# Patient Record
Sex: Female | Born: 1935
Health system: Southern US, Community
[De-identification: ages and names within clinical notes are randomized; demographics above are authoritative.]

## PROBLEM LIST (undated history)

## (undated) DIAGNOSIS — I639 Cerebral infarction, unspecified: Secondary | ICD-10-CM

## (undated) DIAGNOSIS — F0280 Dementia in other diseases classified elsewhere without behavioral disturbance: Secondary | ICD-10-CM

## (undated) DIAGNOSIS — I10 Essential (primary) hypertension: Secondary | ICD-10-CM

## (undated) DIAGNOSIS — F419 Anxiety disorder, unspecified: Secondary | ICD-10-CM

---

## 2010-07-06 ENCOUNTER — Encounter
Admission: RE | Admit: 2010-07-06 | Discharge: 2010-07-06 | Payer: Self-pay | Source: Home / Self Care | Attending: Obstetrics and Gynecology | Admitting: Obstetrics and Gynecology

## 2014-08-30 ENCOUNTER — Encounter (INDEPENDENT_AMBULATORY_CARE_PROVIDER_SITE_OTHER): Payer: Self-pay | Admitting: Ophthalmology

## 2014-10-25 ENCOUNTER — Encounter (INDEPENDENT_AMBULATORY_CARE_PROVIDER_SITE_OTHER): Payer: Self-pay | Admitting: Ophthalmology

## 2014-11-01 ENCOUNTER — Encounter (INDEPENDENT_AMBULATORY_CARE_PROVIDER_SITE_OTHER): Payer: Medicare Other | Admitting: Ophthalmology

## 2014-11-01 DIAGNOSIS — I10 Essential (primary) hypertension: Secondary | ICD-10-CM | POA: Diagnosis not present

## 2014-11-01 DIAGNOSIS — H43813 Vitreous degeneration, bilateral: Secondary | ICD-10-CM | POA: Diagnosis not present

## 2014-11-01 DIAGNOSIS — H35033 Hypertensive retinopathy, bilateral: Secondary | ICD-10-CM | POA: Diagnosis not present

## 2014-11-01 DIAGNOSIS — H34832 Tributary (branch) retinal vein occlusion, left eye: Secondary | ICD-10-CM | POA: Diagnosis not present

## 2014-11-01 DIAGNOSIS — D3131 Benign neoplasm of right choroid: Secondary | ICD-10-CM | POA: Diagnosis not present

## 2014-11-11 ENCOUNTER — Encounter (INDEPENDENT_AMBULATORY_CARE_PROVIDER_SITE_OTHER): Payer: Medicare Other | Admitting: Ophthalmology

## 2014-11-11 DIAGNOSIS — H34832 Tributary (branch) retinal vein occlusion, left eye: Secondary | ICD-10-CM | POA: Diagnosis not present

## 2014-11-11 DIAGNOSIS — H35032 Hypertensive retinopathy, left eye: Secondary | ICD-10-CM

## 2014-11-11 DIAGNOSIS — I10 Essential (primary) hypertension: Secondary | ICD-10-CM | POA: Diagnosis not present

## 2014-11-11 DIAGNOSIS — H43812 Vitreous degeneration, left eye: Secondary | ICD-10-CM

## 2015-03-14 ENCOUNTER — Ambulatory Visit (INDEPENDENT_AMBULATORY_CARE_PROVIDER_SITE_OTHER): Payer: Medicare Other | Admitting: Ophthalmology

## 2015-09-13 DIAGNOSIS — M810 Age-related osteoporosis without current pathological fracture: Secondary | ICD-10-CM | POA: Diagnosis not present

## 2015-09-22 DIAGNOSIS — Z789 Other specified health status: Secondary | ICD-10-CM | POA: Diagnosis not present

## 2015-09-22 DIAGNOSIS — Z299 Encounter for prophylactic measures, unspecified: Secondary | ICD-10-CM | POA: Diagnosis not present

## 2015-09-22 DIAGNOSIS — I10 Essential (primary) hypertension: Secondary | ICD-10-CM | POA: Diagnosis not present

## 2015-11-19 DIAGNOSIS — B029 Zoster without complications: Secondary | ICD-10-CM | POA: Diagnosis not present

## 2015-11-21 DIAGNOSIS — B029 Zoster without complications: Secondary | ICD-10-CM | POA: Diagnosis not present

## 2015-11-21 DIAGNOSIS — Z299 Encounter for prophylactic measures, unspecified: Secondary | ICD-10-CM | POA: Diagnosis not present

## 2016-01-04 DIAGNOSIS — Z299 Encounter for prophylactic measures, unspecified: Secondary | ICD-10-CM | POA: Diagnosis not present

## 2016-01-04 DIAGNOSIS — Z1211 Encounter for screening for malignant neoplasm of colon: Secondary | ICD-10-CM | POA: Diagnosis not present

## 2016-01-04 DIAGNOSIS — Z79899 Other long term (current) drug therapy: Secondary | ICD-10-CM | POA: Diagnosis not present

## 2016-01-04 DIAGNOSIS — Z6824 Body mass index (BMI) 24.0-24.9, adult: Secondary | ICD-10-CM | POA: Diagnosis not present

## 2016-01-04 DIAGNOSIS — Z0000 Encounter for general adult medical examination without abnormal findings: Secondary | ICD-10-CM | POA: Diagnosis not present

## 2016-01-04 DIAGNOSIS — Z7189 Other specified counseling: Secondary | ICD-10-CM | POA: Diagnosis not present

## 2016-01-04 DIAGNOSIS — R5383 Other fatigue: Secondary | ICD-10-CM | POA: Diagnosis not present

## 2016-01-04 DIAGNOSIS — D649 Anemia, unspecified: Secondary | ICD-10-CM | POA: Diagnosis not present

## 2016-01-04 DIAGNOSIS — Z1389 Encounter for screening for other disorder: Secondary | ICD-10-CM | POA: Diagnosis not present

## 2016-01-04 DIAGNOSIS — I10 Essential (primary) hypertension: Secondary | ICD-10-CM | POA: Diagnosis not present

## 2016-01-11 DIAGNOSIS — Z299 Encounter for prophylactic measures, unspecified: Secondary | ICD-10-CM | POA: Diagnosis not present

## 2016-01-11 DIAGNOSIS — R7303 Prediabetes: Secondary | ICD-10-CM | POA: Diagnosis not present

## 2016-01-11 DIAGNOSIS — E781 Pure hyperglyceridemia: Secondary | ICD-10-CM | POA: Diagnosis not present

## 2016-01-11 DIAGNOSIS — N289 Disorder of kidney and ureter, unspecified: Secondary | ICD-10-CM | POA: Diagnosis not present

## 2016-01-12 DIAGNOSIS — D3130 Benign neoplasm of unspecified choroid: Secondary | ICD-10-CM | POA: Diagnosis not present

## 2016-01-20 DIAGNOSIS — Z1231 Encounter for screening mammogram for malignant neoplasm of breast: Secondary | ICD-10-CM | POA: Diagnosis not present

## 2016-03-14 ENCOUNTER — Ambulatory Visit (INDEPENDENT_AMBULATORY_CARE_PROVIDER_SITE_OTHER): Payer: Medicare Other | Admitting: Ophthalmology

## 2016-03-29 ENCOUNTER — Ambulatory Visit (INDEPENDENT_AMBULATORY_CARE_PROVIDER_SITE_OTHER): Payer: Medicare Other | Admitting: Ophthalmology

## 2016-03-29 DIAGNOSIS — D3131 Benign neoplasm of right choroid: Secondary | ICD-10-CM

## 2016-03-29 DIAGNOSIS — H348322 Tributary (branch) retinal vein occlusion, left eye, stable: Secondary | ICD-10-CM | POA: Diagnosis not present

## 2016-03-29 DIAGNOSIS — H35033 Hypertensive retinopathy, bilateral: Secondary | ICD-10-CM | POA: Diagnosis not present

## 2016-03-29 DIAGNOSIS — H43813 Vitreous degeneration, bilateral: Secondary | ICD-10-CM | POA: Diagnosis not present

## 2016-03-29 DIAGNOSIS — I10 Essential (primary) hypertension: Secondary | ICD-10-CM | POA: Diagnosis not present

## 2016-04-05 DIAGNOSIS — Z90710 Acquired absence of both cervix and uterus: Secondary | ICD-10-CM | POA: Diagnosis not present

## 2016-04-05 DIAGNOSIS — I10 Essential (primary) hypertension: Secondary | ICD-10-CM | POA: Diagnosis not present

## 2016-04-05 DIAGNOSIS — E119 Type 2 diabetes mellitus without complications: Secondary | ICD-10-CM | POA: Diagnosis not present

## 2016-04-05 DIAGNOSIS — E7800 Pure hypercholesterolemia, unspecified: Secondary | ICD-10-CM | POA: Diagnosis not present

## 2016-07-10 DIAGNOSIS — M810 Age-related osteoporosis without current pathological fracture: Secondary | ICD-10-CM | POA: Diagnosis not present

## 2016-07-10 DIAGNOSIS — Z789 Other specified health status: Secondary | ICD-10-CM | POA: Diagnosis not present

## 2016-07-10 DIAGNOSIS — I10 Essential (primary) hypertension: Secondary | ICD-10-CM | POA: Diagnosis not present

## 2016-07-10 DIAGNOSIS — Z299 Encounter for prophylactic measures, unspecified: Secondary | ICD-10-CM | POA: Diagnosis not present

## 2016-07-10 DIAGNOSIS — E119 Type 2 diabetes mellitus without complications: Secondary | ICD-10-CM | POA: Diagnosis not present

## 2016-07-10 DIAGNOSIS — Z6824 Body mass index (BMI) 24.0-24.9, adult: Secondary | ICD-10-CM | POA: Diagnosis not present

## 2016-08-06 DIAGNOSIS — L250 Unspecified contact dermatitis due to cosmetics: Secondary | ICD-10-CM | POA: Diagnosis not present

## 2016-08-08 DIAGNOSIS — I10 Essential (primary) hypertension: Secondary | ICD-10-CM | POA: Diagnosis not present

## 2016-08-08 DIAGNOSIS — M810 Age-related osteoporosis without current pathological fracture: Secondary | ICD-10-CM | POA: Diagnosis not present

## 2016-08-08 DIAGNOSIS — E119 Type 2 diabetes mellitus without complications: Secondary | ICD-10-CM | POA: Diagnosis not present

## 2016-10-02 DIAGNOSIS — E119 Type 2 diabetes mellitus without complications: Secondary | ICD-10-CM | POA: Diagnosis not present

## 2016-10-02 DIAGNOSIS — I10 Essential (primary) hypertension: Secondary | ICD-10-CM | POA: Diagnosis not present

## 2016-10-02 DIAGNOSIS — M810 Age-related osteoporosis without current pathological fracture: Secondary | ICD-10-CM | POA: Diagnosis not present

## 2016-10-30 DIAGNOSIS — I10 Essential (primary) hypertension: Secondary | ICD-10-CM | POA: Diagnosis not present

## 2016-10-30 DIAGNOSIS — E119 Type 2 diabetes mellitus without complications: Secondary | ICD-10-CM | POA: Diagnosis not present

## 2016-10-30 DIAGNOSIS — M810 Age-related osteoporosis without current pathological fracture: Secondary | ICD-10-CM | POA: Diagnosis not present

## 2016-12-13 DIAGNOSIS — E119 Type 2 diabetes mellitus without complications: Secondary | ICD-10-CM | POA: Diagnosis not present

## 2016-12-13 DIAGNOSIS — I10 Essential (primary) hypertension: Secondary | ICD-10-CM | POA: Diagnosis not present

## 2016-12-13 DIAGNOSIS — M810 Age-related osteoporosis without current pathological fracture: Secondary | ICD-10-CM | POA: Diagnosis not present

## 2017-01-07 DIAGNOSIS — R5383 Other fatigue: Secondary | ICD-10-CM | POA: Diagnosis not present

## 2017-01-07 DIAGNOSIS — E119 Type 2 diabetes mellitus without complications: Secondary | ICD-10-CM | POA: Diagnosis not present

## 2017-01-07 DIAGNOSIS — Z7189 Other specified counseling: Secondary | ICD-10-CM | POA: Diagnosis not present

## 2017-01-07 DIAGNOSIS — Z0000 Encounter for general adult medical examination without abnormal findings: Secondary | ICD-10-CM | POA: Diagnosis not present

## 2017-01-07 DIAGNOSIS — Z1231 Encounter for screening mammogram for malignant neoplasm of breast: Secondary | ICD-10-CM | POA: Diagnosis not present

## 2017-01-07 DIAGNOSIS — Z1389 Encounter for screening for other disorder: Secondary | ICD-10-CM | POA: Diagnosis not present

## 2017-01-07 DIAGNOSIS — Z1211 Encounter for screening for malignant neoplasm of colon: Secondary | ICD-10-CM | POA: Diagnosis not present

## 2017-01-07 DIAGNOSIS — Z299 Encounter for prophylactic measures, unspecified: Secondary | ICD-10-CM | POA: Diagnosis not present

## 2017-01-07 DIAGNOSIS — E7800 Pure hypercholesterolemia, unspecified: Secondary | ICD-10-CM | POA: Diagnosis not present

## 2017-01-07 DIAGNOSIS — Z79899 Other long term (current) drug therapy: Secondary | ICD-10-CM | POA: Diagnosis not present

## 2017-01-07 DIAGNOSIS — I10 Essential (primary) hypertension: Secondary | ICD-10-CM | POA: Diagnosis not present

## 2017-01-07 DIAGNOSIS — E663 Overweight: Secondary | ICD-10-CM | POA: Diagnosis not present

## 2017-01-08 DIAGNOSIS — E663 Overweight: Secondary | ICD-10-CM | POA: Diagnosis not present

## 2017-01-08 DIAGNOSIS — E7800 Pure hypercholesterolemia, unspecified: Secondary | ICD-10-CM | POA: Diagnosis not present

## 2017-01-08 DIAGNOSIS — I10 Essential (primary) hypertension: Secondary | ICD-10-CM | POA: Diagnosis not present

## 2017-01-08 DIAGNOSIS — M810 Age-related osteoporosis without current pathological fracture: Secondary | ICD-10-CM | POA: Diagnosis not present

## 2017-01-08 DIAGNOSIS — N183 Chronic kidney disease, stage 3 (moderate): Secondary | ICD-10-CM | POA: Diagnosis not present

## 2017-01-08 DIAGNOSIS — Z299 Encounter for prophylactic measures, unspecified: Secondary | ICD-10-CM | POA: Diagnosis not present

## 2017-01-08 DIAGNOSIS — E119 Type 2 diabetes mellitus without complications: Secondary | ICD-10-CM | POA: Diagnosis not present

## 2017-01-17 DIAGNOSIS — I10 Essential (primary) hypertension: Secondary | ICD-10-CM | POA: Diagnosis not present

## 2017-01-17 DIAGNOSIS — M810 Age-related osteoporosis without current pathological fracture: Secondary | ICD-10-CM | POA: Diagnosis not present

## 2017-01-17 DIAGNOSIS — E119 Type 2 diabetes mellitus without complications: Secondary | ICD-10-CM | POA: Diagnosis not present

## 2017-02-05 DIAGNOSIS — N183 Chronic kidney disease, stage 3 (moderate): Secondary | ICD-10-CM | POA: Diagnosis not present

## 2017-02-25 DIAGNOSIS — I10 Essential (primary) hypertension: Secondary | ICD-10-CM | POA: Diagnosis not present

## 2017-02-25 DIAGNOSIS — M810 Age-related osteoporosis without current pathological fracture: Secondary | ICD-10-CM | POA: Diagnosis not present

## 2017-02-25 DIAGNOSIS — E119 Type 2 diabetes mellitus without complications: Secondary | ICD-10-CM | POA: Diagnosis not present

## 2017-03-13 DIAGNOSIS — M810 Age-related osteoporosis without current pathological fracture: Secondary | ICD-10-CM | POA: Diagnosis not present

## 2017-03-13 DIAGNOSIS — I10 Essential (primary) hypertension: Secondary | ICD-10-CM | POA: Diagnosis not present

## 2017-03-13 DIAGNOSIS — E119 Type 2 diabetes mellitus without complications: Secondary | ICD-10-CM | POA: Diagnosis not present

## 2017-03-25 DIAGNOSIS — Z1231 Encounter for screening mammogram for malignant neoplasm of breast: Secondary | ICD-10-CM | POA: Diagnosis not present

## 2017-04-08 ENCOUNTER — Ambulatory Visit (INDEPENDENT_AMBULATORY_CARE_PROVIDER_SITE_OTHER): Payer: Medicare Other | Admitting: Ophthalmology

## 2017-04-08 DIAGNOSIS — H34832 Tributary (branch) retinal vein occlusion, left eye, with macular edema: Secondary | ICD-10-CM | POA: Diagnosis not present

## 2017-04-08 DIAGNOSIS — H43813 Vitreous degeneration, bilateral: Secondary | ICD-10-CM | POA: Diagnosis not present

## 2017-04-08 DIAGNOSIS — I10 Essential (primary) hypertension: Secondary | ICD-10-CM

## 2017-04-08 DIAGNOSIS — D3131 Benign neoplasm of right choroid: Secondary | ICD-10-CM | POA: Diagnosis not present

## 2017-04-08 DIAGNOSIS — H353111 Nonexudative age-related macular degeneration, right eye, early dry stage: Secondary | ICD-10-CM | POA: Diagnosis not present

## 2017-04-08 DIAGNOSIS — H35033 Hypertensive retinopathy, bilateral: Secondary | ICD-10-CM | POA: Diagnosis not present

## 2017-04-15 DIAGNOSIS — E7800 Pure hypercholesterolemia, unspecified: Secondary | ICD-10-CM | POA: Diagnosis not present

## 2017-04-15 DIAGNOSIS — E1165 Type 2 diabetes mellitus with hyperglycemia: Secondary | ICD-10-CM | POA: Diagnosis not present

## 2017-04-15 DIAGNOSIS — N183 Chronic kidney disease, stage 3 (moderate): Secondary | ICD-10-CM | POA: Diagnosis not present

## 2017-04-15 DIAGNOSIS — Z299 Encounter for prophylactic measures, unspecified: Secondary | ICD-10-CM | POA: Diagnosis not present

## 2017-04-15 DIAGNOSIS — Z6826 Body mass index (BMI) 26.0-26.9, adult: Secondary | ICD-10-CM | POA: Diagnosis not present

## 2017-04-15 DIAGNOSIS — I10 Essential (primary) hypertension: Secondary | ICD-10-CM | POA: Diagnosis not present

## 2017-04-15 DIAGNOSIS — M810 Age-related osteoporosis without current pathological fracture: Secondary | ICD-10-CM | POA: Diagnosis not present

## 2017-04-25 DIAGNOSIS — Z23 Encounter for immunization: Secondary | ICD-10-CM | POA: Diagnosis not present

## 2017-05-10 DIAGNOSIS — M810 Age-related osteoporosis without current pathological fracture: Secondary | ICD-10-CM | POA: Diagnosis not present

## 2017-05-10 DIAGNOSIS — I10 Essential (primary) hypertension: Secondary | ICD-10-CM | POA: Diagnosis not present

## 2017-05-10 DIAGNOSIS — E119 Type 2 diabetes mellitus without complications: Secondary | ICD-10-CM | POA: Diagnosis not present

## 2017-06-04 DIAGNOSIS — E119 Type 2 diabetes mellitus without complications: Secondary | ICD-10-CM | POA: Diagnosis not present

## 2017-06-04 DIAGNOSIS — M810 Age-related osteoporosis without current pathological fracture: Secondary | ICD-10-CM | POA: Diagnosis not present

## 2017-06-04 DIAGNOSIS — I10 Essential (primary) hypertension: Secondary | ICD-10-CM | POA: Diagnosis not present

## 2017-07-04 DIAGNOSIS — M810 Age-related osteoporosis without current pathological fracture: Secondary | ICD-10-CM | POA: Diagnosis not present

## 2017-07-04 DIAGNOSIS — I10 Essential (primary) hypertension: Secondary | ICD-10-CM | POA: Diagnosis not present

## 2017-07-04 DIAGNOSIS — E119 Type 2 diabetes mellitus without complications: Secondary | ICD-10-CM | POA: Diagnosis not present

## 2017-07-22 DIAGNOSIS — Z6826 Body mass index (BMI) 26.0-26.9, adult: Secondary | ICD-10-CM | POA: Diagnosis not present

## 2017-07-22 DIAGNOSIS — E1165 Type 2 diabetes mellitus with hyperglycemia: Secondary | ICD-10-CM | POA: Diagnosis not present

## 2017-07-22 DIAGNOSIS — Z299 Encounter for prophylactic measures, unspecified: Secondary | ICD-10-CM | POA: Diagnosis not present

## 2017-07-22 DIAGNOSIS — E1122 Type 2 diabetes mellitus with diabetic chronic kidney disease: Secondary | ICD-10-CM | POA: Diagnosis not present

## 2017-07-22 DIAGNOSIS — I10 Essential (primary) hypertension: Secondary | ICD-10-CM | POA: Diagnosis not present

## 2017-07-22 DIAGNOSIS — N183 Chronic kidney disease, stage 3 (moderate): Secondary | ICD-10-CM | POA: Diagnosis not present

## 2017-07-30 DIAGNOSIS — I10 Essential (primary) hypertension: Secondary | ICD-10-CM | POA: Diagnosis not present

## 2017-07-30 DIAGNOSIS — Z6827 Body mass index (BMI) 27.0-27.9, adult: Secondary | ICD-10-CM | POA: Diagnosis not present

## 2017-07-30 DIAGNOSIS — Z299 Encounter for prophylactic measures, unspecified: Secondary | ICD-10-CM | POA: Diagnosis not present

## 2017-07-30 DIAGNOSIS — Z789 Other specified health status: Secondary | ICD-10-CM | POA: Diagnosis not present

## 2017-07-30 DIAGNOSIS — E7800 Pure hypercholesterolemia, unspecified: Secondary | ICD-10-CM | POA: Diagnosis not present

## 2017-07-30 DIAGNOSIS — N183 Chronic kidney disease, stage 3 (moderate): Secondary | ICD-10-CM | POA: Diagnosis not present

## 2017-07-30 DIAGNOSIS — E1122 Type 2 diabetes mellitus with diabetic chronic kidney disease: Secondary | ICD-10-CM | POA: Diagnosis not present

## 2017-07-30 DIAGNOSIS — J320 Chronic maxillary sinusitis: Secondary | ICD-10-CM | POA: Diagnosis not present

## 2017-08-05 DIAGNOSIS — E119 Type 2 diabetes mellitus without complications: Secondary | ICD-10-CM | POA: Diagnosis not present

## 2017-08-05 DIAGNOSIS — I10 Essential (primary) hypertension: Secondary | ICD-10-CM | POA: Diagnosis not present

## 2017-08-05 DIAGNOSIS — M810 Age-related osteoporosis without current pathological fracture: Secondary | ICD-10-CM | POA: Diagnosis not present

## 2017-09-03 DIAGNOSIS — E119 Type 2 diabetes mellitus without complications: Secondary | ICD-10-CM | POA: Diagnosis not present

## 2017-09-03 DIAGNOSIS — M810 Age-related osteoporosis without current pathological fracture: Secondary | ICD-10-CM | POA: Diagnosis not present

## 2017-09-03 DIAGNOSIS — I10 Essential (primary) hypertension: Secondary | ICD-10-CM | POA: Diagnosis not present

## 2017-09-30 DIAGNOSIS — M810 Age-related osteoporosis without current pathological fracture: Secondary | ICD-10-CM | POA: Diagnosis not present

## 2017-09-30 DIAGNOSIS — I10 Essential (primary) hypertension: Secondary | ICD-10-CM | POA: Diagnosis not present

## 2017-09-30 DIAGNOSIS — E119 Type 2 diabetes mellitus without complications: Secondary | ICD-10-CM | POA: Diagnosis not present

## 2017-10-24 DIAGNOSIS — I10 Essential (primary) hypertension: Secondary | ICD-10-CM | POA: Diagnosis not present

## 2017-10-24 DIAGNOSIS — J069 Acute upper respiratory infection, unspecified: Secondary | ICD-10-CM | POA: Diagnosis not present

## 2017-10-24 DIAGNOSIS — Z299 Encounter for prophylactic measures, unspecified: Secondary | ICD-10-CM | POA: Diagnosis not present

## 2017-10-24 DIAGNOSIS — Z789 Other specified health status: Secondary | ICD-10-CM | POA: Diagnosis not present

## 2017-10-24 DIAGNOSIS — Z6827 Body mass index (BMI) 27.0-27.9, adult: Secondary | ICD-10-CM | POA: Diagnosis not present

## 2017-11-28 DIAGNOSIS — I10 Essential (primary) hypertension: Secondary | ICD-10-CM | POA: Diagnosis not present

## 2017-11-28 DIAGNOSIS — E119 Type 2 diabetes mellitus without complications: Secondary | ICD-10-CM | POA: Diagnosis not present

## 2017-11-28 DIAGNOSIS — M810 Age-related osteoporosis without current pathological fracture: Secondary | ICD-10-CM | POA: Diagnosis not present

## 2017-12-27 DIAGNOSIS — I10 Essential (primary) hypertension: Secondary | ICD-10-CM | POA: Diagnosis not present

## 2017-12-27 DIAGNOSIS — Z79899 Other long term (current) drug therapy: Secondary | ICD-10-CM | POA: Diagnosis not present

## 2017-12-27 DIAGNOSIS — E7800 Pure hypercholesterolemia, unspecified: Secondary | ICD-10-CM | POA: Diagnosis not present

## 2017-12-27 DIAGNOSIS — G4489 Other headache syndrome: Secondary | ICD-10-CM | POA: Diagnosis not present

## 2017-12-27 DIAGNOSIS — R52 Pain, unspecified: Secondary | ICD-10-CM | POA: Diagnosis not present

## 2017-12-27 DIAGNOSIS — Z886 Allergy status to analgesic agent status: Secondary | ICD-10-CM | POA: Diagnosis not present

## 2017-12-27 DIAGNOSIS — R29818 Other symptoms and signs involving the nervous system: Secondary | ICD-10-CM | POA: Diagnosis not present

## 2017-12-27 DIAGNOSIS — Z808 Family history of malignant neoplasm of other organs or systems: Secondary | ICD-10-CM | POA: Diagnosis not present

## 2017-12-27 DIAGNOSIS — R51 Headache: Secondary | ICD-10-CM | POA: Diagnosis not present

## 2017-12-27 DIAGNOSIS — R410 Disorientation, unspecified: Secondary | ICD-10-CM | POA: Diagnosis not present

## 2017-12-27 DIAGNOSIS — E119 Type 2 diabetes mellitus without complications: Secondary | ICD-10-CM | POA: Diagnosis not present

## 2017-12-27 DIAGNOSIS — Z823 Family history of stroke: Secondary | ICD-10-CM | POA: Diagnosis not present

## 2017-12-27 DIAGNOSIS — Z818 Family history of other mental and behavioral disorders: Secondary | ICD-10-CM | POA: Diagnosis not present

## 2017-12-27 DIAGNOSIS — G459 Transient cerebral ischemic attack, unspecified: Secondary | ICD-10-CM | POA: Diagnosis not present

## 2017-12-27 DIAGNOSIS — Z90710 Acquired absence of both cervix and uterus: Secondary | ICD-10-CM | POA: Diagnosis not present

## 2017-12-27 DIAGNOSIS — Z883 Allergy status to other anti-infective agents status: Secondary | ICD-10-CM | POA: Diagnosis not present

## 2017-12-27 DIAGNOSIS — N289 Disorder of kidney and ureter, unspecified: Secondary | ICD-10-CM | POA: Diagnosis not present

## 2017-12-27 DIAGNOSIS — M810 Age-related osteoporosis without current pathological fracture: Secondary | ICD-10-CM | POA: Diagnosis not present

## 2017-12-27 DIAGNOSIS — N189 Chronic kidney disease, unspecified: Secondary | ICD-10-CM | POA: Diagnosis not present

## 2017-12-27 DIAGNOSIS — I129 Hypertensive chronic kidney disease with stage 1 through stage 4 chronic kidney disease, or unspecified chronic kidney disease: Secondary | ICD-10-CM | POA: Diagnosis not present

## 2017-12-27 DIAGNOSIS — Z881 Allergy status to other antibiotic agents status: Secondary | ICD-10-CM | POA: Diagnosis not present

## 2017-12-28 DIAGNOSIS — G459 Transient cerebral ischemic attack, unspecified: Secondary | ICD-10-CM | POA: Diagnosis not present

## 2017-12-28 DIAGNOSIS — N189 Chronic kidney disease, unspecified: Secondary | ICD-10-CM | POA: Diagnosis not present

## 2017-12-28 DIAGNOSIS — I129 Hypertensive chronic kidney disease with stage 1 through stage 4 chronic kidney disease, or unspecified chronic kidney disease: Secondary | ICD-10-CM | POA: Diagnosis not present

## 2017-12-28 DIAGNOSIS — R51 Headache: Secondary | ICD-10-CM | POA: Diagnosis not present

## 2017-12-30 DIAGNOSIS — E1165 Type 2 diabetes mellitus with hyperglycemia: Secondary | ICD-10-CM | POA: Diagnosis not present

## 2017-12-30 DIAGNOSIS — G459 Transient cerebral ischemic attack, unspecified: Secondary | ICD-10-CM | POA: Diagnosis not present

## 2017-12-30 DIAGNOSIS — Z09 Encounter for follow-up examination after completed treatment for conditions other than malignant neoplasm: Secondary | ICD-10-CM | POA: Diagnosis not present

## 2017-12-30 DIAGNOSIS — Z6828 Body mass index (BMI) 28.0-28.9, adult: Secondary | ICD-10-CM | POA: Diagnosis not present

## 2017-12-30 DIAGNOSIS — I10 Essential (primary) hypertension: Secondary | ICD-10-CM | POA: Diagnosis not present

## 2018-01-01 DIAGNOSIS — R413 Other amnesia: Secondary | ICD-10-CM | POA: Diagnosis not present

## 2018-01-01 DIAGNOSIS — R531 Weakness: Secondary | ICD-10-CM | POA: Diagnosis not present

## 2018-01-01 DIAGNOSIS — I10 Essential (primary) hypertension: Secondary | ICD-10-CM | POA: Diagnosis not present

## 2018-01-01 DIAGNOSIS — G459 Transient cerebral ischemic attack, unspecified: Secondary | ICD-10-CM | POA: Diagnosis not present

## 2018-01-06 DIAGNOSIS — I6523 Occlusion and stenosis of bilateral carotid arteries: Secondary | ICD-10-CM | POA: Diagnosis not present

## 2018-01-06 DIAGNOSIS — G459 Transient cerebral ischemic attack, unspecified: Secondary | ICD-10-CM | POA: Diagnosis not present

## 2018-01-07 DIAGNOSIS — Z6828 Body mass index (BMI) 28.0-28.9, adult: Secondary | ICD-10-CM | POA: Diagnosis not present

## 2018-01-07 DIAGNOSIS — E1165 Type 2 diabetes mellitus with hyperglycemia: Secondary | ICD-10-CM | POA: Diagnosis not present

## 2018-01-07 DIAGNOSIS — I10 Essential (primary) hypertension: Secondary | ICD-10-CM | POA: Diagnosis not present

## 2018-01-07 DIAGNOSIS — M19072 Primary osteoarthritis, left ankle and foot: Secondary | ICD-10-CM | POA: Diagnosis not present

## 2018-01-07 DIAGNOSIS — Z299 Encounter for prophylactic measures, unspecified: Secondary | ICD-10-CM | POA: Diagnosis not present

## 2018-01-07 DIAGNOSIS — I70208 Unspecified atherosclerosis of native arteries of extremities, other extremity: Secondary | ICD-10-CM | POA: Diagnosis not present

## 2018-01-07 DIAGNOSIS — N183 Chronic kidney disease, stage 3 (moderate): Secondary | ICD-10-CM | POA: Diagnosis not present

## 2018-01-07 DIAGNOSIS — M79673 Pain in unspecified foot: Secondary | ICD-10-CM | POA: Diagnosis not present

## 2018-01-07 DIAGNOSIS — M79672 Pain in left foot: Secondary | ICD-10-CM | POA: Diagnosis not present

## 2018-01-07 DIAGNOSIS — E1122 Type 2 diabetes mellitus with diabetic chronic kidney disease: Secondary | ICD-10-CM | POA: Diagnosis not present

## 2018-01-07 DIAGNOSIS — M7732 Calcaneal spur, left foot: Secondary | ICD-10-CM | POA: Diagnosis not present

## 2018-01-13 DIAGNOSIS — E1165 Type 2 diabetes mellitus with hyperglycemia: Secondary | ICD-10-CM | POA: Diagnosis not present

## 2018-01-13 DIAGNOSIS — Z0000 Encounter for general adult medical examination without abnormal findings: Secondary | ICD-10-CM | POA: Diagnosis not present

## 2018-01-13 DIAGNOSIS — Z1331 Encounter for screening for depression: Secondary | ICD-10-CM | POA: Diagnosis not present

## 2018-01-13 DIAGNOSIS — Z299 Encounter for prophylactic measures, unspecified: Secondary | ICD-10-CM | POA: Diagnosis not present

## 2018-01-13 DIAGNOSIS — Z6828 Body mass index (BMI) 28.0-28.9, adult: Secondary | ICD-10-CM | POA: Diagnosis not present

## 2018-01-13 DIAGNOSIS — R5383 Other fatigue: Secondary | ICD-10-CM | POA: Diagnosis not present

## 2018-01-13 DIAGNOSIS — Z1339 Encounter for screening examination for other mental health and behavioral disorders: Secondary | ICD-10-CM | POA: Diagnosis not present

## 2018-01-13 DIAGNOSIS — Z1211 Encounter for screening for malignant neoplasm of colon: Secondary | ICD-10-CM | POA: Diagnosis not present

## 2018-01-13 DIAGNOSIS — Z79899 Other long term (current) drug therapy: Secondary | ICD-10-CM | POA: Diagnosis not present

## 2018-01-13 DIAGNOSIS — Z7189 Other specified counseling: Secondary | ICD-10-CM | POA: Diagnosis not present

## 2018-01-13 DIAGNOSIS — E7800 Pure hypercholesterolemia, unspecified: Secondary | ICD-10-CM | POA: Diagnosis not present

## 2018-01-22 DIAGNOSIS — Z6828 Body mass index (BMI) 28.0-28.9, adult: Secondary | ICD-10-CM | POA: Diagnosis not present

## 2018-01-22 DIAGNOSIS — N183 Chronic kidney disease, stage 3 (moderate): Secondary | ICD-10-CM | POA: Diagnosis not present

## 2018-01-22 DIAGNOSIS — Z299 Encounter for prophylactic measures, unspecified: Secondary | ICD-10-CM | POA: Diagnosis not present

## 2018-01-22 DIAGNOSIS — E1165 Type 2 diabetes mellitus with hyperglycemia: Secondary | ICD-10-CM | POA: Diagnosis not present

## 2018-01-22 DIAGNOSIS — E1122 Type 2 diabetes mellitus with diabetic chronic kidney disease: Secondary | ICD-10-CM | POA: Diagnosis not present

## 2018-01-22 DIAGNOSIS — I10 Essential (primary) hypertension: Secondary | ICD-10-CM | POA: Diagnosis not present

## 2018-01-23 DIAGNOSIS — I10 Essential (primary) hypertension: Secondary | ICD-10-CM | POA: Diagnosis not present

## 2018-01-23 DIAGNOSIS — E119 Type 2 diabetes mellitus without complications: Secondary | ICD-10-CM | POA: Diagnosis not present

## 2018-01-23 DIAGNOSIS — M810 Age-related osteoporosis without current pathological fracture: Secondary | ICD-10-CM | POA: Diagnosis not present

## 2018-01-31 DIAGNOSIS — I10 Essential (primary) hypertension: Secondary | ICD-10-CM | POA: Diagnosis not present

## 2018-01-31 DIAGNOSIS — Z299 Encounter for prophylactic measures, unspecified: Secondary | ICD-10-CM | POA: Diagnosis not present

## 2018-01-31 DIAGNOSIS — Z713 Dietary counseling and surveillance: Secondary | ICD-10-CM | POA: Diagnosis not present

## 2018-01-31 DIAGNOSIS — Z6828 Body mass index (BMI) 28.0-28.9, adult: Secondary | ICD-10-CM | POA: Diagnosis not present

## 2018-02-07 DIAGNOSIS — I10 Essential (primary) hypertension: Secondary | ICD-10-CM | POA: Diagnosis not present

## 2018-02-07 DIAGNOSIS — N183 Chronic kidney disease, stage 3 (moderate): Secondary | ICD-10-CM | POA: Diagnosis not present

## 2018-02-07 DIAGNOSIS — E7800 Pure hypercholesterolemia, unspecified: Secondary | ICD-10-CM | POA: Diagnosis not present

## 2018-02-07 DIAGNOSIS — E1165 Type 2 diabetes mellitus with hyperglycemia: Secondary | ICD-10-CM | POA: Diagnosis not present

## 2018-02-07 DIAGNOSIS — Z6828 Body mass index (BMI) 28.0-28.9, adult: Secondary | ICD-10-CM | POA: Diagnosis not present

## 2018-02-07 DIAGNOSIS — Z299 Encounter for prophylactic measures, unspecified: Secondary | ICD-10-CM | POA: Diagnosis not present

## 2018-02-18 DIAGNOSIS — I10 Essential (primary) hypertension: Secondary | ICD-10-CM | POA: Diagnosis not present

## 2018-02-18 DIAGNOSIS — E119 Type 2 diabetes mellitus without complications: Secondary | ICD-10-CM | POA: Diagnosis not present

## 2018-02-18 DIAGNOSIS — M810 Age-related osteoporosis without current pathological fracture: Secondary | ICD-10-CM | POA: Diagnosis not present

## 2018-03-18 DIAGNOSIS — M810 Age-related osteoporosis without current pathological fracture: Secondary | ICD-10-CM | POA: Diagnosis not present

## 2018-03-18 DIAGNOSIS — I10 Essential (primary) hypertension: Secondary | ICD-10-CM | POA: Diagnosis not present

## 2018-03-18 DIAGNOSIS — E119 Type 2 diabetes mellitus without complications: Secondary | ICD-10-CM | POA: Diagnosis not present

## 2018-03-21 DIAGNOSIS — E7800 Pure hypercholesterolemia, unspecified: Secondary | ICD-10-CM | POA: Diagnosis not present

## 2018-03-21 DIAGNOSIS — I10 Essential (primary) hypertension: Secondary | ICD-10-CM | POA: Diagnosis not present

## 2018-03-21 DIAGNOSIS — Z713 Dietary counseling and surveillance: Secondary | ICD-10-CM | POA: Diagnosis not present

## 2018-03-21 DIAGNOSIS — Z299 Encounter for prophylactic measures, unspecified: Secondary | ICD-10-CM | POA: Diagnosis not present

## 2018-03-21 DIAGNOSIS — Z6828 Body mass index (BMI) 28.0-28.9, adult: Secondary | ICD-10-CM | POA: Diagnosis not present

## 2018-04-08 ENCOUNTER — Encounter (INDEPENDENT_AMBULATORY_CARE_PROVIDER_SITE_OTHER): Payer: Medicare Other | Admitting: Ophthalmology

## 2018-04-08 DIAGNOSIS — I10 Essential (primary) hypertension: Secondary | ICD-10-CM

## 2018-04-08 DIAGNOSIS — H35033 Hypertensive retinopathy, bilateral: Secondary | ICD-10-CM | POA: Diagnosis not present

## 2018-04-08 DIAGNOSIS — D3131 Benign neoplasm of right choroid: Secondary | ICD-10-CM | POA: Diagnosis not present

## 2018-04-08 DIAGNOSIS — H43813 Vitreous degeneration, bilateral: Secondary | ICD-10-CM

## 2018-04-08 DIAGNOSIS — H34832 Tributary (branch) retinal vein occlusion, left eye, with macular edema: Secondary | ICD-10-CM | POA: Diagnosis not present

## 2018-04-10 DIAGNOSIS — M810 Age-related osteoporosis without current pathological fracture: Secondary | ICD-10-CM | POA: Diagnosis not present

## 2018-04-10 DIAGNOSIS — I10 Essential (primary) hypertension: Secondary | ICD-10-CM | POA: Diagnosis not present

## 2018-04-10 DIAGNOSIS — E119 Type 2 diabetes mellitus without complications: Secondary | ICD-10-CM | POA: Diagnosis not present

## 2018-05-12 DIAGNOSIS — M810 Age-related osteoporosis without current pathological fracture: Secondary | ICD-10-CM | POA: Diagnosis not present

## 2018-05-12 DIAGNOSIS — I10 Essential (primary) hypertension: Secondary | ICD-10-CM | POA: Diagnosis not present

## 2018-05-12 DIAGNOSIS — E119 Type 2 diabetes mellitus without complications: Secondary | ICD-10-CM | POA: Diagnosis not present

## 2018-05-13 DIAGNOSIS — N183 Chronic kidney disease, stage 3 (moderate): Secondary | ICD-10-CM | POA: Diagnosis not present

## 2018-05-13 DIAGNOSIS — E1165 Type 2 diabetes mellitus with hyperglycemia: Secondary | ICD-10-CM | POA: Diagnosis not present

## 2018-05-13 DIAGNOSIS — Z6828 Body mass index (BMI) 28.0-28.9, adult: Secondary | ICD-10-CM | POA: Diagnosis not present

## 2018-05-13 DIAGNOSIS — E7800 Pure hypercholesterolemia, unspecified: Secondary | ICD-10-CM | POA: Diagnosis not present

## 2018-05-13 DIAGNOSIS — I10 Essential (primary) hypertension: Secondary | ICD-10-CM | POA: Diagnosis not present

## 2018-05-13 DIAGNOSIS — Z299 Encounter for prophylactic measures, unspecified: Secondary | ICD-10-CM | POA: Diagnosis not present

## 2018-07-01 DIAGNOSIS — E119 Type 2 diabetes mellitus without complications: Secondary | ICD-10-CM | POA: Diagnosis not present

## 2018-07-01 DIAGNOSIS — M810 Age-related osteoporosis without current pathological fracture: Secondary | ICD-10-CM | POA: Diagnosis not present

## 2018-07-01 DIAGNOSIS — I10 Essential (primary) hypertension: Secondary | ICD-10-CM | POA: Diagnosis not present

## 2018-07-15 DIAGNOSIS — Z23 Encounter for immunization: Secondary | ICD-10-CM | POA: Diagnosis not present

## 2018-08-14 DIAGNOSIS — E1165 Type 2 diabetes mellitus with hyperglycemia: Secondary | ICD-10-CM | POA: Diagnosis not present

## 2018-08-14 DIAGNOSIS — Z299 Encounter for prophylactic measures, unspecified: Secondary | ICD-10-CM | POA: Diagnosis not present

## 2018-08-14 DIAGNOSIS — E7800 Pure hypercholesterolemia, unspecified: Secondary | ICD-10-CM | POA: Diagnosis not present

## 2018-08-14 DIAGNOSIS — Z789 Other specified health status: Secondary | ICD-10-CM | POA: Diagnosis not present

## 2018-08-14 DIAGNOSIS — I10 Essential (primary) hypertension: Secondary | ICD-10-CM | POA: Diagnosis not present

## 2018-08-14 DIAGNOSIS — Z6829 Body mass index (BMI) 29.0-29.9, adult: Secondary | ICD-10-CM | POA: Diagnosis not present

## 2018-08-14 DIAGNOSIS — R413 Other amnesia: Secondary | ICD-10-CM | POA: Diagnosis not present

## 2018-08-15 DIAGNOSIS — I6782 Cerebral ischemia: Secondary | ICD-10-CM | POA: Diagnosis not present

## 2018-08-15 DIAGNOSIS — R413 Other amnesia: Secondary | ICD-10-CM | POA: Diagnosis not present

## 2018-08-15 DIAGNOSIS — F068 Other specified mental disorders due to known physiological condition: Secondary | ICD-10-CM | POA: Diagnosis not present

## 2018-08-29 DIAGNOSIS — I10 Essential (primary) hypertension: Secondary | ICD-10-CM | POA: Diagnosis not present

## 2018-08-29 DIAGNOSIS — M810 Age-related osteoporosis without current pathological fracture: Secondary | ICD-10-CM | POA: Diagnosis not present

## 2018-08-29 DIAGNOSIS — E119 Type 2 diabetes mellitus without complications: Secondary | ICD-10-CM | POA: Diagnosis not present

## 2018-09-25 DIAGNOSIS — I10 Essential (primary) hypertension: Secondary | ICD-10-CM | POA: Diagnosis not present

## 2018-09-25 DIAGNOSIS — E119 Type 2 diabetes mellitus without complications: Secondary | ICD-10-CM | POA: Diagnosis not present

## 2018-09-25 DIAGNOSIS — M810 Age-related osteoporosis without current pathological fracture: Secondary | ICD-10-CM | POA: Diagnosis not present

## 2018-10-23 DIAGNOSIS — I10 Essential (primary) hypertension: Secondary | ICD-10-CM | POA: Diagnosis not present

## 2018-10-23 DIAGNOSIS — M810 Age-related osteoporosis without current pathological fracture: Secondary | ICD-10-CM | POA: Diagnosis not present

## 2018-10-23 DIAGNOSIS — E119 Type 2 diabetes mellitus without complications: Secondary | ICD-10-CM | POA: Diagnosis not present

## 2018-11-19 DIAGNOSIS — I10 Essential (primary) hypertension: Secondary | ICD-10-CM | POA: Diagnosis not present

## 2018-11-19 DIAGNOSIS — M810 Age-related osteoporosis without current pathological fracture: Secondary | ICD-10-CM | POA: Diagnosis not present

## 2018-11-19 DIAGNOSIS — E119 Type 2 diabetes mellitus without complications: Secondary | ICD-10-CM | POA: Diagnosis not present

## 2019-01-05 DIAGNOSIS — M810 Age-related osteoporosis without current pathological fracture: Secondary | ICD-10-CM | POA: Diagnosis not present

## 2019-01-05 DIAGNOSIS — E119 Type 2 diabetes mellitus without complications: Secondary | ICD-10-CM | POA: Diagnosis not present

## 2019-01-05 DIAGNOSIS — I10 Essential (primary) hypertension: Secondary | ICD-10-CM | POA: Diagnosis not present

## 2019-01-19 DIAGNOSIS — R5383 Other fatigue: Secondary | ICD-10-CM | POA: Diagnosis not present

## 2019-01-19 DIAGNOSIS — E1165 Type 2 diabetes mellitus with hyperglycemia: Secondary | ICD-10-CM | POA: Diagnosis not present

## 2019-01-19 DIAGNOSIS — Z1331 Encounter for screening for depression: Secondary | ICD-10-CM | POA: Diagnosis not present

## 2019-01-19 DIAGNOSIS — Z6829 Body mass index (BMI) 29.0-29.9, adult: Secondary | ICD-10-CM | POA: Diagnosis not present

## 2019-01-19 DIAGNOSIS — Z7189 Other specified counseling: Secondary | ICD-10-CM | POA: Diagnosis not present

## 2019-01-19 DIAGNOSIS — Z299 Encounter for prophylactic measures, unspecified: Secondary | ICD-10-CM | POA: Diagnosis not present

## 2019-01-19 DIAGNOSIS — Z0000 Encounter for general adult medical examination without abnormal findings: Secondary | ICD-10-CM | POA: Diagnosis not present

## 2019-01-19 DIAGNOSIS — Z1339 Encounter for screening examination for other mental health and behavioral disorders: Secondary | ICD-10-CM | POA: Diagnosis not present

## 2019-01-19 DIAGNOSIS — I10 Essential (primary) hypertension: Secondary | ICD-10-CM | POA: Diagnosis not present

## 2019-01-19 DIAGNOSIS — E7800 Pure hypercholesterolemia, unspecified: Secondary | ICD-10-CM | POA: Diagnosis not present

## 2019-01-19 DIAGNOSIS — Z79899 Other long term (current) drug therapy: Secondary | ICD-10-CM | POA: Diagnosis not present

## 2019-03-02 DIAGNOSIS — M810 Age-related osteoporosis without current pathological fracture: Secondary | ICD-10-CM | POA: Diagnosis not present

## 2019-03-02 DIAGNOSIS — I10 Essential (primary) hypertension: Secondary | ICD-10-CM | POA: Diagnosis not present

## 2019-03-02 DIAGNOSIS — E119 Type 2 diabetes mellitus without complications: Secondary | ICD-10-CM | POA: Diagnosis not present

## 2019-03-24 DIAGNOSIS — E119 Type 2 diabetes mellitus without complications: Secondary | ICD-10-CM | POA: Diagnosis not present

## 2019-03-24 DIAGNOSIS — I10 Essential (primary) hypertension: Secondary | ICD-10-CM | POA: Diagnosis not present

## 2019-03-24 DIAGNOSIS — M810 Age-related osteoporosis without current pathological fracture: Secondary | ICD-10-CM | POA: Diagnosis not present

## 2019-04-09 ENCOUNTER — Encounter (INDEPENDENT_AMBULATORY_CARE_PROVIDER_SITE_OTHER): Payer: Medicare Other | Admitting: Ophthalmology

## 2019-04-13 DIAGNOSIS — R52 Pain, unspecified: Secondary | ICD-10-CM | POA: Diagnosis not present

## 2019-04-13 DIAGNOSIS — G4489 Other headache syndrome: Secondary | ICD-10-CM | POA: Diagnosis not present

## 2019-04-13 DIAGNOSIS — I10 Essential (primary) hypertension: Secondary | ICD-10-CM | POA: Diagnosis not present

## 2019-04-15 DIAGNOSIS — Z6829 Body mass index (BMI) 29.0-29.9, adult: Secondary | ICD-10-CM | POA: Diagnosis not present

## 2019-04-15 DIAGNOSIS — R519 Headache, unspecified: Secondary | ICD-10-CM | POA: Diagnosis not present

## 2019-04-15 DIAGNOSIS — N183 Chronic kidney disease, stage 3 unspecified: Secondary | ICD-10-CM | POA: Diagnosis not present

## 2019-04-15 DIAGNOSIS — Z299 Encounter for prophylactic measures, unspecified: Secondary | ICD-10-CM | POA: Diagnosis not present

## 2019-04-15 DIAGNOSIS — I10 Essential (primary) hypertension: Secondary | ICD-10-CM | POA: Diagnosis not present

## 2019-04-15 DIAGNOSIS — E119 Type 2 diabetes mellitus without complications: Secondary | ICD-10-CM | POA: Diagnosis not present

## 2019-04-22 DIAGNOSIS — Z299 Encounter for prophylactic measures, unspecified: Secondary | ICD-10-CM | POA: Diagnosis not present

## 2019-04-22 DIAGNOSIS — Z6829 Body mass index (BMI) 29.0-29.9, adult: Secondary | ICD-10-CM | POA: Diagnosis not present

## 2019-04-22 DIAGNOSIS — I10 Essential (primary) hypertension: Secondary | ICD-10-CM | POA: Diagnosis not present

## 2019-04-22 DIAGNOSIS — E1122 Type 2 diabetes mellitus with diabetic chronic kidney disease: Secondary | ICD-10-CM | POA: Diagnosis not present

## 2019-04-22 DIAGNOSIS — R519 Headache, unspecified: Secondary | ICD-10-CM | POA: Diagnosis not present

## 2019-04-22 DIAGNOSIS — E1165 Type 2 diabetes mellitus with hyperglycemia: Secondary | ICD-10-CM | POA: Diagnosis not present

## 2019-04-22 DIAGNOSIS — K219 Gastro-esophageal reflux disease without esophagitis: Secondary | ICD-10-CM | POA: Diagnosis not present

## 2019-04-24 DIAGNOSIS — Z23 Encounter for immunization: Secondary | ICD-10-CM | POA: Diagnosis not present

## 2019-04-24 DIAGNOSIS — I10 Essential (primary) hypertension: Secondary | ICD-10-CM | POA: Diagnosis not present

## 2019-04-24 DIAGNOSIS — E119 Type 2 diabetes mellitus without complications: Secondary | ICD-10-CM | POA: Diagnosis not present

## 2019-04-24 DIAGNOSIS — M810 Age-related osteoporosis without current pathological fracture: Secondary | ICD-10-CM | POA: Diagnosis not present

## 2019-06-12 DIAGNOSIS — E119 Type 2 diabetes mellitus without complications: Secondary | ICD-10-CM | POA: Diagnosis not present

## 2019-06-12 DIAGNOSIS — M810 Age-related osteoporosis without current pathological fracture: Secondary | ICD-10-CM | POA: Diagnosis not present

## 2019-06-12 DIAGNOSIS — I10 Essential (primary) hypertension: Secondary | ICD-10-CM | POA: Diagnosis not present

## 2019-07-10 ENCOUNTER — Encounter (INDEPENDENT_AMBULATORY_CARE_PROVIDER_SITE_OTHER): Payer: Medicare Other | Admitting: Ophthalmology

## 2019-07-16 DIAGNOSIS — Z041 Encounter for examination and observation following transport accident: Secondary | ICD-10-CM | POA: Diagnosis not present

## 2019-07-16 DIAGNOSIS — R0902 Hypoxemia: Secondary | ICD-10-CM | POA: Diagnosis not present

## 2019-07-16 DIAGNOSIS — Z882 Allergy status to sulfonamides status: Secondary | ICD-10-CM | POA: Diagnosis not present

## 2019-07-16 DIAGNOSIS — F0390 Unspecified dementia without behavioral disturbance: Secondary | ICD-10-CM | POA: Diagnosis not present

## 2019-07-16 DIAGNOSIS — Z79899 Other long term (current) drug therapy: Secondary | ICD-10-CM | POA: Diagnosis not present

## 2019-07-16 DIAGNOSIS — I10 Essential (primary) hypertension: Secondary | ICD-10-CM | POA: Diagnosis not present

## 2019-07-21 DIAGNOSIS — I10 Essential (primary) hypertension: Secondary | ICD-10-CM | POA: Diagnosis not present

## 2019-07-21 DIAGNOSIS — M810 Age-related osteoporosis without current pathological fracture: Secondary | ICD-10-CM | POA: Diagnosis not present

## 2019-07-21 DIAGNOSIS — E119 Type 2 diabetes mellitus without complications: Secondary | ICD-10-CM | POA: Diagnosis not present

## 2019-07-27 DIAGNOSIS — Z299 Encounter for prophylactic measures, unspecified: Secondary | ICD-10-CM | POA: Diagnosis not present

## 2019-07-27 DIAGNOSIS — J069 Acute upper respiratory infection, unspecified: Secondary | ICD-10-CM | POA: Diagnosis not present

## 2019-07-27 DIAGNOSIS — Z789 Other specified health status: Secondary | ICD-10-CM | POA: Diagnosis not present

## 2019-07-27 DIAGNOSIS — D692 Other nonthrombocytopenic purpura: Secondary | ICD-10-CM | POA: Diagnosis not present

## 2019-07-27 DIAGNOSIS — N183 Chronic kidney disease, stage 3 unspecified: Secondary | ICD-10-CM | POA: Diagnosis not present

## 2019-07-27 DIAGNOSIS — E119 Type 2 diabetes mellitus without complications: Secondary | ICD-10-CM | POA: Diagnosis not present

## 2019-07-31 DIAGNOSIS — E1165 Type 2 diabetes mellitus with hyperglycemia: Secondary | ICD-10-CM | POA: Diagnosis not present

## 2019-07-31 DIAGNOSIS — J209 Acute bronchitis, unspecified: Secondary | ICD-10-CM | POA: Diagnosis not present

## 2019-07-31 DIAGNOSIS — N183 Chronic kidney disease, stage 3 unspecified: Secondary | ICD-10-CM | POA: Diagnosis not present

## 2019-07-31 DIAGNOSIS — I10 Essential (primary) hypertension: Secondary | ICD-10-CM | POA: Diagnosis not present

## 2019-07-31 DIAGNOSIS — Z299 Encounter for prophylactic measures, unspecified: Secondary | ICD-10-CM | POA: Diagnosis not present

## 2019-08-07 ENCOUNTER — Other Ambulatory Visit: Payer: Self-pay

## 2019-08-07 ENCOUNTER — Encounter (INDEPENDENT_AMBULATORY_CARE_PROVIDER_SITE_OTHER): Payer: Medicare Other | Admitting: Ophthalmology

## 2019-08-07 DIAGNOSIS — H43813 Vitreous degeneration, bilateral: Secondary | ICD-10-CM | POA: Diagnosis not present

## 2019-08-07 DIAGNOSIS — H34832 Tributary (branch) retinal vein occlusion, left eye, with macular edema: Secondary | ICD-10-CM

## 2019-08-07 DIAGNOSIS — D3131 Benign neoplasm of right choroid: Secondary | ICD-10-CM | POA: Diagnosis not present

## 2019-08-07 DIAGNOSIS — H35033 Hypertensive retinopathy, bilateral: Secondary | ICD-10-CM

## 2019-08-07 DIAGNOSIS — I10 Essential (primary) hypertension: Secondary | ICD-10-CM | POA: Diagnosis not present

## 2019-08-19 DIAGNOSIS — I10 Essential (primary) hypertension: Secondary | ICD-10-CM | POA: Diagnosis not present

## 2019-08-19 DIAGNOSIS — M810 Age-related osteoporosis without current pathological fracture: Secondary | ICD-10-CM | POA: Diagnosis not present

## 2019-08-19 DIAGNOSIS — E119 Type 2 diabetes mellitus without complications: Secondary | ICD-10-CM | POA: Diagnosis not present

## 2019-09-02 DIAGNOSIS — Z299 Encounter for prophylactic measures, unspecified: Secondary | ICD-10-CM | POA: Diagnosis not present

## 2019-09-02 DIAGNOSIS — Z6824 Body mass index (BMI) 24.0-24.9, adult: Secondary | ICD-10-CM | POA: Diagnosis not present

## 2019-09-02 DIAGNOSIS — Z713 Dietary counseling and surveillance: Secondary | ICD-10-CM | POA: Diagnosis not present

## 2019-09-02 DIAGNOSIS — F0391 Unspecified dementia with behavioral disturbance: Secondary | ICD-10-CM | POA: Diagnosis not present

## 2019-09-02 DIAGNOSIS — I10 Essential (primary) hypertension: Secondary | ICD-10-CM | POA: Diagnosis not present

## 2019-10-05 DIAGNOSIS — E1165 Type 2 diabetes mellitus with hyperglycemia: Secondary | ICD-10-CM | POA: Diagnosis not present

## 2019-10-05 DIAGNOSIS — F0391 Unspecified dementia with behavioral disturbance: Secondary | ICD-10-CM | POA: Diagnosis not present

## 2019-10-05 DIAGNOSIS — Z299 Encounter for prophylactic measures, unspecified: Secondary | ICD-10-CM | POA: Diagnosis not present

## 2019-10-05 DIAGNOSIS — E1122 Type 2 diabetes mellitus with diabetic chronic kidney disease: Secondary | ICD-10-CM | POA: Diagnosis not present

## 2019-10-05 DIAGNOSIS — K219 Gastro-esophageal reflux disease without esophagitis: Secondary | ICD-10-CM | POA: Diagnosis not present

## 2019-11-13 DIAGNOSIS — G309 Alzheimer's disease, unspecified: Secondary | ICD-10-CM | POA: Diagnosis not present

## 2019-11-13 DIAGNOSIS — F0280 Dementia in other diseases classified elsewhere without behavioral disturbance: Secondary | ICD-10-CM | POA: Diagnosis not present

## 2019-11-13 DIAGNOSIS — F0391 Unspecified dementia with behavioral disturbance: Secondary | ICD-10-CM | POA: Diagnosis not present

## 2020-01-28 DIAGNOSIS — Z1331 Encounter for screening for depression: Secondary | ICD-10-CM | POA: Diagnosis not present

## 2020-01-28 DIAGNOSIS — R5383 Other fatigue: Secondary | ICD-10-CM | POA: Diagnosis not present

## 2020-01-28 DIAGNOSIS — Z6829 Body mass index (BMI) 29.0-29.9, adult: Secondary | ICD-10-CM | POA: Diagnosis not present

## 2020-01-28 DIAGNOSIS — Z299 Encounter for prophylactic measures, unspecified: Secondary | ICD-10-CM | POA: Diagnosis not present

## 2020-01-28 DIAGNOSIS — E1165 Type 2 diabetes mellitus with hyperglycemia: Secondary | ICD-10-CM | POA: Diagnosis not present

## 2020-01-28 DIAGNOSIS — Z1339 Encounter for screening examination for other mental health and behavioral disorders: Secondary | ICD-10-CM | POA: Diagnosis not present

## 2020-01-28 DIAGNOSIS — E7800 Pure hypercholesterolemia, unspecified: Secondary | ICD-10-CM | POA: Diagnosis not present

## 2020-01-28 DIAGNOSIS — Z0000 Encounter for general adult medical examination without abnormal findings: Secondary | ICD-10-CM | POA: Diagnosis not present

## 2020-01-28 DIAGNOSIS — I10 Essential (primary) hypertension: Secondary | ICD-10-CM | POA: Diagnosis not present

## 2020-01-28 DIAGNOSIS — Z7189 Other specified counseling: Secondary | ICD-10-CM | POA: Diagnosis not present

## 2020-01-28 DIAGNOSIS — E559 Vitamin D deficiency, unspecified: Secondary | ICD-10-CM | POA: Diagnosis not present

## 2020-01-29 DIAGNOSIS — E785 Hyperlipidemia, unspecified: Secondary | ICD-10-CM | POA: Diagnosis not present

## 2020-01-29 DIAGNOSIS — I10 Essential (primary) hypertension: Secondary | ICD-10-CM | POA: Diagnosis not present

## 2020-01-29 DIAGNOSIS — M810 Age-related osteoporosis without current pathological fracture: Secondary | ICD-10-CM | POA: Diagnosis not present

## 2020-02-03 DIAGNOSIS — N183 Chronic kidney disease, stage 3 unspecified: Secondary | ICD-10-CM | POA: Diagnosis not present

## 2020-02-03 DIAGNOSIS — E1122 Type 2 diabetes mellitus with diabetic chronic kidney disease: Secondary | ICD-10-CM | POA: Diagnosis not present

## 2020-02-03 DIAGNOSIS — Z299 Encounter for prophylactic measures, unspecified: Secondary | ICD-10-CM | POA: Diagnosis not present

## 2020-02-03 DIAGNOSIS — I10 Essential (primary) hypertension: Secondary | ICD-10-CM | POA: Diagnosis not present

## 2020-02-03 DIAGNOSIS — F0391 Unspecified dementia with behavioral disturbance: Secondary | ICD-10-CM | POA: Diagnosis not present

## 2020-02-03 DIAGNOSIS — E1165 Type 2 diabetes mellitus with hyperglycemia: Secondary | ICD-10-CM | POA: Diagnosis not present

## 2020-03-01 DIAGNOSIS — M810 Age-related osteoporosis without current pathological fracture: Secondary | ICD-10-CM | POA: Diagnosis not present

## 2020-03-01 DIAGNOSIS — E7849 Other hyperlipidemia: Secondary | ICD-10-CM | POA: Diagnosis not present

## 2020-03-01 DIAGNOSIS — I10 Essential (primary) hypertension: Secondary | ICD-10-CM | POA: Diagnosis not present

## 2020-04-06 DIAGNOSIS — Z299 Encounter for prophylactic measures, unspecified: Secondary | ICD-10-CM | POA: Diagnosis not present

## 2020-04-06 DIAGNOSIS — I10 Essential (primary) hypertension: Secondary | ICD-10-CM | POA: Diagnosis not present

## 2020-04-06 DIAGNOSIS — F0391 Unspecified dementia with behavioral disturbance: Secondary | ICD-10-CM | POA: Diagnosis not present

## 2020-04-29 DIAGNOSIS — E7849 Other hyperlipidemia: Secondary | ICD-10-CM | POA: Diagnosis not present

## 2020-04-29 DIAGNOSIS — I10 Essential (primary) hypertension: Secondary | ICD-10-CM | POA: Diagnosis not present

## 2020-04-29 DIAGNOSIS — M810 Age-related osteoporosis without current pathological fracture: Secondary | ICD-10-CM | POA: Diagnosis not present

## 2020-05-07 DIAGNOSIS — Z20822 Contact with and (suspected) exposure to covid-19: Secondary | ICD-10-CM | POA: Diagnosis not present

## 2020-05-09 DIAGNOSIS — Z299 Encounter for prophylactic measures, unspecified: Secondary | ICD-10-CM | POA: Diagnosis not present

## 2020-05-09 DIAGNOSIS — F0391 Unspecified dementia with behavioral disturbance: Secondary | ICD-10-CM | POA: Diagnosis not present

## 2020-05-09 DIAGNOSIS — I10 Essential (primary) hypertension: Secondary | ICD-10-CM | POA: Diagnosis not present

## 2020-05-09 DIAGNOSIS — G309 Alzheimer's disease, unspecified: Secondary | ICD-10-CM | POA: Diagnosis not present

## 2020-05-09 DIAGNOSIS — R059 Cough, unspecified: Secondary | ICD-10-CM | POA: Diagnosis not present

## 2020-05-17 DIAGNOSIS — N183 Chronic kidney disease, stage 3 unspecified: Secondary | ICD-10-CM | POA: Diagnosis not present

## 2020-05-17 DIAGNOSIS — I10 Essential (primary) hypertension: Secondary | ICD-10-CM | POA: Diagnosis not present

## 2020-05-17 DIAGNOSIS — Z23 Encounter for immunization: Secondary | ICD-10-CM | POA: Diagnosis not present

## 2020-05-17 DIAGNOSIS — E1122 Type 2 diabetes mellitus with diabetic chronic kidney disease: Secondary | ICD-10-CM | POA: Diagnosis not present

## 2020-05-17 DIAGNOSIS — Z299 Encounter for prophylactic measures, unspecified: Secondary | ICD-10-CM | POA: Diagnosis not present

## 2020-05-17 DIAGNOSIS — E1165 Type 2 diabetes mellitus with hyperglycemia: Secondary | ICD-10-CM | POA: Diagnosis not present

## 2020-05-17 DIAGNOSIS — F0391 Unspecified dementia with behavioral disturbance: Secondary | ICD-10-CM | POA: Diagnosis not present

## 2020-05-31 DIAGNOSIS — M810 Age-related osteoporosis without current pathological fracture: Secondary | ICD-10-CM | POA: Diagnosis not present

## 2020-05-31 DIAGNOSIS — E7849 Other hyperlipidemia: Secondary | ICD-10-CM | POA: Diagnosis not present

## 2020-05-31 DIAGNOSIS — I10 Essential (primary) hypertension: Secondary | ICD-10-CM | POA: Diagnosis not present

## 2020-06-30 DIAGNOSIS — F0280 Dementia in other diseases classified elsewhere without behavioral disturbance: Secondary | ICD-10-CM | POA: Diagnosis not present

## 2020-06-30 DIAGNOSIS — N183 Chronic kidney disease, stage 3 unspecified: Secondary | ICD-10-CM | POA: Diagnosis not present

## 2020-06-30 DIAGNOSIS — G309 Alzheimer's disease, unspecified: Secondary | ICD-10-CM | POA: Diagnosis not present

## 2020-06-30 DIAGNOSIS — U071 COVID-19: Secondary | ICD-10-CM | POA: Diagnosis not present

## 2020-06-30 DIAGNOSIS — Z299 Encounter for prophylactic measures, unspecified: Secondary | ICD-10-CM | POA: Diagnosis not present

## 2020-07-01 DIAGNOSIS — M810 Age-related osteoporosis without current pathological fracture: Secondary | ICD-10-CM | POA: Diagnosis not present

## 2020-07-01 DIAGNOSIS — E7849 Other hyperlipidemia: Secondary | ICD-10-CM | POA: Diagnosis not present

## 2020-07-01 DIAGNOSIS — I10 Essential (primary) hypertension: Secondary | ICD-10-CM | POA: Diagnosis not present

## 2020-07-11 DIAGNOSIS — F0391 Unspecified dementia with behavioral disturbance: Secondary | ICD-10-CM | POA: Diagnosis not present

## 2020-07-11 DIAGNOSIS — G309 Alzheimer's disease, unspecified: Secondary | ICD-10-CM | POA: Diagnosis not present

## 2020-07-11 DIAGNOSIS — I10 Essential (primary) hypertension: Secondary | ICD-10-CM | POA: Diagnosis not present

## 2020-07-11 DIAGNOSIS — U071 COVID-19: Secondary | ICD-10-CM | POA: Diagnosis not present

## 2020-07-11 DIAGNOSIS — Z299 Encounter for prophylactic measures, unspecified: Secondary | ICD-10-CM | POA: Diagnosis not present

## 2020-07-21 DIAGNOSIS — F0391 Unspecified dementia with behavioral disturbance: Secondary | ICD-10-CM | POA: Diagnosis not present

## 2020-07-21 DIAGNOSIS — Z299 Encounter for prophylactic measures, unspecified: Secondary | ICD-10-CM | POA: Diagnosis not present

## 2020-07-21 DIAGNOSIS — M810 Age-related osteoporosis without current pathological fracture: Secondary | ICD-10-CM | POA: Diagnosis not present

## 2020-07-21 DIAGNOSIS — M19049 Primary osteoarthritis, unspecified hand: Secondary | ICD-10-CM | POA: Diagnosis not present

## 2020-07-21 DIAGNOSIS — I10 Essential (primary) hypertension: Secondary | ICD-10-CM | POA: Diagnosis not present

## 2020-07-24 DIAGNOSIS — R404 Transient alteration of awareness: Secondary | ICD-10-CM | POA: Diagnosis not present

## 2020-07-24 DIAGNOSIS — Z7984 Long term (current) use of oral hypoglycemic drugs: Secondary | ICD-10-CM | POA: Diagnosis not present

## 2020-07-24 DIAGNOSIS — R410 Disorientation, unspecified: Secondary | ICD-10-CM | POA: Diagnosis not present

## 2020-07-24 DIAGNOSIS — I10 Essential (primary) hypertension: Secondary | ICD-10-CM | POA: Diagnosis not present

## 2020-07-24 DIAGNOSIS — E860 Dehydration: Secondary | ICD-10-CM | POA: Diagnosis not present

## 2020-07-24 DIAGNOSIS — R531 Weakness: Secondary | ICD-10-CM | POA: Diagnosis not present

## 2020-07-24 DIAGNOSIS — S0990XA Unspecified injury of head, initial encounter: Secondary | ICD-10-CM | POA: Diagnosis not present

## 2020-07-24 DIAGNOSIS — F0280 Dementia in other diseases classified elsewhere without behavioral disturbance: Secondary | ICD-10-CM | POA: Diagnosis not present

## 2020-07-24 DIAGNOSIS — R0902 Hypoxemia: Secondary | ICD-10-CM | POA: Diagnosis not present

## 2020-07-24 DIAGNOSIS — Z8616 Personal history of COVID-19: Secondary | ICD-10-CM | POA: Diagnosis not present

## 2020-07-24 DIAGNOSIS — R600 Localized edema: Secondary | ICD-10-CM | POA: Diagnosis not present

## 2020-07-24 DIAGNOSIS — N3001 Acute cystitis with hematuria: Secondary | ICD-10-CM | POA: Diagnosis not present

## 2020-07-24 DIAGNOSIS — R296 Repeated falls: Secondary | ICD-10-CM | POA: Diagnosis not present

## 2020-07-24 DIAGNOSIS — K5730 Diverticulosis of large intestine without perforation or abscess without bleeding: Secondary | ICD-10-CM | POA: Diagnosis not present

## 2020-07-24 DIAGNOSIS — G459 Transient cerebral ischemic attack, unspecified: Secondary | ICD-10-CM | POA: Diagnosis not present

## 2020-07-24 DIAGNOSIS — G301 Alzheimer's disease with late onset: Secondary | ICD-10-CM | POA: Diagnosis not present

## 2020-07-24 DIAGNOSIS — N3000 Acute cystitis without hematuria: Secondary | ICD-10-CM | POA: Diagnosis not present

## 2020-07-24 DIAGNOSIS — R4182 Altered mental status, unspecified: Secondary | ICD-10-CM | POA: Diagnosis not present

## 2020-07-24 DIAGNOSIS — E119 Type 2 diabetes mellitus without complications: Secondary | ICD-10-CM | POA: Diagnosis not present

## 2020-07-24 DIAGNOSIS — Z20822 Contact with and (suspected) exposure to covid-19: Secondary | ICD-10-CM | POA: Diagnosis not present

## 2020-07-25 DIAGNOSIS — G459 Transient cerebral ischemic attack, unspecified: Secondary | ICD-10-CM | POA: Diagnosis not present

## 2020-07-25 DIAGNOSIS — R4182 Altered mental status, unspecified: Secondary | ICD-10-CM | POA: Diagnosis not present

## 2020-07-25 DIAGNOSIS — R600 Localized edema: Secondary | ICD-10-CM | POA: Diagnosis not present

## 2020-07-25 DIAGNOSIS — R531 Weakness: Secondary | ICD-10-CM | POA: Diagnosis not present

## 2020-07-25 DIAGNOSIS — N3000 Acute cystitis without hematuria: Secondary | ICD-10-CM | POA: Diagnosis not present

## 2020-07-25 DIAGNOSIS — R410 Disorientation, unspecified: Secondary | ICD-10-CM | POA: Diagnosis not present

## 2020-07-25 DIAGNOSIS — K5730 Diverticulosis of large intestine without perforation or abscess without bleeding: Secondary | ICD-10-CM | POA: Diagnosis not present

## 2020-07-25 DIAGNOSIS — S0990XA Unspecified injury of head, initial encounter: Secondary | ICD-10-CM | POA: Diagnosis not present

## 2020-07-25 DIAGNOSIS — E860 Dehydration: Secondary | ICD-10-CM | POA: Diagnosis not present

## 2020-07-26 DIAGNOSIS — N3000 Acute cystitis without hematuria: Secondary | ICD-10-CM | POA: Diagnosis not present

## 2020-07-26 DIAGNOSIS — G459 Transient cerebral ischemic attack, unspecified: Secondary | ICD-10-CM | POA: Diagnosis not present

## 2020-07-26 DIAGNOSIS — R531 Weakness: Secondary | ICD-10-CM | POA: Diagnosis not present

## 2020-07-26 DIAGNOSIS — E860 Dehydration: Secondary | ICD-10-CM | POA: Diagnosis not present

## 2020-07-27 DIAGNOSIS — N183 Chronic kidney disease, stage 3 unspecified: Secondary | ICD-10-CM | POA: Diagnosis not present

## 2020-07-27 DIAGNOSIS — I10 Essential (primary) hypertension: Secondary | ICD-10-CM | POA: Diagnosis not present

## 2020-07-27 DIAGNOSIS — G301 Alzheimer's disease with late onset: Secondary | ICD-10-CM | POA: Diagnosis present

## 2020-07-27 DIAGNOSIS — L600 Ingrowing nail: Secondary | ICD-10-CM | POA: Diagnosis not present

## 2020-07-27 DIAGNOSIS — R531 Weakness: Secondary | ICD-10-CM | POA: Diagnosis not present

## 2020-07-27 DIAGNOSIS — E860 Dehydration: Secondary | ICD-10-CM | POA: Diagnosis present

## 2020-07-27 DIAGNOSIS — Z7984 Long term (current) use of oral hypoglycemic drugs: Secondary | ICD-10-CM | POA: Diagnosis not present

## 2020-07-27 DIAGNOSIS — N3000 Acute cystitis without hematuria: Secondary | ICD-10-CM | POA: Diagnosis not present

## 2020-07-27 DIAGNOSIS — R059 Cough, unspecified: Secondary | ICD-10-CM | POA: Diagnosis not present

## 2020-07-27 DIAGNOSIS — G309 Alzheimer's disease, unspecified: Secondary | ICD-10-CM | POA: Diagnosis not present

## 2020-07-27 DIAGNOSIS — R41841 Cognitive communication deficit: Secondary | ICD-10-CM | POA: Diagnosis not present

## 2020-07-27 DIAGNOSIS — F0280 Dementia in other diseases classified elsewhere without behavioral disturbance: Secondary | ICD-10-CM | POA: Diagnosis not present

## 2020-07-27 DIAGNOSIS — J069 Acute upper respiratory infection, unspecified: Secondary | ICD-10-CM | POA: Diagnosis not present

## 2020-07-27 DIAGNOSIS — E785 Hyperlipidemia, unspecified: Secondary | ICD-10-CM | POA: Diagnosis present

## 2020-07-27 DIAGNOSIS — E119 Type 2 diabetes mellitus without complications: Secondary | ICD-10-CM | POA: Diagnosis present

## 2020-07-27 DIAGNOSIS — R296 Repeated falls: Secondary | ICD-10-CM | POA: Diagnosis present

## 2020-07-27 DIAGNOSIS — E7800 Pure hypercholesterolemia, unspecified: Secondary | ICD-10-CM | POA: Diagnosis not present

## 2020-07-27 DIAGNOSIS — G459 Transient cerebral ischemic attack, unspecified: Secondary | ICD-10-CM | POA: Diagnosis present

## 2020-07-27 DIAGNOSIS — R457 State of emotional shock and stress, unspecified: Secondary | ICD-10-CM | POA: Diagnosis not present

## 2020-07-27 DIAGNOSIS — R410 Disorientation, unspecified: Secondary | ICD-10-CM | POA: Diagnosis not present

## 2020-07-27 DIAGNOSIS — D696 Thrombocytopenia, unspecified: Secondary | ICD-10-CM | POA: Diagnosis present

## 2020-07-27 DIAGNOSIS — D692 Other nonthrombocytopenic purpura: Secondary | ICD-10-CM | POA: Diagnosis not present

## 2020-07-27 DIAGNOSIS — K219 Gastro-esophageal reflux disease without esophagitis: Secondary | ICD-10-CM | POA: Diagnosis present

## 2020-07-27 DIAGNOSIS — F419 Anxiety disorder, unspecified: Secondary | ICD-10-CM | POA: Diagnosis not present

## 2020-07-27 DIAGNOSIS — Z23 Encounter for immunization: Secondary | ICD-10-CM | POA: Diagnosis not present

## 2020-07-27 DIAGNOSIS — M6281 Muscle weakness (generalized): Secondary | ICD-10-CM | POA: Diagnosis not present

## 2020-07-27 DIAGNOSIS — F0391 Unspecified dementia with behavioral disturbance: Secondary | ICD-10-CM | POA: Diagnosis not present

## 2020-07-27 DIAGNOSIS — E1122 Type 2 diabetes mellitus with diabetic chronic kidney disease: Secondary | ICD-10-CM | POA: Diagnosis not present

## 2020-07-27 DIAGNOSIS — Z20822 Contact with and (suspected) exposure to covid-19: Secondary | ICD-10-CM | POA: Diagnosis present

## 2020-07-27 DIAGNOSIS — Z299 Encounter for prophylactic measures, unspecified: Secondary | ICD-10-CM | POA: Diagnosis not present

## 2020-07-27 DIAGNOSIS — Z0000 Encounter for general adult medical examination without abnormal findings: Secondary | ICD-10-CM | POA: Diagnosis not present

## 2020-07-27 DIAGNOSIS — Z8616 Personal history of COVID-19: Secondary | ICD-10-CM | POA: Diagnosis not present

## 2020-07-27 DIAGNOSIS — R2689 Other abnormalities of gait and mobility: Secondary | ICD-10-CM | POA: Diagnosis not present

## 2020-07-27 DIAGNOSIS — Z7401 Bed confinement status: Secondary | ICD-10-CM | POA: Diagnosis not present

## 2020-08-02 ENCOUNTER — Other Ambulatory Visit: Payer: Self-pay | Admitting: *Deleted

## 2020-08-02 DIAGNOSIS — F0280 Dementia in other diseases classified elsewhere without behavioral disturbance: Secondary | ICD-10-CM | POA: Diagnosis not present

## 2020-08-02 DIAGNOSIS — Z0000 Encounter for general adult medical examination without abnormal findings: Secondary | ICD-10-CM | POA: Diagnosis not present

## 2020-08-02 DIAGNOSIS — G309 Alzheimer's disease, unspecified: Secondary | ICD-10-CM | POA: Diagnosis not present

## 2020-08-02 DIAGNOSIS — Z299 Encounter for prophylactic measures, unspecified: Secondary | ICD-10-CM | POA: Diagnosis not present

## 2020-08-02 DIAGNOSIS — R059 Cough, unspecified: Secondary | ICD-10-CM | POA: Diagnosis not present

## 2020-08-02 DIAGNOSIS — N183 Chronic kidney disease, stage 3 unspecified: Secondary | ICD-10-CM | POA: Diagnosis not present

## 2020-08-02 NOTE — Patient Outreach (Signed)
Member screened for potential [facility] Care Management needs.  Julia [name] is receiving skilled therapy at [facility].   Communication received from [place] indicating transition plan is to return home with family. Lived with son and daughter in law. Member independent prior.   SW indicates there are no identifiable [facility] needs at this time.    Member screened for potential [facility] Care Management needs.   Julia Rolling, MSN, RN,BSN [place] Acute Care Coordinator [phone] [facility]) [phone]  (Toll free office)

## 2020-08-05 ENCOUNTER — Encounter (INDEPENDENT_AMBULATORY_CARE_PROVIDER_SITE_OTHER): Payer: Medicare Other | Admitting: Ophthalmology

## 2020-08-18 DIAGNOSIS — Z299 Encounter for prophylactic measures, unspecified: Secondary | ICD-10-CM | POA: Diagnosis not present

## 2020-08-18 DIAGNOSIS — I10 Essential (primary) hypertension: Secondary | ICD-10-CM | POA: Diagnosis not present

## 2020-08-18 DIAGNOSIS — F0391 Unspecified dementia with behavioral disturbance: Secondary | ICD-10-CM | POA: Diagnosis not present

## 2020-08-18 DIAGNOSIS — L600 Ingrowing nail: Secondary | ICD-10-CM | POA: Diagnosis not present

## 2020-08-18 DIAGNOSIS — E1122 Type 2 diabetes mellitus with diabetic chronic kidney disease: Secondary | ICD-10-CM | POA: Diagnosis not present

## 2020-08-25 DIAGNOSIS — I10 Essential (primary) hypertension: Secondary | ICD-10-CM | POA: Diagnosis not present

## 2020-08-29 DIAGNOSIS — I10 Essential (primary) hypertension: Secondary | ICD-10-CM | POA: Diagnosis not present

## 2020-08-29 DIAGNOSIS — E7800 Pure hypercholesterolemia, unspecified: Secondary | ICD-10-CM | POA: Diagnosis not present

## 2020-09-13 ENCOUNTER — Other Ambulatory Visit: Payer: Self-pay | Admitting: *Deleted

## 2020-09-13 DIAGNOSIS — F0391 Unspecified dementia with behavioral disturbance: Secondary | ICD-10-CM | POA: Diagnosis not present

## 2020-09-13 DIAGNOSIS — E1122 Type 2 diabetes mellitus with diabetic chronic kidney disease: Secondary | ICD-10-CM | POA: Diagnosis not present

## 2020-09-13 DIAGNOSIS — D692 Other nonthrombocytopenic purpura: Secondary | ICD-10-CM | POA: Diagnosis not present

## 2020-09-13 DIAGNOSIS — Z299 Encounter for prophylactic measures, unspecified: Secondary | ICD-10-CM | POA: Diagnosis not present

## 2020-09-13 DIAGNOSIS — N183 Chronic kidney disease, stage 3 unspecified: Secondary | ICD-10-CM | POA: Diagnosis not present

## 2020-09-13 NOTE — Patient Outreach (Signed)
Member screened for potential [facility] Care Management needs.  Mrs. [place] is residing in [facility] for skilled therapy.   Update received from [place] indicating member will transition to [facility] on Thursday, March 17th,   No identifiable [facility] Care Management needs at this time.    Marthenia Rolling, MSN, RN,BSN [place] Acute Care Coordinator [phone] [facility]) [phone]  (Toll free office)

## 2020-09-17 DIAGNOSIS — E1122 Type 2 diabetes mellitus with diabetic chronic kidney disease: Secondary | ICD-10-CM | POA: Diagnosis not present

## 2020-09-17 DIAGNOSIS — I10 Essential (primary) hypertension: Secondary | ICD-10-CM | POA: Diagnosis not present

## 2020-09-17 DIAGNOSIS — F0281 Dementia in other diseases classified elsewhere with behavioral disturbance: Secondary | ICD-10-CM | POA: Diagnosis not present

## 2020-09-17 DIAGNOSIS — G459 Transient cerebral ischemic attack, unspecified: Secondary | ICD-10-CM | POA: Diagnosis not present

## 2020-09-17 DIAGNOSIS — G309 Alzheimer's disease, unspecified: Secondary | ICD-10-CM | POA: Diagnosis not present

## 2020-09-17 DIAGNOSIS — N183 Chronic kidney disease, stage 3 unspecified: Secondary | ICD-10-CM | POA: Diagnosis not present

## 2020-09-17 DIAGNOSIS — Z7984 Long term (current) use of oral hypoglycemic drugs: Secondary | ICD-10-CM | POA: Diagnosis not present

## 2020-09-17 DIAGNOSIS — F419 Anxiety disorder, unspecified: Secondary | ICD-10-CM | POA: Diagnosis not present

## 2020-09-17 DIAGNOSIS — R296 Repeated falls: Secondary | ICD-10-CM | POA: Diagnosis not present

## 2020-09-20 DIAGNOSIS — G459 Transient cerebral ischemic attack, unspecified: Secondary | ICD-10-CM | POA: Diagnosis not present

## 2020-09-20 DIAGNOSIS — F0281 Dementia in other diseases classified elsewhere with behavioral disturbance: Secondary | ICD-10-CM | POA: Diagnosis not present

## 2020-09-20 DIAGNOSIS — E1122 Type 2 diabetes mellitus with diabetic chronic kidney disease: Secondary | ICD-10-CM | POA: Diagnosis not present

## 2020-09-20 DIAGNOSIS — I10 Essential (primary) hypertension: Secondary | ICD-10-CM | POA: Diagnosis not present

## 2020-09-20 DIAGNOSIS — G309 Alzheimer's disease, unspecified: Secondary | ICD-10-CM | POA: Diagnosis not present

## 2020-09-20 DIAGNOSIS — R296 Repeated falls: Secondary | ICD-10-CM | POA: Diagnosis not present

## 2020-09-21 DIAGNOSIS — E1122 Type 2 diabetes mellitus with diabetic chronic kidney disease: Secondary | ICD-10-CM | POA: Diagnosis not present

## 2020-09-21 DIAGNOSIS — F4321 Adjustment disorder with depressed mood: Secondary | ICD-10-CM | POA: Diagnosis not present

## 2020-09-21 DIAGNOSIS — K449 Diaphragmatic hernia without obstruction or gangrene: Secondary | ICD-10-CM | POA: Diagnosis not present

## 2020-09-21 DIAGNOSIS — G301 Alzheimer's disease with late onset: Secondary | ICD-10-CM | POA: Diagnosis not present

## 2020-09-21 DIAGNOSIS — M810 Age-related osteoporosis without current pathological fracture: Secondary | ICD-10-CM | POA: Diagnosis not present

## 2020-09-21 DIAGNOSIS — R296 Repeated falls: Secondary | ICD-10-CM | POA: Diagnosis not present

## 2020-09-21 DIAGNOSIS — I10 Essential (primary) hypertension: Secondary | ICD-10-CM | POA: Diagnosis not present

## 2020-09-21 DIAGNOSIS — Z79899 Other long term (current) drug therapy: Secondary | ICD-10-CM | POA: Diagnosis not present

## 2020-09-21 DIAGNOSIS — N183 Chronic kidney disease, stage 3 unspecified: Secondary | ICD-10-CM | POA: Diagnosis not present

## 2020-09-21 DIAGNOSIS — I69354 Hemiplegia and hemiparesis following cerebral infarction affecting left non-dominant side: Secondary | ICD-10-CM | POA: Diagnosis not present

## 2020-09-21 DIAGNOSIS — E559 Vitamin D deficiency, unspecified: Secondary | ICD-10-CM | POA: Diagnosis not present

## 2020-09-21 DIAGNOSIS — M5137 Other intervertebral disc degeneration, lumbosacral region: Secondary | ICD-10-CM | POA: Diagnosis not present

## 2020-09-22 DIAGNOSIS — G459 Transient cerebral ischemic attack, unspecified: Secondary | ICD-10-CM | POA: Diagnosis not present

## 2020-09-22 DIAGNOSIS — E1122 Type 2 diabetes mellitus with diabetic chronic kidney disease: Secondary | ICD-10-CM | POA: Diagnosis not present

## 2020-09-22 DIAGNOSIS — I10 Essential (primary) hypertension: Secondary | ICD-10-CM | POA: Diagnosis not present

## 2020-09-22 DIAGNOSIS — G309 Alzheimer's disease, unspecified: Secondary | ICD-10-CM | POA: Diagnosis not present

## 2020-09-22 DIAGNOSIS — F0281 Dementia in other diseases classified elsewhere with behavioral disturbance: Secondary | ICD-10-CM | POA: Diagnosis not present

## 2020-09-22 DIAGNOSIS — R296 Repeated falls: Secondary | ICD-10-CM | POA: Diagnosis not present

## 2020-09-26 DIAGNOSIS — F0281 Dementia in other diseases classified elsewhere with behavioral disturbance: Secondary | ICD-10-CM | POA: Diagnosis not present

## 2020-09-26 DIAGNOSIS — G309 Alzheimer's disease, unspecified: Secondary | ICD-10-CM | POA: Diagnosis not present

## 2020-09-26 DIAGNOSIS — I10 Essential (primary) hypertension: Secondary | ICD-10-CM | POA: Diagnosis not present

## 2020-09-26 DIAGNOSIS — G459 Transient cerebral ischemic attack, unspecified: Secondary | ICD-10-CM | POA: Diagnosis not present

## 2020-09-26 DIAGNOSIS — E1122 Type 2 diabetes mellitus with diabetic chronic kidney disease: Secondary | ICD-10-CM | POA: Diagnosis not present

## 2020-09-26 DIAGNOSIS — R296 Repeated falls: Secondary | ICD-10-CM | POA: Diagnosis not present

## 2020-09-27 DIAGNOSIS — F0281 Dementia in other diseases classified elsewhere with behavioral disturbance: Secondary | ICD-10-CM | POA: Diagnosis not present

## 2020-09-27 DIAGNOSIS — I10 Essential (primary) hypertension: Secondary | ICD-10-CM | POA: Diagnosis not present

## 2020-09-27 DIAGNOSIS — G309 Alzheimer's disease, unspecified: Secondary | ICD-10-CM | POA: Diagnosis not present

## 2020-09-27 DIAGNOSIS — F4323 Adjustment disorder with mixed anxiety and depressed mood: Secondary | ICD-10-CM | POA: Diagnosis not present

## 2020-09-27 DIAGNOSIS — G459 Transient cerebral ischemic attack, unspecified: Secondary | ICD-10-CM | POA: Diagnosis not present

## 2020-09-27 DIAGNOSIS — F0391 Unspecified dementia with behavioral disturbance: Secondary | ICD-10-CM | POA: Diagnosis not present

## 2020-09-27 DIAGNOSIS — E1122 Type 2 diabetes mellitus with diabetic chronic kidney disease: Secondary | ICD-10-CM | POA: Diagnosis not present

## 2020-09-27 DIAGNOSIS — R296 Repeated falls: Secondary | ICD-10-CM | POA: Diagnosis not present

## 2020-09-29 DIAGNOSIS — R296 Repeated falls: Secondary | ICD-10-CM | POA: Diagnosis not present

## 2020-09-29 DIAGNOSIS — G459 Transient cerebral ischemic attack, unspecified: Secondary | ICD-10-CM | POA: Diagnosis not present

## 2020-09-29 DIAGNOSIS — I10 Essential (primary) hypertension: Secondary | ICD-10-CM | POA: Diagnosis not present

## 2020-09-29 DIAGNOSIS — G309 Alzheimer's disease, unspecified: Secondary | ICD-10-CM | POA: Diagnosis not present

## 2020-09-29 DIAGNOSIS — I69354 Hemiplegia and hemiparesis following cerebral infarction affecting left non-dominant side: Secondary | ICD-10-CM | POA: Diagnosis not present

## 2020-09-29 DIAGNOSIS — F0281 Dementia in other diseases classified elsewhere with behavioral disturbance: Secondary | ICD-10-CM | POA: Diagnosis not present

## 2020-09-29 DIAGNOSIS — E1122 Type 2 diabetes mellitus with diabetic chronic kidney disease: Secondary | ICD-10-CM | POA: Diagnosis not present

## 2020-09-30 DIAGNOSIS — G301 Alzheimer's disease with late onset: Secondary | ICD-10-CM | POA: Diagnosis not present

## 2020-09-30 DIAGNOSIS — E559 Vitamin D deficiency, unspecified: Secondary | ICD-10-CM | POA: Diagnosis not present

## 2020-09-30 DIAGNOSIS — E119 Type 2 diabetes mellitus without complications: Secondary | ICD-10-CM | POA: Diagnosis not present

## 2020-09-30 DIAGNOSIS — N189 Chronic kidney disease, unspecified: Secondary | ICD-10-CM | POA: Diagnosis not present

## 2020-10-03 DIAGNOSIS — R296 Repeated falls: Secondary | ICD-10-CM | POA: Diagnosis not present

## 2020-10-03 DIAGNOSIS — I10 Essential (primary) hypertension: Secondary | ICD-10-CM | POA: Diagnosis not present

## 2020-10-03 DIAGNOSIS — E1122 Type 2 diabetes mellitus with diabetic chronic kidney disease: Secondary | ICD-10-CM | POA: Diagnosis not present

## 2020-10-03 DIAGNOSIS — G459 Transient cerebral ischemic attack, unspecified: Secondary | ICD-10-CM | POA: Diagnosis not present

## 2020-10-03 DIAGNOSIS — G309 Alzheimer's disease, unspecified: Secondary | ICD-10-CM | POA: Diagnosis not present

## 2020-10-03 DIAGNOSIS — F0281 Dementia in other diseases classified elsewhere with behavioral disturbance: Secondary | ICD-10-CM | POA: Diagnosis not present

## 2020-10-04 DIAGNOSIS — R296 Repeated falls: Secondary | ICD-10-CM | POA: Diagnosis not present

## 2020-10-04 DIAGNOSIS — I10 Essential (primary) hypertension: Secondary | ICD-10-CM | POA: Diagnosis not present

## 2020-10-04 DIAGNOSIS — E1122 Type 2 diabetes mellitus with diabetic chronic kidney disease: Secondary | ICD-10-CM | POA: Diagnosis not present

## 2020-10-04 DIAGNOSIS — F0281 Dementia in other diseases classified elsewhere with behavioral disturbance: Secondary | ICD-10-CM | POA: Diagnosis not present

## 2020-10-04 DIAGNOSIS — G309 Alzheimer's disease, unspecified: Secondary | ICD-10-CM | POA: Diagnosis not present

## 2020-10-04 DIAGNOSIS — G459 Transient cerebral ischemic attack, unspecified: Secondary | ICD-10-CM | POA: Diagnosis not present

## 2020-10-05 DIAGNOSIS — I10 Essential (primary) hypertension: Secondary | ICD-10-CM | POA: Diagnosis not present

## 2020-10-05 DIAGNOSIS — E1122 Type 2 diabetes mellitus with diabetic chronic kidney disease: Secondary | ICD-10-CM | POA: Diagnosis not present

## 2020-10-05 DIAGNOSIS — F0281 Dementia in other diseases classified elsewhere with behavioral disturbance: Secondary | ICD-10-CM | POA: Diagnosis not present

## 2020-10-05 DIAGNOSIS — R296 Repeated falls: Secondary | ICD-10-CM | POA: Diagnosis not present

## 2020-10-05 DIAGNOSIS — G459 Transient cerebral ischemic attack, unspecified: Secondary | ICD-10-CM | POA: Diagnosis not present

## 2020-10-05 DIAGNOSIS — G309 Alzheimer's disease, unspecified: Secondary | ICD-10-CM | POA: Diagnosis not present

## 2020-10-06 DIAGNOSIS — G309 Alzheimer's disease, unspecified: Secondary | ICD-10-CM | POA: Diagnosis not present

## 2020-10-06 DIAGNOSIS — F0281 Dementia in other diseases classified elsewhere with behavioral disturbance: Secondary | ICD-10-CM | POA: Diagnosis not present

## 2020-10-06 DIAGNOSIS — E1122 Type 2 diabetes mellitus with diabetic chronic kidney disease: Secondary | ICD-10-CM | POA: Diagnosis not present

## 2020-10-06 DIAGNOSIS — R296 Repeated falls: Secondary | ICD-10-CM | POA: Diagnosis not present

## 2020-10-06 DIAGNOSIS — I10 Essential (primary) hypertension: Secondary | ICD-10-CM | POA: Diagnosis not present

## 2020-10-06 DIAGNOSIS — G459 Transient cerebral ischemic attack, unspecified: Secondary | ICD-10-CM | POA: Diagnosis not present

## 2020-10-10 DIAGNOSIS — E1122 Type 2 diabetes mellitus with diabetic chronic kidney disease: Secondary | ICD-10-CM | POA: Diagnosis not present

## 2020-10-10 DIAGNOSIS — F0281 Dementia in other diseases classified elsewhere with behavioral disturbance: Secondary | ICD-10-CM | POA: Diagnosis not present

## 2020-10-10 DIAGNOSIS — I10 Essential (primary) hypertension: Secondary | ICD-10-CM | POA: Diagnosis not present

## 2020-10-10 DIAGNOSIS — G309 Alzheimer's disease, unspecified: Secondary | ICD-10-CM | POA: Diagnosis not present

## 2020-10-10 DIAGNOSIS — R296 Repeated falls: Secondary | ICD-10-CM | POA: Diagnosis not present

## 2020-10-10 DIAGNOSIS — G459 Transient cerebral ischemic attack, unspecified: Secondary | ICD-10-CM | POA: Diagnosis not present

## 2020-10-11 DIAGNOSIS — R296 Repeated falls: Secondary | ICD-10-CM | POA: Diagnosis not present

## 2020-10-11 DIAGNOSIS — I10 Essential (primary) hypertension: Secondary | ICD-10-CM | POA: Diagnosis not present

## 2020-10-11 DIAGNOSIS — F0281 Dementia in other diseases classified elsewhere with behavioral disturbance: Secondary | ICD-10-CM | POA: Diagnosis not present

## 2020-10-11 DIAGNOSIS — G309 Alzheimer's disease, unspecified: Secondary | ICD-10-CM | POA: Diagnosis not present

## 2020-10-11 DIAGNOSIS — G459 Transient cerebral ischemic attack, unspecified: Secondary | ICD-10-CM | POA: Diagnosis not present

## 2020-10-11 DIAGNOSIS — E1122 Type 2 diabetes mellitus with diabetic chronic kidney disease: Secondary | ICD-10-CM | POA: Diagnosis not present

## 2020-10-13 DIAGNOSIS — E1122 Type 2 diabetes mellitus with diabetic chronic kidney disease: Secondary | ICD-10-CM | POA: Diagnosis not present

## 2020-10-13 DIAGNOSIS — R296 Repeated falls: Secondary | ICD-10-CM | POA: Diagnosis not present

## 2020-10-13 DIAGNOSIS — F0281 Dementia in other diseases classified elsewhere with behavioral disturbance: Secondary | ICD-10-CM | POA: Diagnosis not present

## 2020-10-13 DIAGNOSIS — G309 Alzheimer's disease, unspecified: Secondary | ICD-10-CM | POA: Diagnosis not present

## 2020-10-13 DIAGNOSIS — G459 Transient cerebral ischemic attack, unspecified: Secondary | ICD-10-CM | POA: Diagnosis not present

## 2020-10-13 DIAGNOSIS — I10 Essential (primary) hypertension: Secondary | ICD-10-CM | POA: Diagnosis not present

## 2020-10-17 DIAGNOSIS — I10 Essential (primary) hypertension: Secondary | ICD-10-CM | POA: Diagnosis not present

## 2020-10-17 DIAGNOSIS — E1122 Type 2 diabetes mellitus with diabetic chronic kidney disease: Secondary | ICD-10-CM | POA: Diagnosis not present

## 2020-10-17 DIAGNOSIS — F419 Anxiety disorder, unspecified: Secondary | ICD-10-CM | POA: Diagnosis not present

## 2020-10-17 DIAGNOSIS — Z23 Encounter for immunization: Secondary | ICD-10-CM | POA: Diagnosis not present

## 2020-10-17 DIAGNOSIS — Z7984 Long term (current) use of oral hypoglycemic drugs: Secondary | ICD-10-CM | POA: Diagnosis not present

## 2020-10-17 DIAGNOSIS — G459 Transient cerebral ischemic attack, unspecified: Secondary | ICD-10-CM | POA: Diagnosis not present

## 2020-10-17 DIAGNOSIS — N183 Chronic kidney disease, stage 3 unspecified: Secondary | ICD-10-CM | POA: Diagnosis not present

## 2020-10-17 DIAGNOSIS — G309 Alzheimer's disease, unspecified: Secondary | ICD-10-CM | POA: Diagnosis not present

## 2020-10-17 DIAGNOSIS — F0281 Dementia in other diseases classified elsewhere with behavioral disturbance: Secondary | ICD-10-CM | POA: Diagnosis not present

## 2020-10-17 DIAGNOSIS — R296 Repeated falls: Secondary | ICD-10-CM | POA: Diagnosis not present

## 2020-10-18 DIAGNOSIS — R296 Repeated falls: Secondary | ICD-10-CM | POA: Diagnosis not present

## 2020-10-18 DIAGNOSIS — G309 Alzheimer's disease, unspecified: Secondary | ICD-10-CM | POA: Diagnosis not present

## 2020-10-18 DIAGNOSIS — E1122 Type 2 diabetes mellitus with diabetic chronic kidney disease: Secondary | ICD-10-CM | POA: Diagnosis not present

## 2020-10-18 DIAGNOSIS — I10 Essential (primary) hypertension: Secondary | ICD-10-CM | POA: Diagnosis not present

## 2020-10-18 DIAGNOSIS — F0281 Dementia in other diseases classified elsewhere with behavioral disturbance: Secondary | ICD-10-CM | POA: Diagnosis not present

## 2020-10-18 DIAGNOSIS — G459 Transient cerebral ischemic attack, unspecified: Secondary | ICD-10-CM | POA: Diagnosis not present

## 2020-10-19 DIAGNOSIS — E1122 Type 2 diabetes mellitus with diabetic chronic kidney disease: Secondary | ICD-10-CM | POA: Diagnosis not present

## 2020-10-19 DIAGNOSIS — F4321 Adjustment disorder with depressed mood: Secondary | ICD-10-CM | POA: Diagnosis not present

## 2020-10-19 DIAGNOSIS — G301 Alzheimer's disease with late onset: Secondary | ICD-10-CM | POA: Diagnosis not present

## 2020-10-19 DIAGNOSIS — N1831 Chronic kidney disease, stage 3a: Secondary | ICD-10-CM | POA: Diagnosis not present

## 2020-10-19 DIAGNOSIS — M810 Age-related osteoporosis without current pathological fracture: Secondary | ICD-10-CM | POA: Diagnosis not present

## 2020-10-19 DIAGNOSIS — Z79899 Other long term (current) drug therapy: Secondary | ICD-10-CM | POA: Diagnosis not present

## 2020-10-19 DIAGNOSIS — I69354 Hemiplegia and hemiparesis following cerebral infarction affecting left non-dominant side: Secondary | ICD-10-CM | POA: Diagnosis not present

## 2020-10-19 DIAGNOSIS — I10 Essential (primary) hypertension: Secondary | ICD-10-CM | POA: Diagnosis not present

## 2020-10-19 DIAGNOSIS — E559 Vitamin D deficiency, unspecified: Secondary | ICD-10-CM | POA: Diagnosis not present

## 2020-10-19 DIAGNOSIS — K449 Diaphragmatic hernia without obstruction or gangrene: Secondary | ICD-10-CM | POA: Diagnosis not present

## 2020-10-20 DIAGNOSIS — G309 Alzheimer's disease, unspecified: Secondary | ICD-10-CM | POA: Diagnosis not present

## 2020-10-20 DIAGNOSIS — I10 Essential (primary) hypertension: Secondary | ICD-10-CM | POA: Diagnosis not present

## 2020-10-20 DIAGNOSIS — F0281 Dementia in other diseases classified elsewhere with behavioral disturbance: Secondary | ICD-10-CM | POA: Diagnosis not present

## 2020-10-20 DIAGNOSIS — E1122 Type 2 diabetes mellitus with diabetic chronic kidney disease: Secondary | ICD-10-CM | POA: Diagnosis not present

## 2020-10-20 DIAGNOSIS — R296 Repeated falls: Secondary | ICD-10-CM | POA: Diagnosis not present

## 2020-10-20 DIAGNOSIS — G459 Transient cerebral ischemic attack, unspecified: Secondary | ICD-10-CM | POA: Diagnosis not present

## 2020-10-24 DIAGNOSIS — R296 Repeated falls: Secondary | ICD-10-CM | POA: Diagnosis not present

## 2020-10-24 DIAGNOSIS — G459 Transient cerebral ischemic attack, unspecified: Secondary | ICD-10-CM | POA: Diagnosis not present

## 2020-10-24 DIAGNOSIS — I10 Essential (primary) hypertension: Secondary | ICD-10-CM | POA: Diagnosis not present

## 2020-10-24 DIAGNOSIS — G309 Alzheimer's disease, unspecified: Secondary | ICD-10-CM | POA: Diagnosis not present

## 2020-10-24 DIAGNOSIS — F0281 Dementia in other diseases classified elsewhere with behavioral disturbance: Secondary | ICD-10-CM | POA: Diagnosis not present

## 2020-10-24 DIAGNOSIS — E1122 Type 2 diabetes mellitus with diabetic chronic kidney disease: Secondary | ICD-10-CM | POA: Diagnosis not present

## 2020-10-25 DIAGNOSIS — F0281 Dementia in other diseases classified elsewhere with behavioral disturbance: Secondary | ICD-10-CM | POA: Diagnosis not present

## 2020-10-25 DIAGNOSIS — E1122 Type 2 diabetes mellitus with diabetic chronic kidney disease: Secondary | ICD-10-CM | POA: Diagnosis not present

## 2020-10-25 DIAGNOSIS — G459 Transient cerebral ischemic attack, unspecified: Secondary | ICD-10-CM | POA: Diagnosis not present

## 2020-10-25 DIAGNOSIS — R296 Repeated falls: Secondary | ICD-10-CM | POA: Diagnosis not present

## 2020-10-25 DIAGNOSIS — I10 Essential (primary) hypertension: Secondary | ICD-10-CM | POA: Diagnosis not present

## 2020-10-25 DIAGNOSIS — G309 Alzheimer's disease, unspecified: Secondary | ICD-10-CM | POA: Diagnosis not present

## 2020-10-25 DIAGNOSIS — G301 Alzheimer's disease with late onset: Secondary | ICD-10-CM | POA: Diagnosis not present

## 2020-10-25 DIAGNOSIS — F4321 Adjustment disorder with depressed mood: Secondary | ICD-10-CM | POA: Diagnosis not present

## 2020-10-26 DIAGNOSIS — G459 Transient cerebral ischemic attack, unspecified: Secondary | ICD-10-CM | POA: Diagnosis not present

## 2020-10-26 DIAGNOSIS — E1122 Type 2 diabetes mellitus with diabetic chronic kidney disease: Secondary | ICD-10-CM | POA: Diagnosis not present

## 2020-10-26 DIAGNOSIS — R296 Repeated falls: Secondary | ICD-10-CM | POA: Diagnosis not present

## 2020-10-26 DIAGNOSIS — G309 Alzheimer's disease, unspecified: Secondary | ICD-10-CM | POA: Diagnosis not present

## 2020-10-26 DIAGNOSIS — I10 Essential (primary) hypertension: Secondary | ICD-10-CM | POA: Diagnosis not present

## 2020-10-26 DIAGNOSIS — F0281 Dementia in other diseases classified elsewhere with behavioral disturbance: Secondary | ICD-10-CM | POA: Diagnosis not present

## 2020-10-27 ENCOUNTER — Other Ambulatory Visit (HOSPITAL_COMMUNITY)
Admission: RE | Admit: 2020-10-27 | Discharge: 2020-10-27 | Disposition: A | Discharge status: Home / Self Care | Payer: Medicare Other | Source: Other Acute Inpatient Hospital | Attending: *Deleted | Admitting: *Deleted

## 2020-10-27 DIAGNOSIS — E1122 Type 2 diabetes mellitus with diabetic chronic kidney disease: Secondary | ICD-10-CM | POA: Diagnosis not present

## 2020-10-27 DIAGNOSIS — G459 Transient cerebral ischemic attack, unspecified: Secondary | ICD-10-CM | POA: Diagnosis not present

## 2020-10-27 DIAGNOSIS — F0281 Dementia in other diseases classified elsewhere with behavioral disturbance: Secondary | ICD-10-CM | POA: Diagnosis not present

## 2020-10-27 DIAGNOSIS — I10 Essential (primary) hypertension: Secondary | ICD-10-CM | POA: Diagnosis not present

## 2020-10-27 DIAGNOSIS — R296 Repeated falls: Secondary | ICD-10-CM | POA: Diagnosis not present

## 2020-10-27 DIAGNOSIS — G309 Alzheimer's disease, unspecified: Secondary | ICD-10-CM | POA: Diagnosis not present

## 2020-10-31 ENCOUNTER — Other Ambulatory Visit (HOSPITAL_COMMUNITY)
Admission: RE | Admit: 2020-10-31 | Discharge: 2020-10-31 | Disposition: A | Discharge status: Home / Self Care | Payer: Medicare Other | Source: Skilled Nursing Facility | Attending: Family Medicine | Admitting: Family Medicine

## 2020-10-31 DIAGNOSIS — N390 Urinary tract infection, site not specified: Secondary | ICD-10-CM | POA: Insufficient documentation

## 2020-10-31 DIAGNOSIS — R296 Repeated falls: Secondary | ICD-10-CM | POA: Diagnosis not present

## 2020-10-31 DIAGNOSIS — F0281 Dementia in other diseases classified elsewhere with behavioral disturbance: Secondary | ICD-10-CM | POA: Diagnosis not present

## 2020-10-31 DIAGNOSIS — G459 Transient cerebral ischemic attack, unspecified: Secondary | ICD-10-CM | POA: Diagnosis not present

## 2020-10-31 DIAGNOSIS — G309 Alzheimer's disease, unspecified: Secondary | ICD-10-CM | POA: Diagnosis not present

## 2020-10-31 DIAGNOSIS — E1122 Type 2 diabetes mellitus with diabetic chronic kidney disease: Secondary | ICD-10-CM | POA: Diagnosis not present

## 2020-10-31 DIAGNOSIS — I10 Essential (primary) hypertension: Secondary | ICD-10-CM | POA: Diagnosis not present

## 2020-10-31 LAB — URINALYSIS, ROUTINE W REFLEX MICROSCOPIC
Bilirubin Urine: NEGATIVE
Glucose, UA: NEGATIVE mg/dL
Hgb urine dipstick: NEGATIVE
Ketones, ur: NEGATIVE mg/dL
Leukocytes,Ua: NEGATIVE
Nitrite: NEGATIVE
Protein, ur: NEGATIVE mg/dL
Specific Gravity, Urine: 1.023 (ref 1.005–1.030)
pH: 6 (ref 5.0–8.0)

## 2020-11-01 LAB — URINE CULTURE

## 2020-11-02 DIAGNOSIS — F0281 Dementia in other diseases classified elsewhere with behavioral disturbance: Secondary | ICD-10-CM | POA: Diagnosis not present

## 2020-11-02 DIAGNOSIS — G309 Alzheimer's disease, unspecified: Secondary | ICD-10-CM | POA: Diagnosis not present

## 2020-11-02 DIAGNOSIS — G459 Transient cerebral ischemic attack, unspecified: Secondary | ICD-10-CM | POA: Diagnosis not present

## 2020-11-02 DIAGNOSIS — E1122 Type 2 diabetes mellitus with diabetic chronic kidney disease: Secondary | ICD-10-CM | POA: Diagnosis not present

## 2020-11-02 DIAGNOSIS — I10 Essential (primary) hypertension: Secondary | ICD-10-CM | POA: Diagnosis not present

## 2020-11-02 DIAGNOSIS — R296 Repeated falls: Secondary | ICD-10-CM | POA: Diagnosis not present

## 2020-11-08 DIAGNOSIS — G459 Transient cerebral ischemic attack, unspecified: Secondary | ICD-10-CM | POA: Diagnosis not present

## 2020-11-08 DIAGNOSIS — E1122 Type 2 diabetes mellitus with diabetic chronic kidney disease: Secondary | ICD-10-CM | POA: Diagnosis not present

## 2020-11-08 DIAGNOSIS — I10 Essential (primary) hypertension: Secondary | ICD-10-CM | POA: Diagnosis not present

## 2020-11-08 DIAGNOSIS — R296 Repeated falls: Secondary | ICD-10-CM | POA: Diagnosis not present

## 2020-11-08 DIAGNOSIS — F0281 Dementia in other diseases classified elsewhere with behavioral disturbance: Secondary | ICD-10-CM | POA: Diagnosis not present

## 2020-11-08 DIAGNOSIS — G309 Alzheimer's disease, unspecified: Secondary | ICD-10-CM | POA: Diagnosis not present

## 2020-11-11 DIAGNOSIS — G459 Transient cerebral ischemic attack, unspecified: Secondary | ICD-10-CM | POA: Diagnosis not present

## 2020-11-11 DIAGNOSIS — E1122 Type 2 diabetes mellitus with diabetic chronic kidney disease: Secondary | ICD-10-CM | POA: Diagnosis not present

## 2020-11-11 DIAGNOSIS — R296 Repeated falls: Secondary | ICD-10-CM | POA: Diagnosis not present

## 2020-11-11 DIAGNOSIS — F0281 Dementia in other diseases classified elsewhere with behavioral disturbance: Secondary | ICD-10-CM | POA: Diagnosis not present

## 2020-11-11 DIAGNOSIS — I10 Essential (primary) hypertension: Secondary | ICD-10-CM | POA: Diagnosis not present

## 2020-11-11 DIAGNOSIS — G309 Alzheimer's disease, unspecified: Secondary | ICD-10-CM | POA: Diagnosis not present

## 2020-11-13 ENCOUNTER — Other Ambulatory Visit: Payer: Self-pay

## 2020-11-13 ENCOUNTER — Emergency Department (HOSPITAL_COMMUNITY)
Admission: EM | Admit: 2020-11-13 | Discharge: 2020-11-14 | Disposition: A | Discharge status: Home / Self Care | Payer: Medicare Other | Attending: Emergency Medicine | Admitting: Emergency Medicine

## 2020-11-13 ENCOUNTER — Emergency Department (HOSPITAL_COMMUNITY): Payer: Medicare Other

## 2020-11-13 ENCOUNTER — Encounter (HOSPITAL_COMMUNITY): Payer: Self-pay

## 2020-11-13 DIAGNOSIS — S199XXA Unspecified injury of neck, initial encounter: Secondary | ICD-10-CM | POA: Diagnosis not present

## 2020-11-13 DIAGNOSIS — M79632 Pain in left forearm: Secondary | ICD-10-CM | POA: Insufficient documentation

## 2020-11-13 DIAGNOSIS — E041 Nontoxic single thyroid nodule: Secondary | ICD-10-CM

## 2020-11-13 DIAGNOSIS — S79911A Unspecified injury of right hip, initial encounter: Secondary | ICD-10-CM | POA: Diagnosis not present

## 2020-11-13 DIAGNOSIS — S42032A Displaced fracture of lateral end of left clavicle, initial encounter for closed fracture: Secondary | ICD-10-CM | POA: Diagnosis not present

## 2020-11-13 DIAGNOSIS — M542 Cervicalgia: Secondary | ICD-10-CM | POA: Insufficient documentation

## 2020-11-13 DIAGNOSIS — F0280 Dementia in other diseases classified elsewhere without behavioral disturbance: Secondary | ICD-10-CM | POA: Diagnosis not present

## 2020-11-13 DIAGNOSIS — I10 Essential (primary) hypertension: Secondary | ICD-10-CM | POA: Diagnosis not present

## 2020-11-13 DIAGNOSIS — R519 Headache, unspecified: Secondary | ICD-10-CM | POA: Diagnosis not present

## 2020-11-13 DIAGNOSIS — R404 Transient alteration of awareness: Secondary | ICD-10-CM | POA: Diagnosis not present

## 2020-11-13 DIAGNOSIS — I6529 Occlusion and stenosis of unspecified carotid artery: Secondary | ICD-10-CM

## 2020-11-13 DIAGNOSIS — S4992XA Unspecified injury of left shoulder and upper arm, initial encounter: Secondary | ICD-10-CM | POA: Diagnosis not present

## 2020-11-13 DIAGNOSIS — G309 Alzheimer's disease, unspecified: Secondary | ICD-10-CM | POA: Diagnosis not present

## 2020-11-13 DIAGNOSIS — F0390 Unspecified dementia without behavioral disturbance: Secondary | ICD-10-CM | POA: Diagnosis not present

## 2020-11-13 DIAGNOSIS — R000 Tachycardia, unspecified: Secondary | ICD-10-CM | POA: Diagnosis not present

## 2020-11-13 DIAGNOSIS — M7989 Other specified soft tissue disorders: Secondary | ICD-10-CM | POA: Diagnosis not present

## 2020-11-13 DIAGNOSIS — Y92129 Unspecified place in nursing home as the place of occurrence of the external cause: Secondary | ICD-10-CM | POA: Insufficient documentation

## 2020-11-13 DIAGNOSIS — S4991XA Unspecified injury of right shoulder and upper arm, initial encounter: Secondary | ICD-10-CM | POA: Diagnosis not present

## 2020-11-13 DIAGNOSIS — S59912A Unspecified injury of left forearm, initial encounter: Secondary | ICD-10-CM | POA: Diagnosis not present

## 2020-11-13 DIAGNOSIS — W19XXXA Unspecified fall, initial encounter: Secondary | ICD-10-CM | POA: Diagnosis not present

## 2020-11-13 DIAGNOSIS — S42002A Fracture of unspecified part of left clavicle, initial encounter for closed fracture: Secondary | ICD-10-CM

## 2020-11-13 DIAGNOSIS — M25512 Pain in left shoulder: Secondary | ICD-10-CM | POA: Diagnosis not present

## 2020-11-13 DIAGNOSIS — S79912A Unspecified injury of left hip, initial encounter: Secondary | ICD-10-CM | POA: Diagnosis not present

## 2020-11-13 DIAGNOSIS — M47816 Spondylosis without myelopathy or radiculopathy, lumbar region: Secondary | ICD-10-CM | POA: Diagnosis not present

## 2020-11-13 HISTORY — DX: Cerebral infarction, unspecified: I63.9

## 2020-11-13 HISTORY — DX: Essential (primary) hypertension: I10

## 2020-11-13 HISTORY — DX: Dementia in other diseases classified elsewhere, unspecified severity, without behavioral disturbance, psychotic disturbance, mood disturbance, and anxiety: F02.80

## 2020-11-13 HISTORY — DX: Anxiety disorder, unspecified: F41.9

## 2020-11-13 IMAGING — DX DG FOREARM 2V*L*
2 series · 2 of 2 positions shown · non-contrast
Comparison: None.

CLINICAL DATA: Initial evaluation for acute trauma, fall.

EXAM:
LEFT FOREARM - 2 VIEW

[forearm ap]
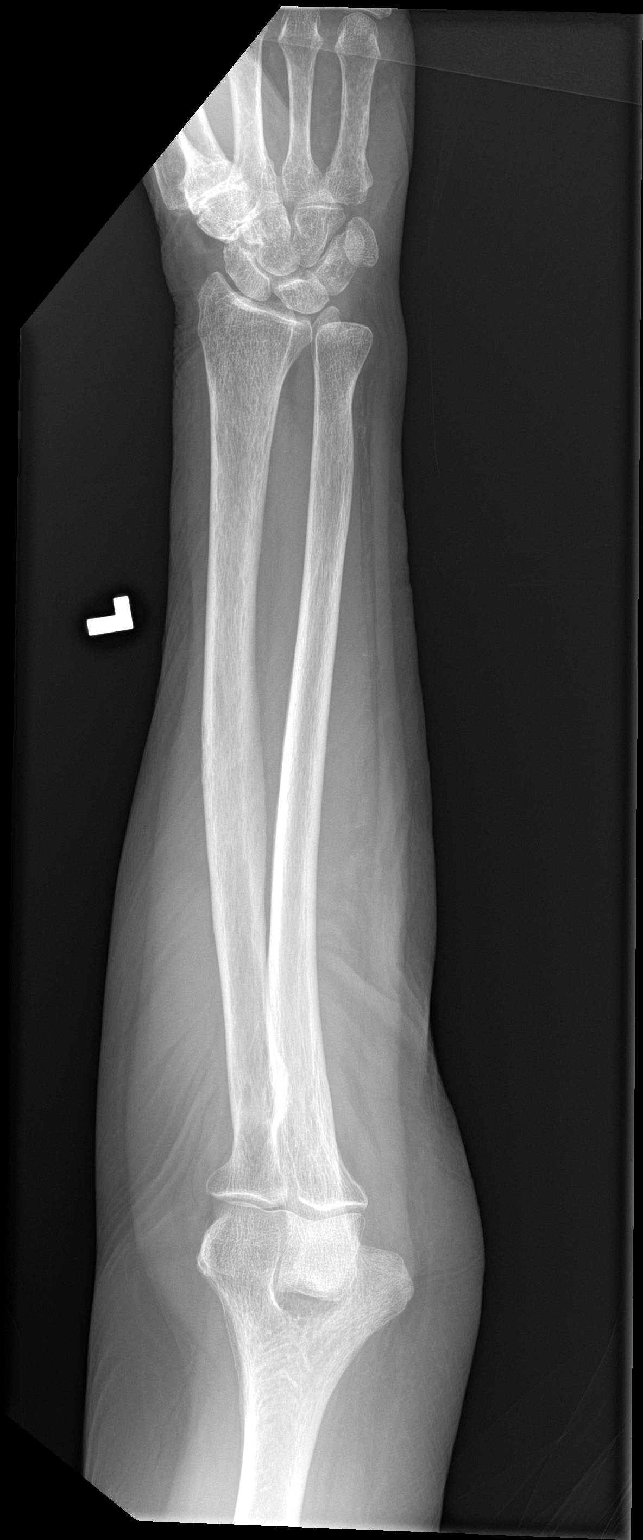

[forearm lat]
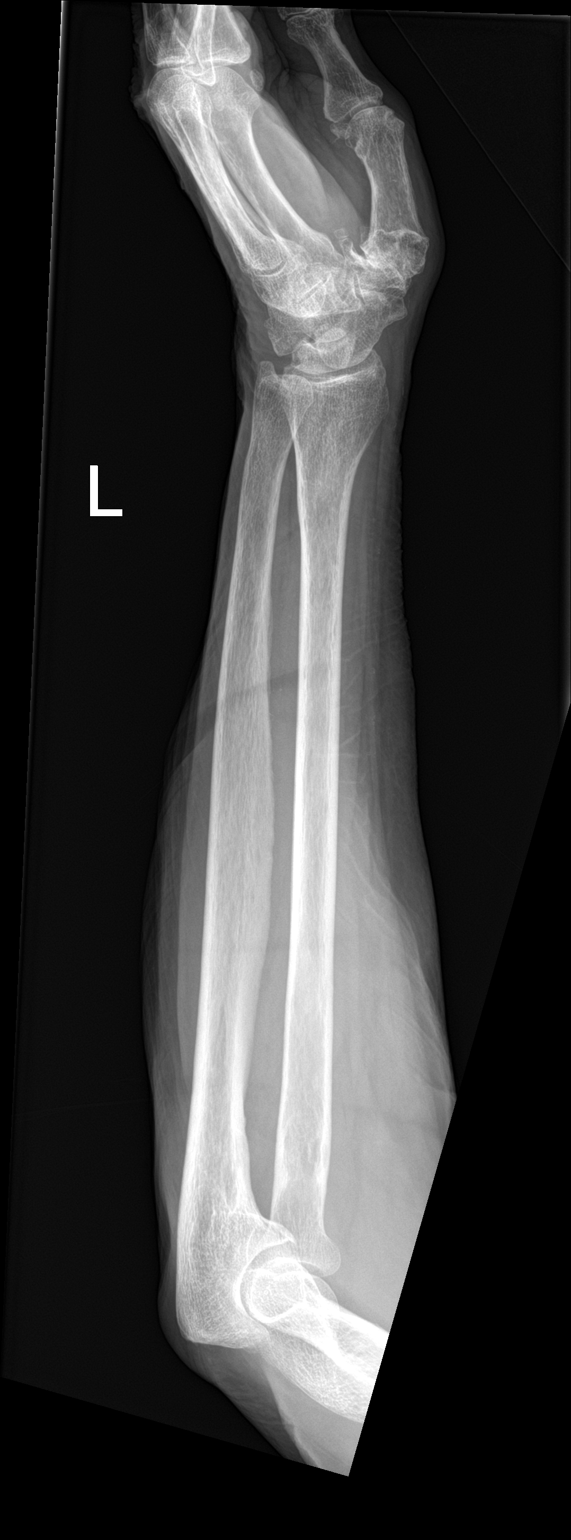

[2 of 2 positions shown; findings below may reference images not displayed]

FINDINGS: No acute fracture dislocation. Limited views of the wrist and elbow
demonstrate no acute finding. Osteoarthritic changes noted about the
visualized hand. Osteopenia. No visualized soft tissue injury.
IMPRESSION: No acute osseous abnormality about the left forearm.

## 2020-11-13 IMAGING — CT CT HEAD W/O CM
3 series · 16 of 47 positions shown, 19 images · non-contrast
Comparison: [DATE]

CLINICAL DATA: Un witnessed mechanical fall

EXAM:
CT HEAD WITHOUT CONTRAST
TECHNIQUE: Contiguous axial images were obtained from the base of the skull
through the vertex without intravenous contrast.

[Series 2: head w o · axial · 0.46mm/px · z∈[+21,+146]mm · 10 of 31 slices shown, 13 images]
[im 3/31  brain]
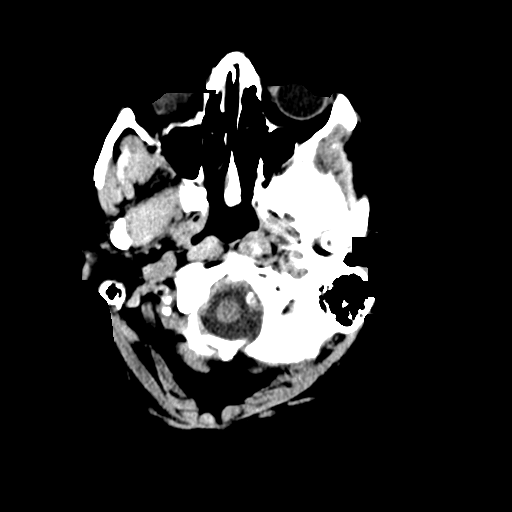
[im 3/31  bone]
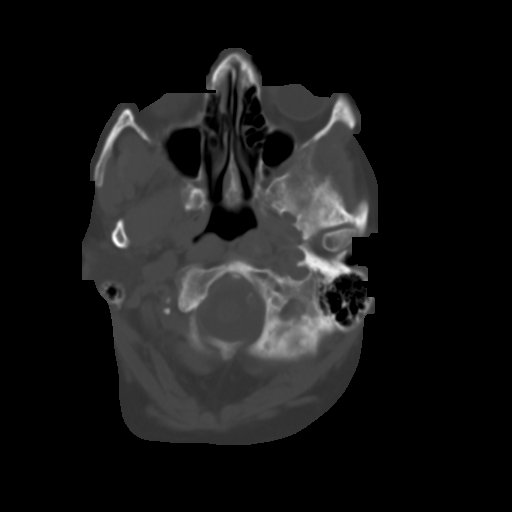
[im 6/31  brain]
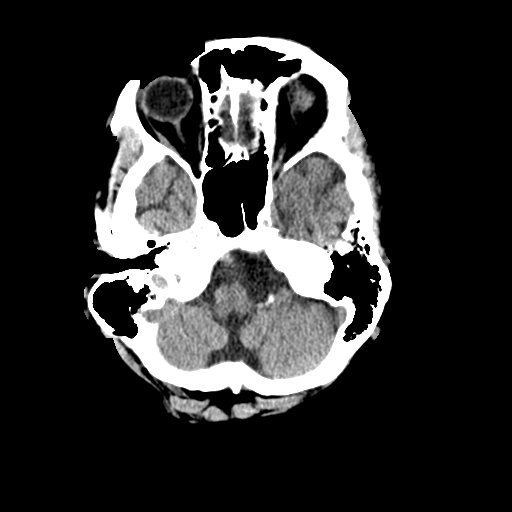
[im 9/31  brain]
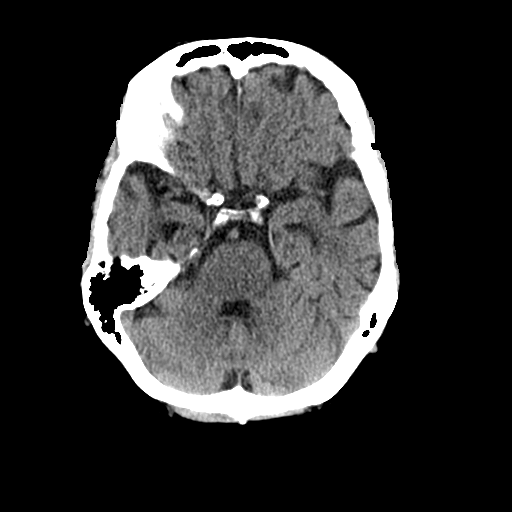
[im 11/31  brain]
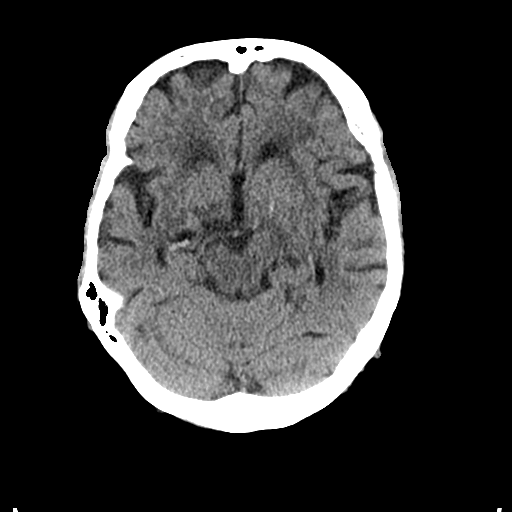
[im 14/31  brain]
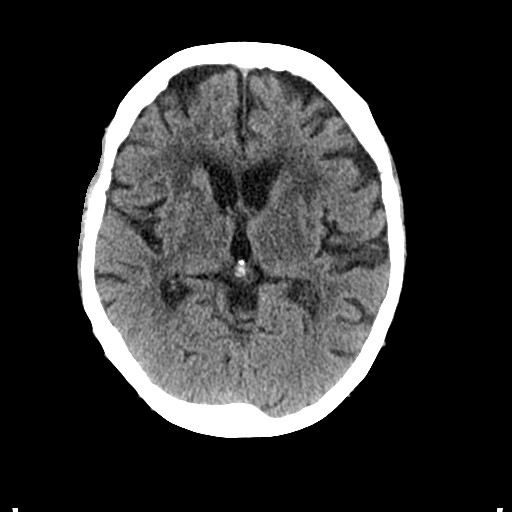
[im 14/31  bone]
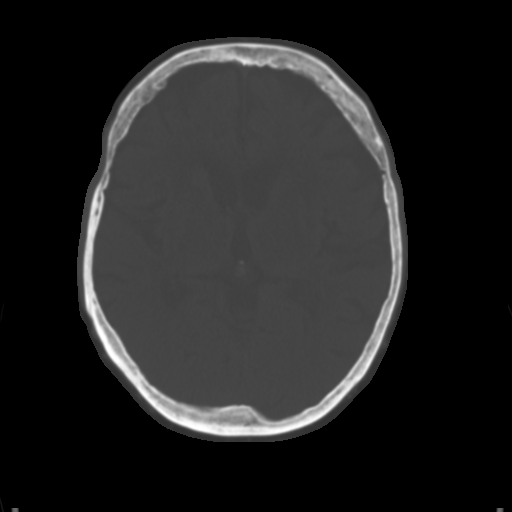
[im 17/31  brain]
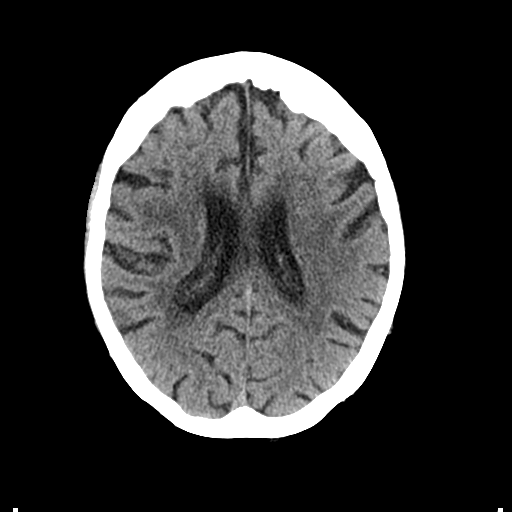
[im 20/31  brain]
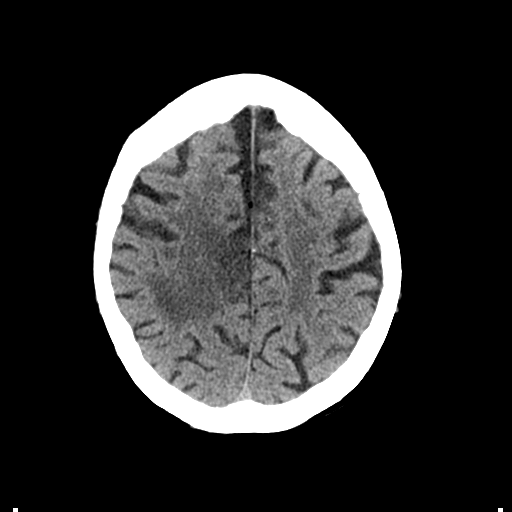
[im 23/31  brain]
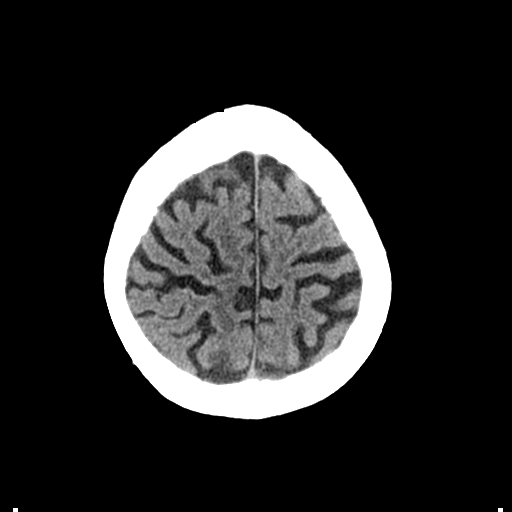
[im 25/31  brain]
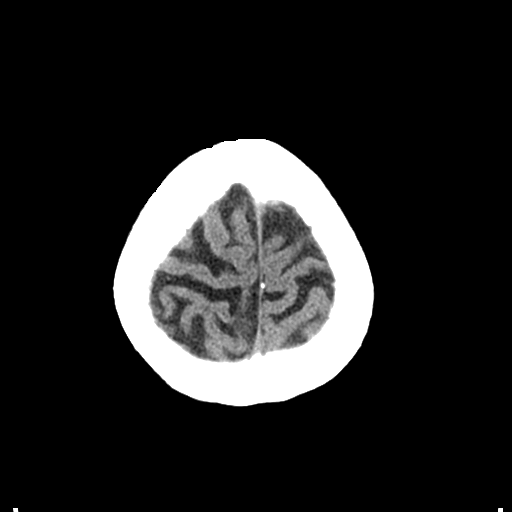
[im 25/31  bone]
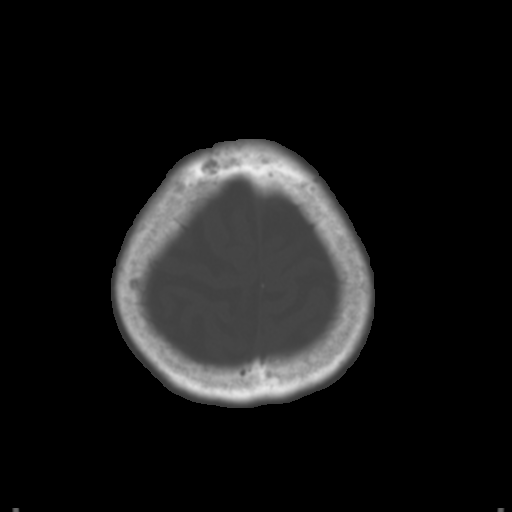
[im 28/31  brain]
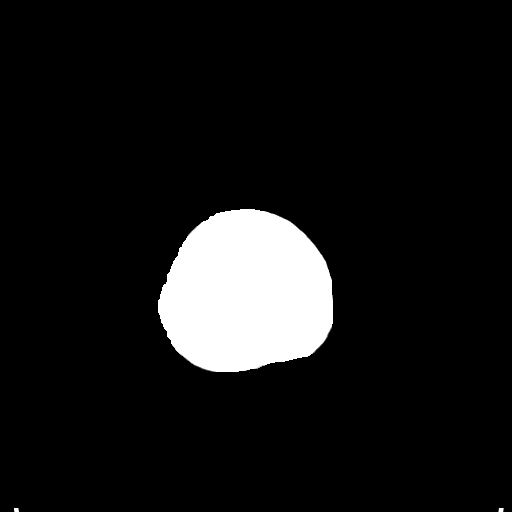

[Series 4: coronal soft · coronal · 0.36mm/px · 3 of 69 slices shown]
[im 23/69  brain]
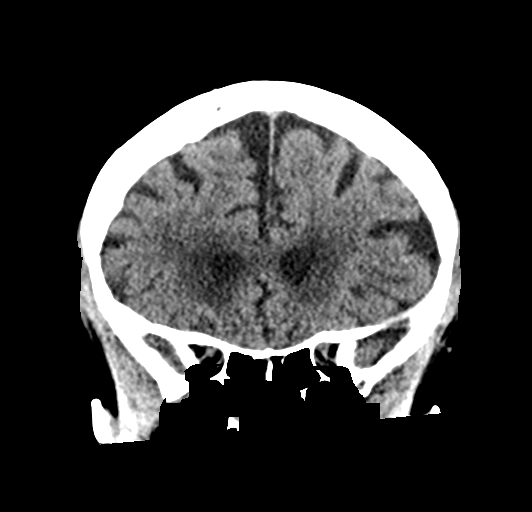
[im 31/69  brain]
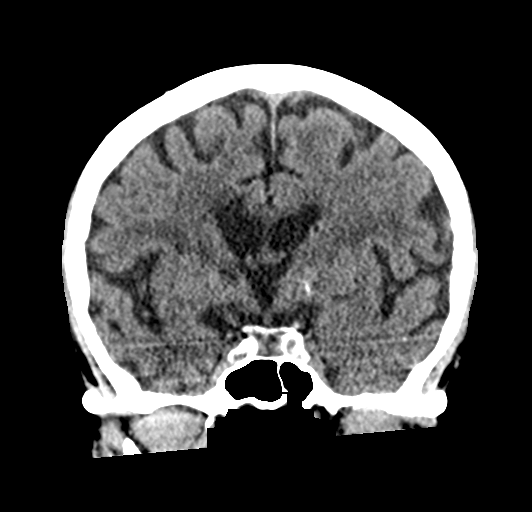
[im 38/69  brain]
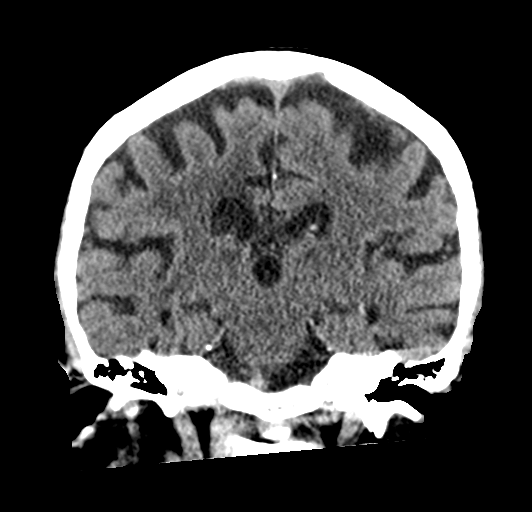

[Series 5: sagittal soft · sagittal · 0.36mm/px · 3 of 56 slices shown]
[im 19/56  brain]
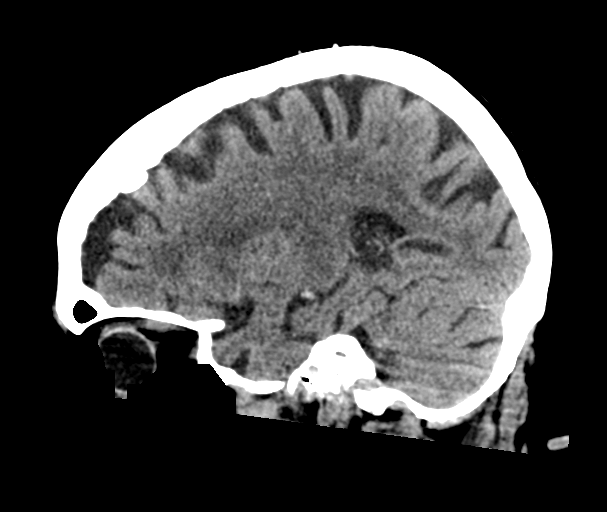
[im 28/56  brain]
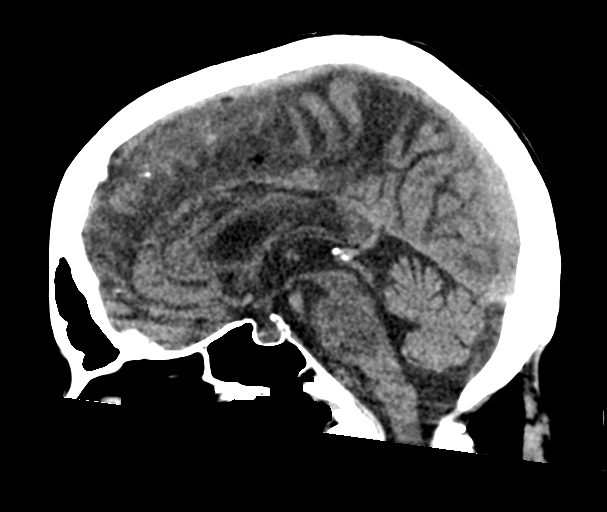
[im 37/56  brain]
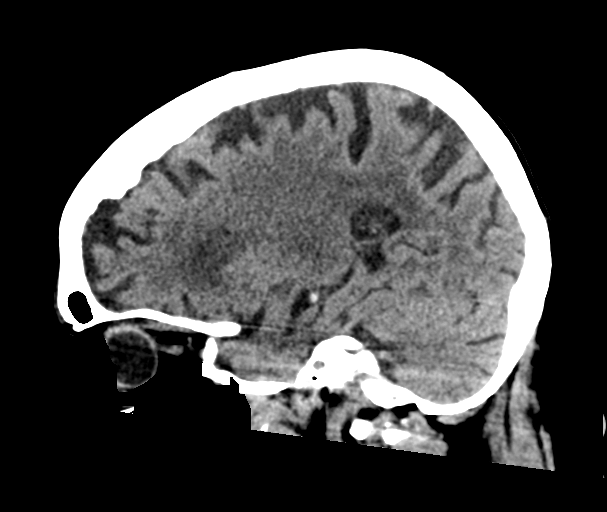

[16 of 47 positions shown; findings below may reference images not displayed]

FINDINGS: Brain: No evidence of acute large vascular territory infarction,
hemorrhage, hydrocephalus, extra-axial collection or mass
lesion/mass effect. Age related global parenchymal volume loss.
Similar moderate burden of chronic ischemic small vessel white
matter disease. Remote lacunar type infarcts within the genu of the
right internal capsule. Mineralization within the left greater than
right basal ganglia.

Vascular: No hyperdense vessel. Atherosclerotic calcifications of
the internal carotid and vertebral arteries at skull base.

Skull: Hyperostosis interna. Diffuse demineralization of bone.
Negative for fracture or focal lesion.

Sinuses/Orbits: The visualized paranasal sinuses and mastoid air
cells are predominantly clear. Orbits are grossly unremarkable.

Other: None
IMPRESSION: 1. No evidence of acute intracranial abnormality.
2. Stable age related global parenchymal volume loss, chronic
ischemic small vessel white matter disease, and remote lacunar type
infarcts within the genu of the right internal capsule.

## 2020-11-13 IMAGING — DX DG HIP (WITH OR WITHOUT PELVIS) 3-4V BILAT
5 series · 5 of 5 positions shown · non-contrast
Comparison: None.

CLINICAL DATA: Initial evaluation for acute trauma, fall.

EXAM:
DG HIP (WITH OR WITHOUT PELVIS) 3-4V BILAT

[pelvis ap]
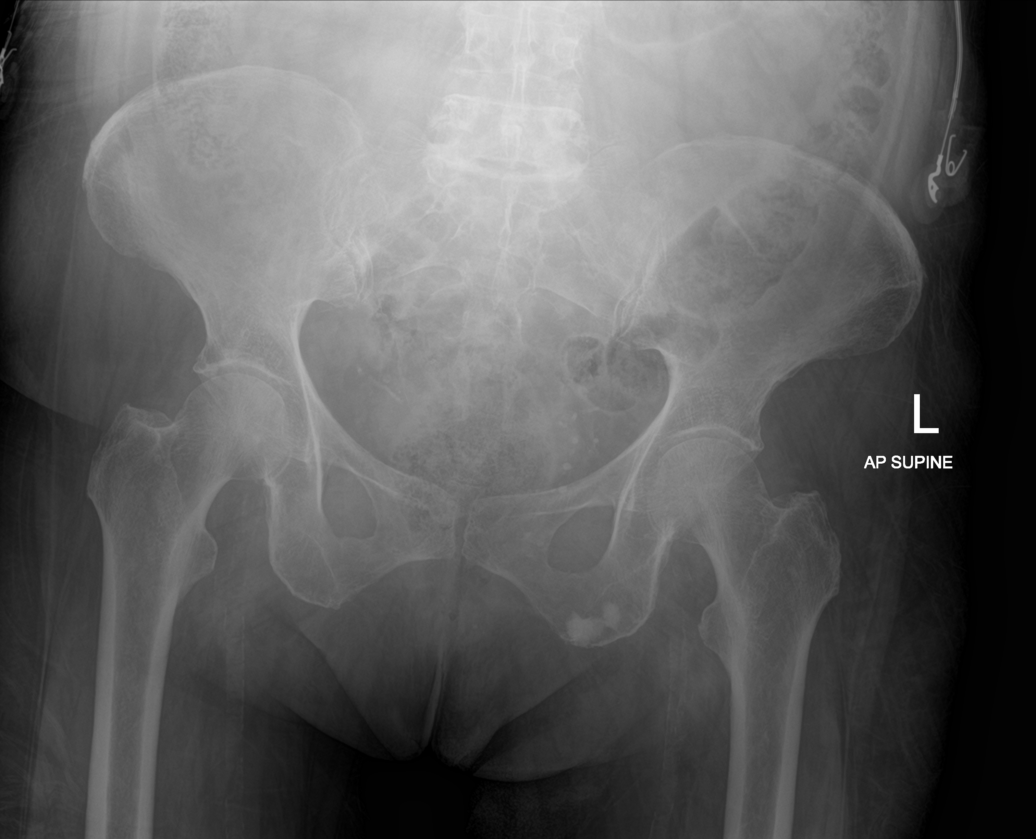

[hip ap (1 of 2)]
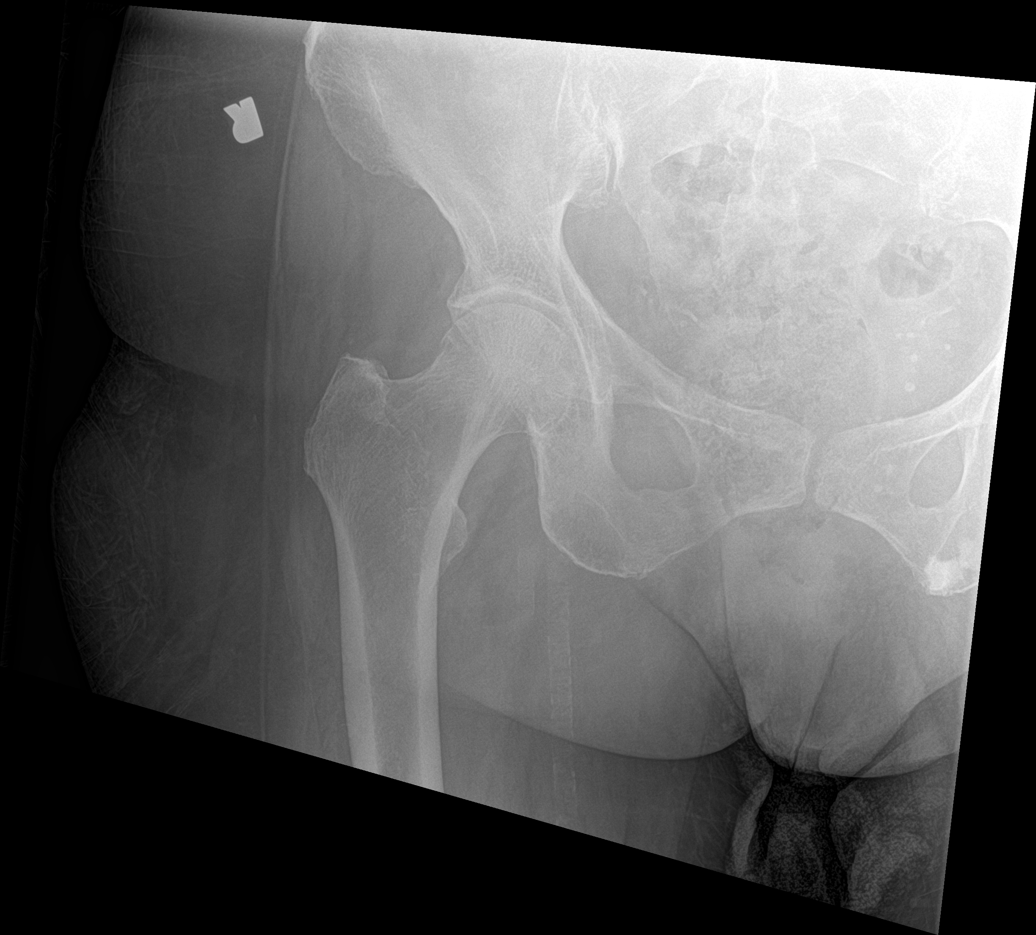

[hip lat (1 of 2)]
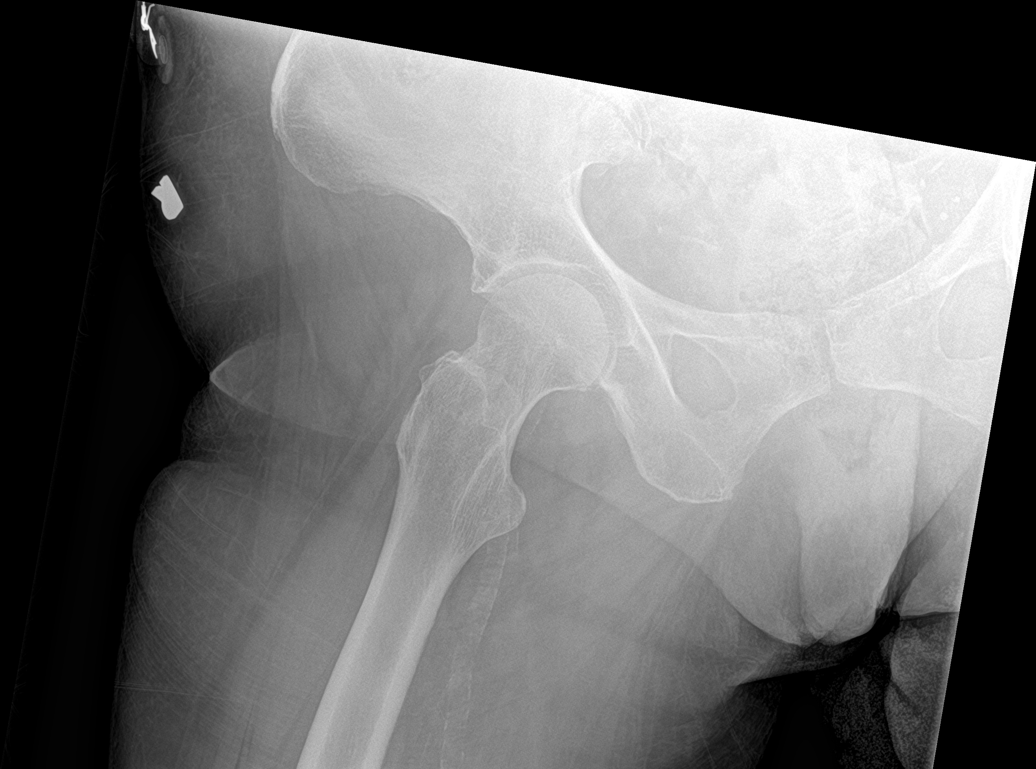

[hip ap (2 of 2)]
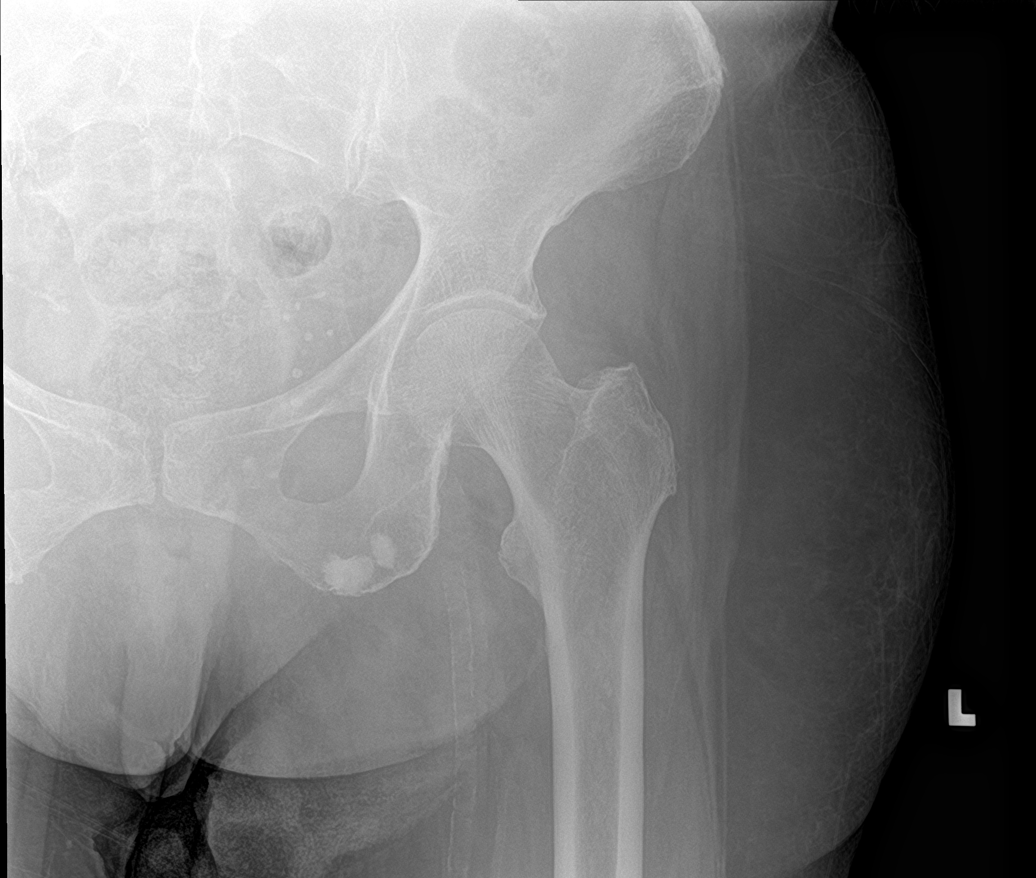

[hip lat (2 of 2)]
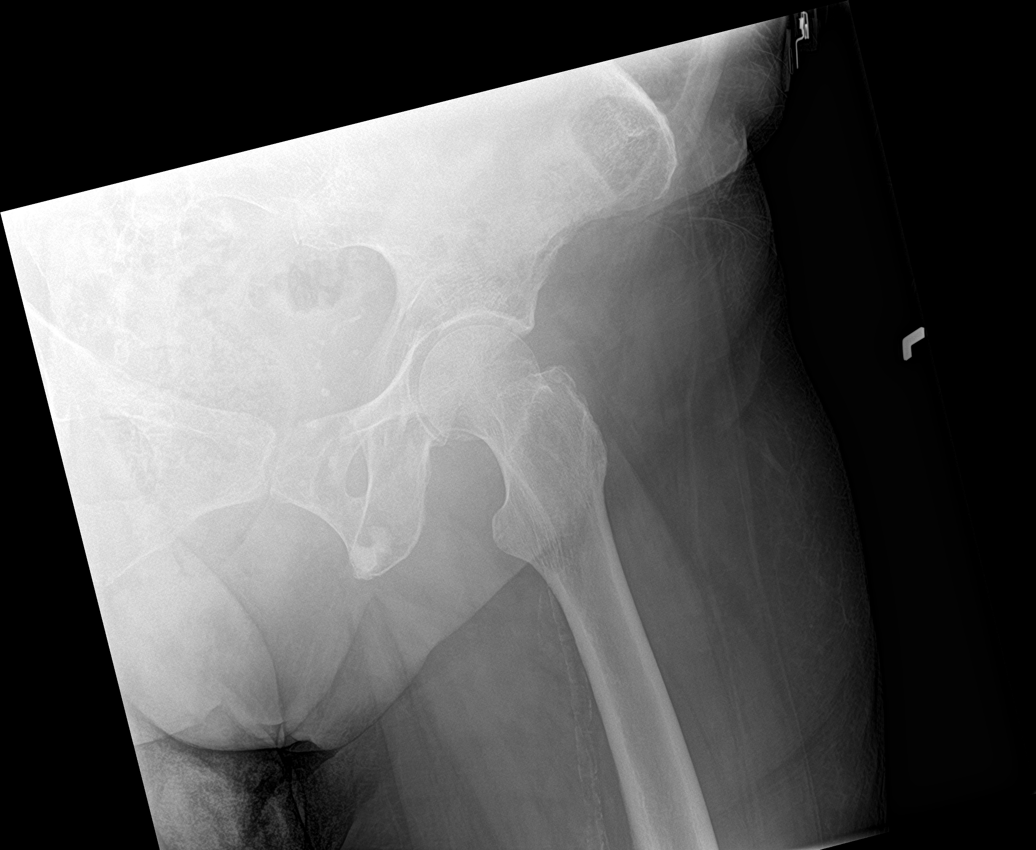

[5 of 5 positions shown; findings below may reference images not displayed]

FINDINGS: No acute fracture dislocation. Femoral heads in normal alignment
within the acetabula. Bony pelvis intact. No appreciable soft tissue
injury.

Two adjacent sclerotic foci overlying the inferior left pubic ramus
measure up to 1.5 cm, nonspecific, but suspected to reflect benign
bone islands.

Moderate stool seen within the rectal vault.

Degenerative spondylosis noted within the lower lumbar spine.
IMPRESSION: 1. No acute osseous abnormality about the hips.
2. Moderate stool within the rectal vault, suggesting constipation.

## 2020-11-13 IMAGING — DX DG SHOULDER 2+V*L*
2 series · 2 of 2 positions shown · non-contrast
Comparison: None.

CLINICAL DATA: Initial evaluation for acute trauma, fall.

EXAM:
LEFT SHOULDER - 2+ VIEW

[shoulder grashey]
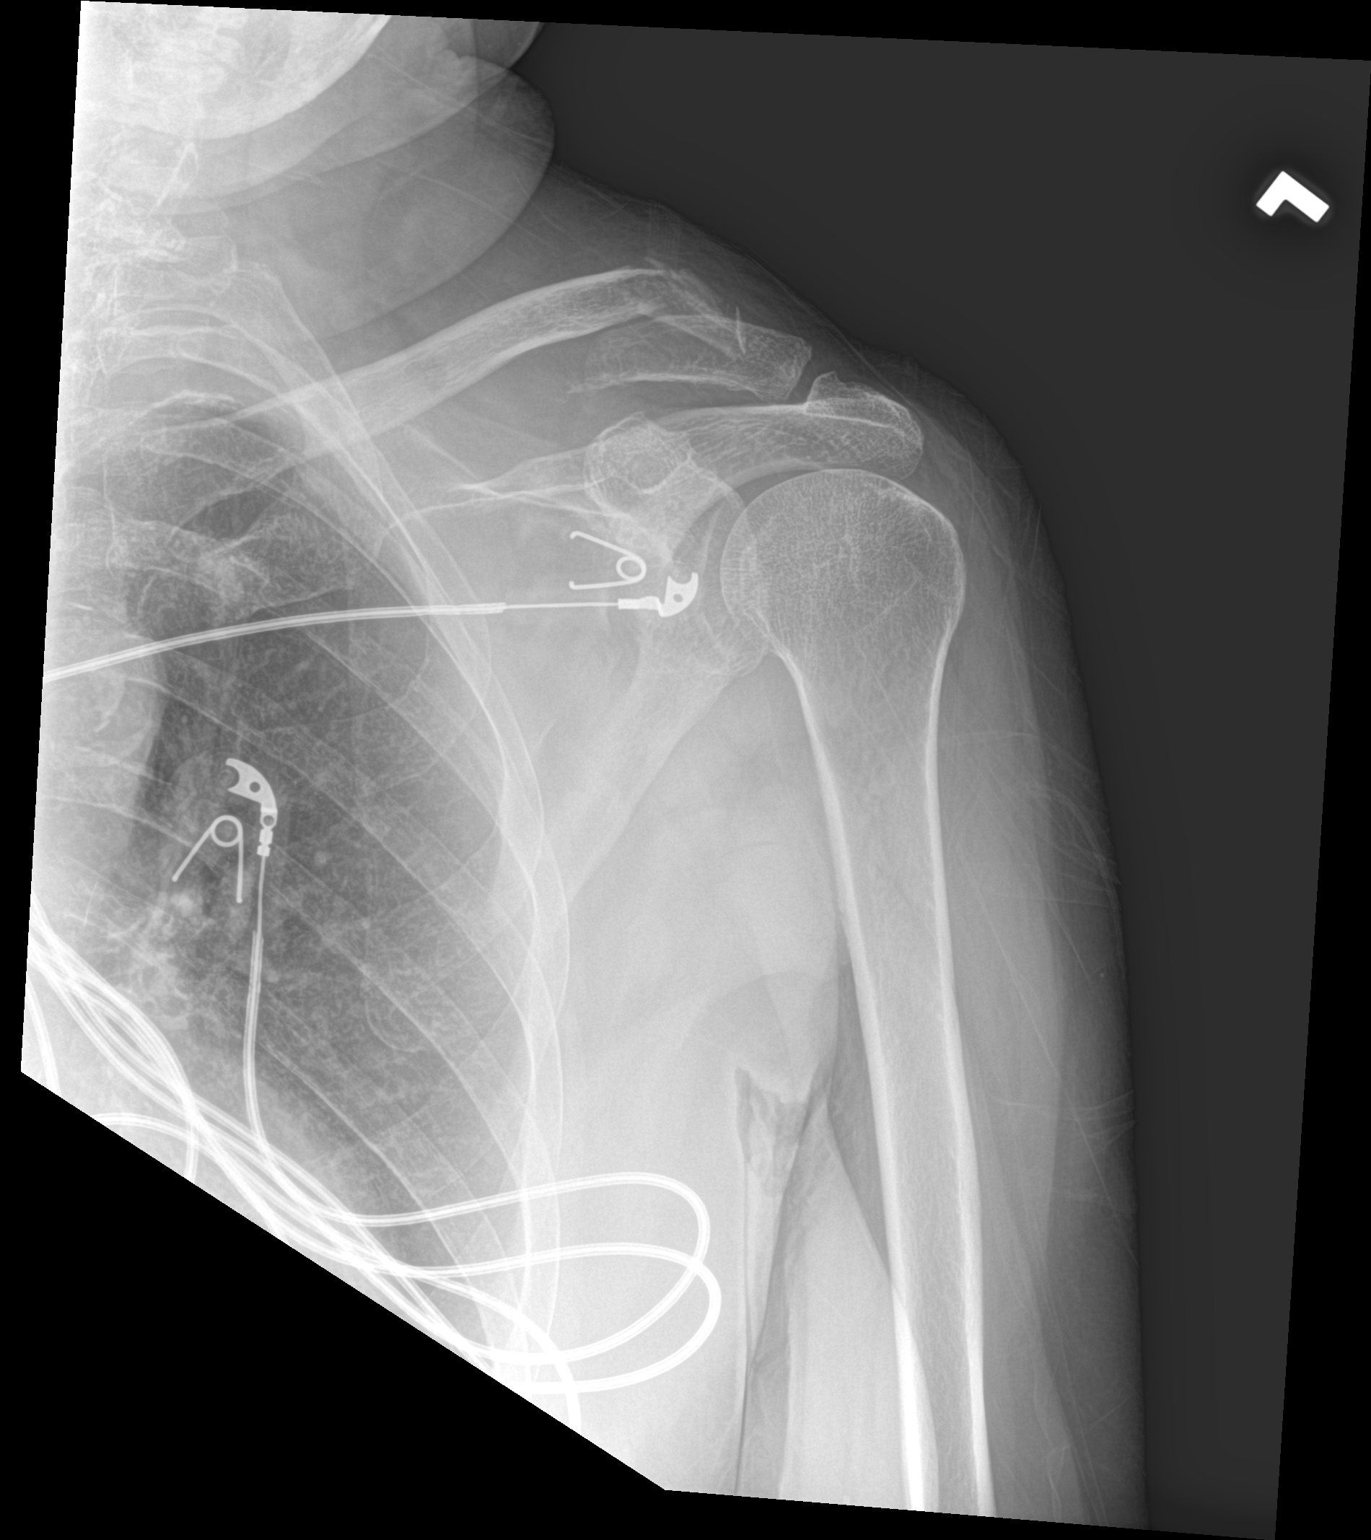

[shoulder y view]
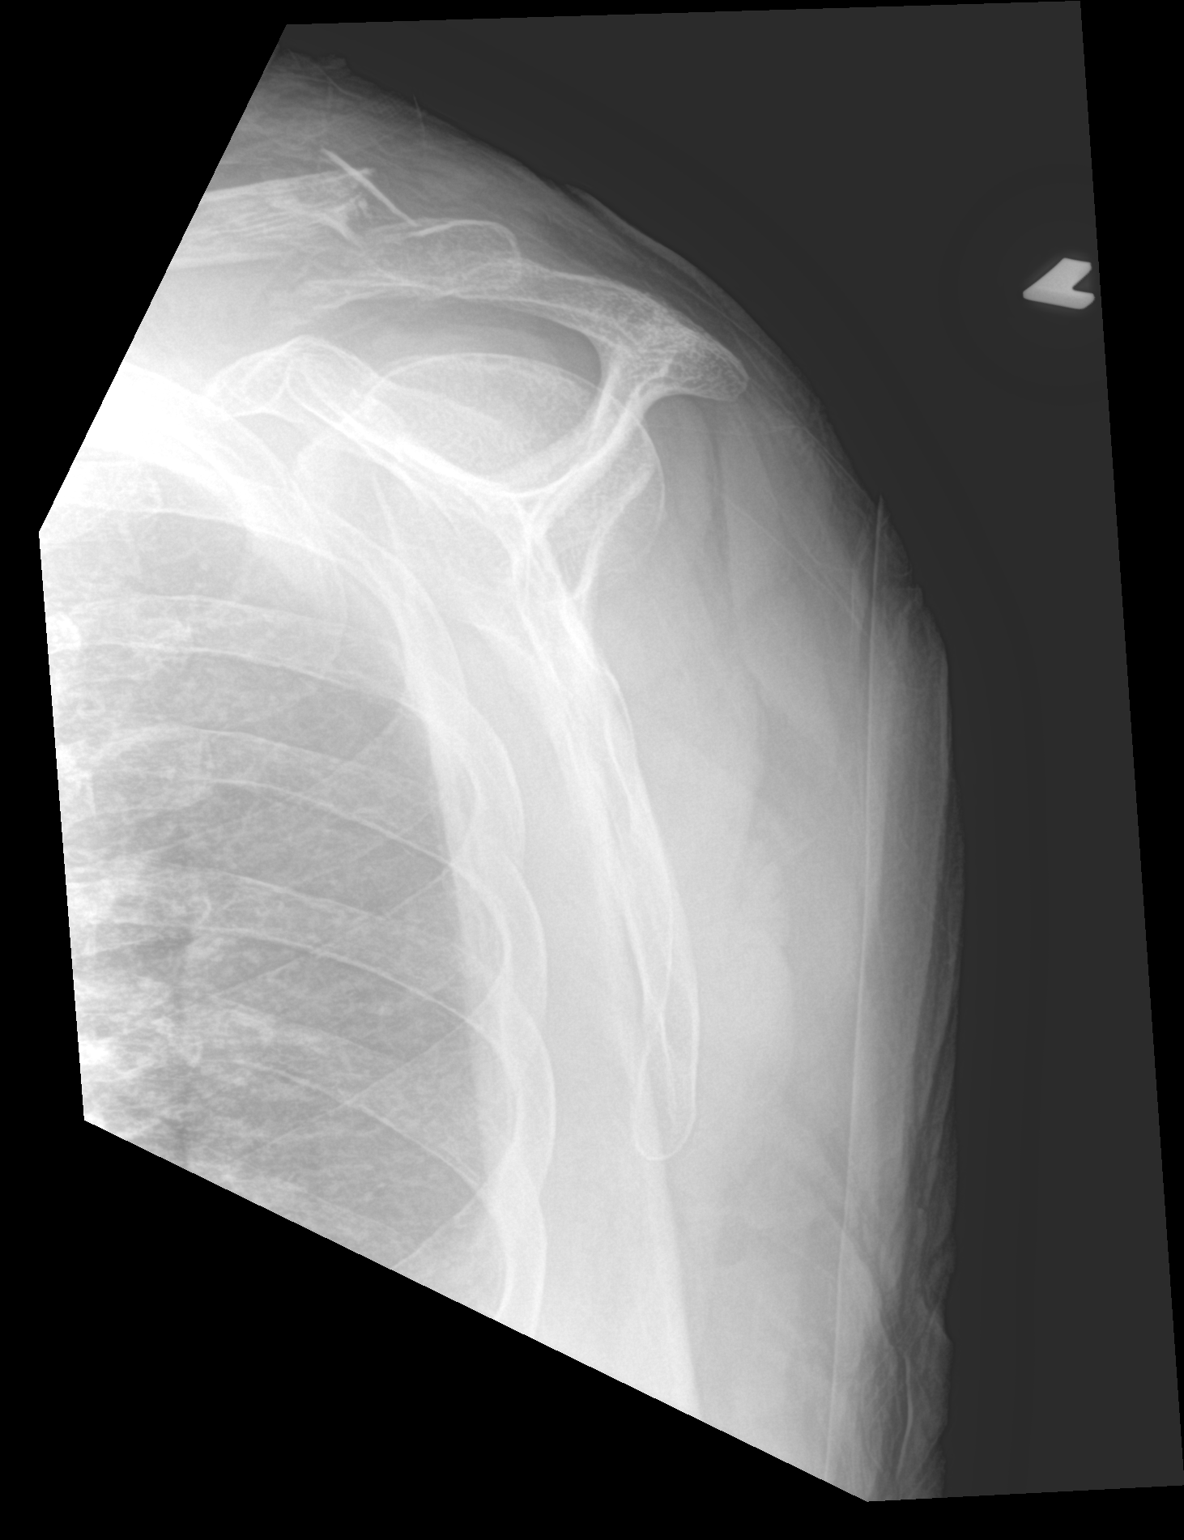

[2 of 2 positions shown; findings below may reference images not displayed]

FINDINGS: Acute comminuted oblique fracture extends through the distal left
clavicle with superior displacement and overlap. No other acute
osseous abnormality. Mild soft tissue swelling overlies the
shoulder. Osteopenia. Visualized left hemithorax clear.
IMPRESSION: Acute comminuted oblique fracture through the distal left clavicle
with overlap.

## 2020-11-13 IMAGING — CT CT CERVICAL SPINE W/O CM
3 series · 12 of 33 positions shown, 14 images · non-contrast
Comparison: None.

CLINICAL DATA: Unwitnessed fall, dementia, neck injury, right arm
and left shoulder pain.

EXAM:
CT CERVICAL SPINE WITHOUT CONTRAST
TECHNIQUE: Multidetector CT imaging of the cervical spine was performed without
intravenous contrast. Multiplanar CT image reconstructions were also
generated.

[Series 4: c spine soft · axial · 0.39mm/px · z∈[-94,+6]mm · 4 of 74 slices shown, 5 images]
[im 12/74  soft-tissue]
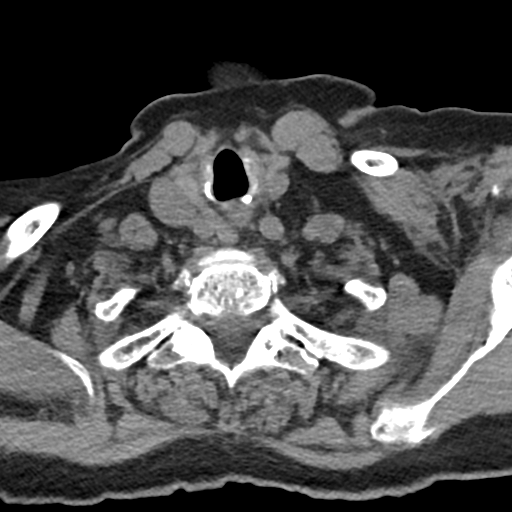
[im 12/74  bone]
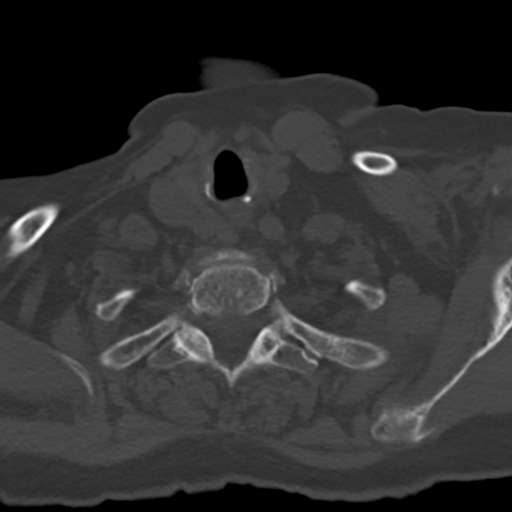
[im 29/74  bone]
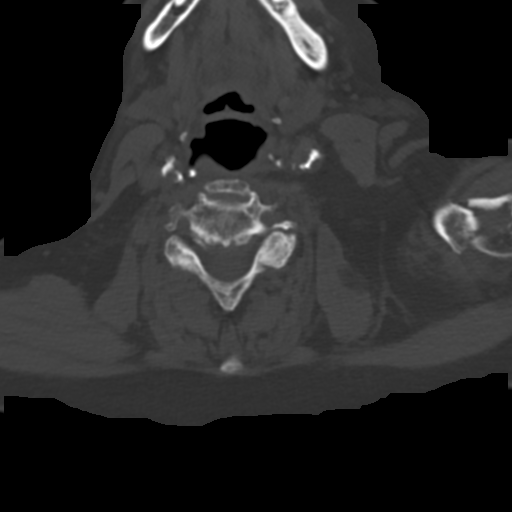
[im 45/74  bone]
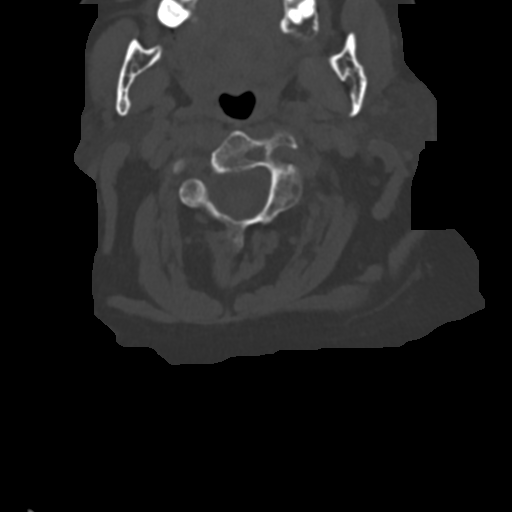
[im 62/74  bone]
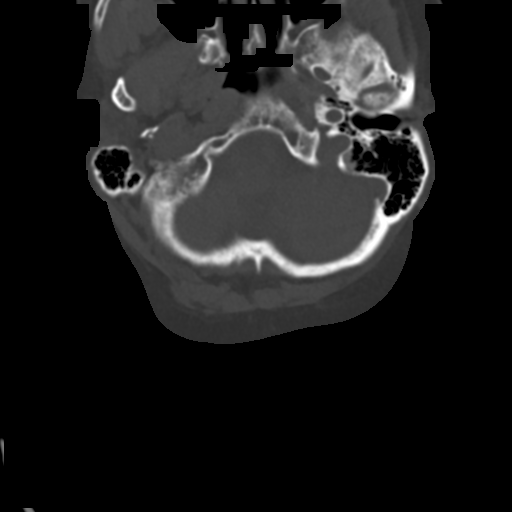

[Series 5: sagittal bone · sagittal · 0.26mm/px · 5 of 61 slices shown, 6 images]
[im 21/61  bone]
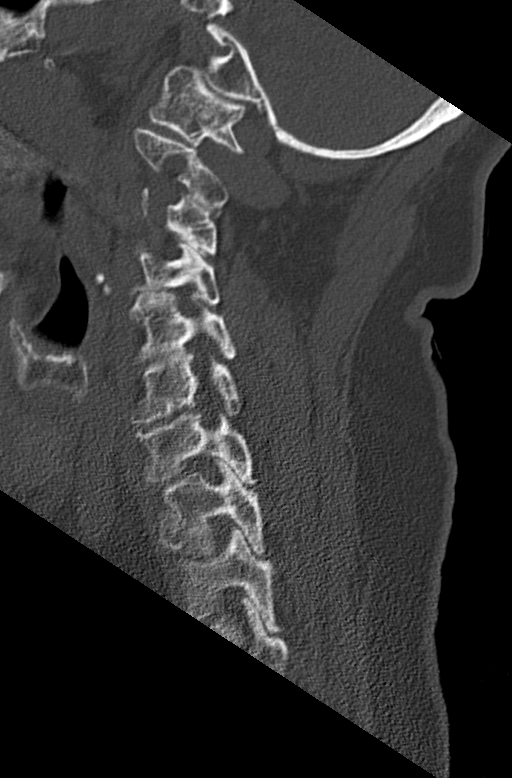
[im 26/61  bone]
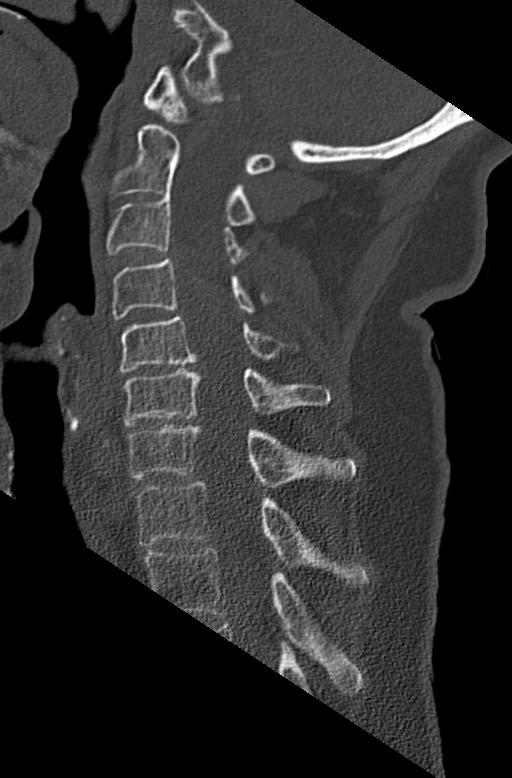
[im 31/61  soft-tissue]
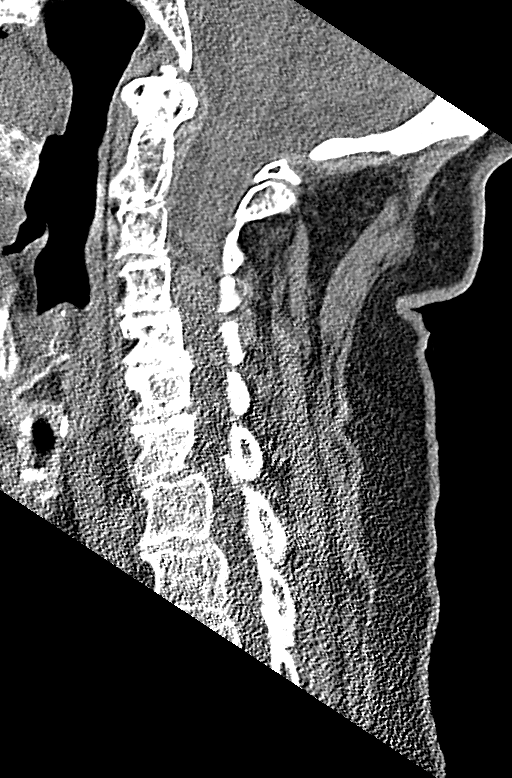
[im 31/61  bone]
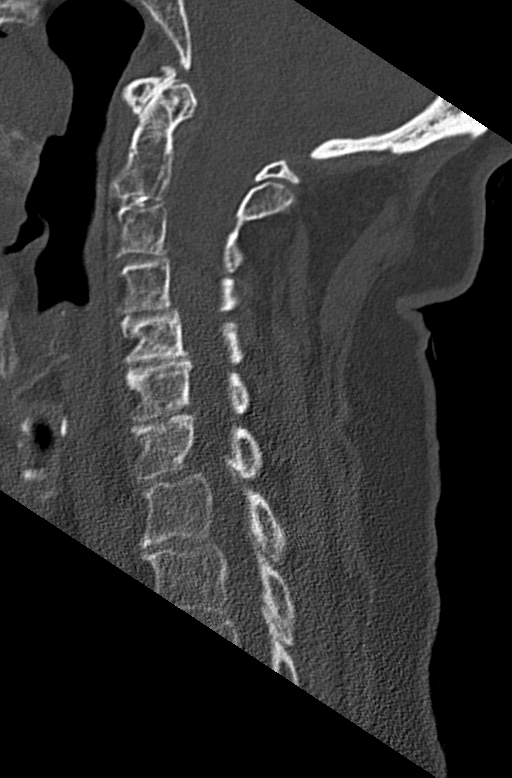
[im 36/61  bone]
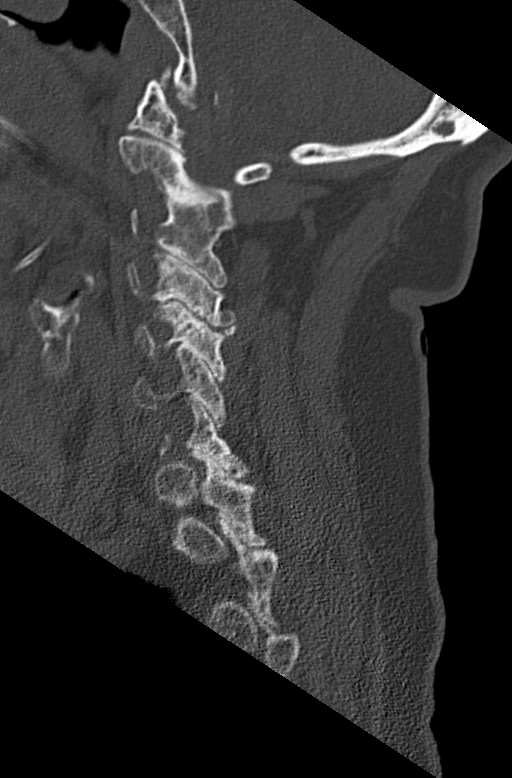
[im 41/61  bone]
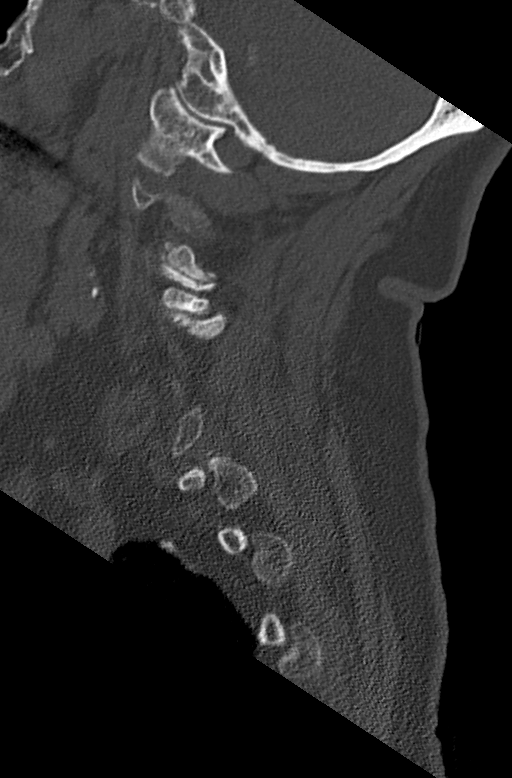

[Series 6: coronal bone · coronal · 0.26mm/px · 3 of 61 slices shown]
[im 17/61  bone]
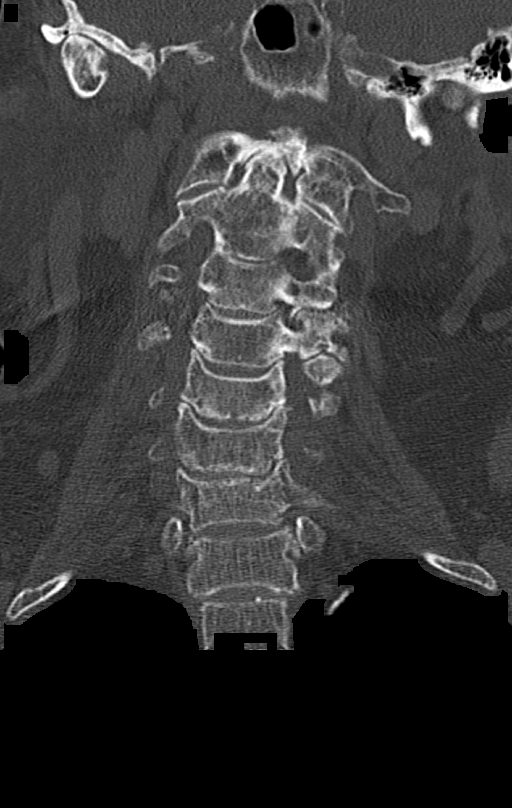
[im 26/61  bone]
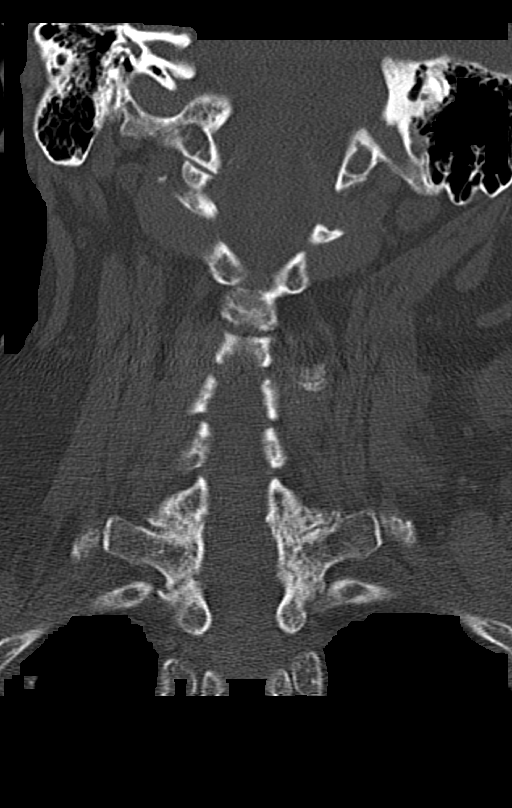
[im 35/61  bone]
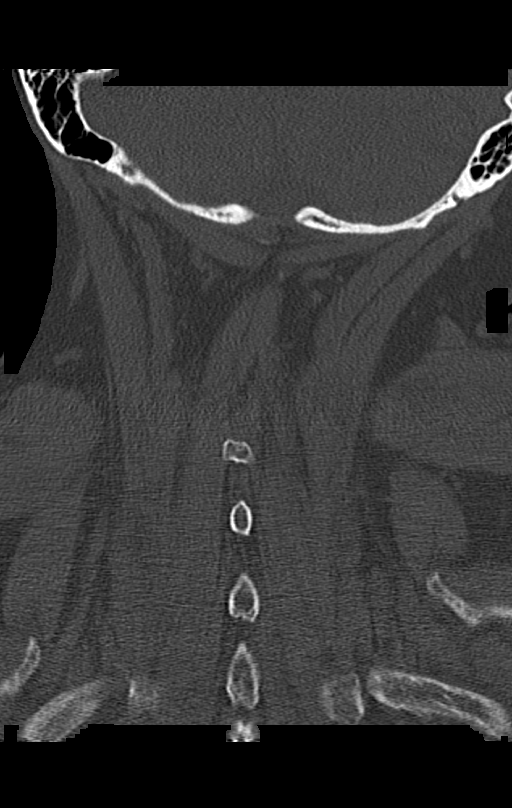

[12 of 33 positions shown; findings below may reference images not displayed]

FINDINGS: Alignment: Mild straightening of the cervical spine. 2 mm
anterolisthesis of C4 upon C5 and C7 upon T1 is likely degenerative
in nature.

Skull base and vertebrae: The craniocervical junction is
unremarkable. The atlantodental interval is not widened.
Degenerative changes are noted at the atlantodental articulation.
There is ankylosis of the facet joints of C2 and C3 bilaterally. No
acute fracture of the cervical spine.

Soft tissues and spinal canal: No prevertebral fluid or swelling. No
visible canal hematoma.

Disc levels: There is intervertebral disc space narrowing and
endplate remodeling of C5-C7 in keeping with changes of moderate
degenerative disc disease. Degenerative disc calcification are also
noted at C2-3. Mild endplate remodeling is noted throughout the
remainder of the cervical spine in keeping with changes of minimal
degenerative disc disease at these levels. The prevertebral soft
tissues are not thickened. The spinal canal appears widely patent.
Review of the axial images demonstrates multilevel uncovertebral and
facet arthrosis resulting in multilevel mild-to-moderate
neuroforaminal narrowing, most severe on the left at C3-4 and C4-5.

Upper chest: Unremarkable

Other: Multiple nodules are noted within the left thyroid gland
measuring up to 2 cm in greatest dimension. Advanced vascular
calcifications are noted within the carotid bifurcations
bilaterally.
IMPRESSION: No acute fracture or listhesis of the cervical spine.

Multilevel degenerative disc and degenerative joint disease
resulting in multilevel neuroforaminal narrowing, most severe on the
left at C3-4 and C4-5.

Peripheral vascular disease. The degree of vascular stenosis
involving the carotid bifurcations would be better assessed with
dedicated sonography if indicated.

Multiple left thyroid nodules measuring up to 2 cm, not well
characterized on this examination. Recommend thyroid US (ref: [HOSPITAL]. [DATE]): 143-50).

## 2020-11-13 NOTE — ED Triage Notes (Signed)
BIB EMS from [place] after unwitnessed mechanical fall. Pt c/o head pain earlier in the day, currently c/o of R arm and L shoulder pain. Hx of dementia.

## 2020-11-13 NOTE — ED Provider Notes (Signed)
[facility] EMERGENCY DEPARTMENT Provider Note   CSN: [id number] Arrival date & time: 11/13/20  2121     History Chief Complaint  Patient presents with  . Fall    Julia [name] is a 87 y.o. female.  Patient brought in by EMS from [place].  Patient had unwitnessed mechanical fall.  Patient complained of head pain and is complaining of some neck pain is complaining of left shoulder left forearm pain.  Currently not complaining of any right arm pain.  Patient has a history of dementia.  Patient not on any blood thinners.  Patient's past medical history significant for Alzheimer's disease CVA hypertension        Past Medical History:  Diagnosis Date  . Alzheimer disease ([place])   . Anxiety   . CVA (cerebral vascular accident) ([place])   . Hypertension     There are no problems to display for this patient.     OB History   No obstetric history on file.     No family history on file.  Social History   Tobacco Use  . Smoking status: Unknown If Ever Smoked    Home Medications Prior to Admission medications   Not on File    Allergies    Sulfa antibiotics  Review of Systems   Review of Systems  Unable to perform ROS: Dementia    Physical Exam Updated Vital Signs BP (!) 189/82   Pulse 88   Temp 98.4 F (36.9 C)   Resp 19   Ht 1.676 m (5\' 6" )   Wt 72 kg   SpO2 97%   BMI 25.61 kg/m   Physical Exam Vitals and nursing note reviewed.  Constitutional:      General: She is not in acute distress.    Appearance: Normal appearance. She is well-developed.  HENT:     Head: Normocephalic and atraumatic.  Eyes:     Extraocular Movements: Extraocular movements intact.     Conjunctiva/sclera: Conjunctivae normal.     Pupils: Pupils are equal, round, and reactive to light.  Neck:     Comments: Some tenderness to the posterior cervical spine more so on the right side. Cardiovascular:     Rate and Rhythm: Normal rate and regular rhythm.      Heart sounds: No murmur heard.   Pulmonary:     Effort: Pulmonary effort is normal. No respiratory distress.     Breath sounds: Normal breath sounds.  Abdominal:     Palpations: Abdomen is soft.     Tenderness: There is no abdominal tenderness.  Musculoskeletal:        General: Tenderness present.     Cervical back: Normal range of motion and neck supple. Tenderness present.     Comments: Some tenderness to palpation to distal left forearm and left shoulder area.  No discomfort with range of motion to the right upper extremity.  No discomfort with range of motion of the lower extremities.  But patient has significant weakness to the left lower extremity secondary to previous stroke.  Patient's radial pulse left upper extremity is 2+.  Radial pulse right upper extremity 2+.  Good cap refill to fingers and toes.  Skin:    General: Skin is warm and dry.     Capillary Refill: Capillary refill takes less than 2 seconds.  Neurological:     General: No focal deficit present.     Mental Status: She is alert. Mental status is at baseline.  Cranial Nerves: No cranial nerve deficit.     Sensory: No sensory deficit.     Motor: No weakness.     Comments: Except for baseline weakness to the left lower extremity.     ED Results / Procedures / Treatments   Labs (all labs ordered are listed, but only abnormal results are displayed) Labs Reviewed - No data to display  EKG None  Radiology No results found.  Procedures Procedures   Medications Ordered in ED Medications - No data to display  ED Course  I have reviewed the triage vital signs and the nursing notes.  Pertinent labs & imaging results that were available during my care of the patient were reviewed by me and considered in my medical decision making (see chart for details).    MDM Rules/Calculators/A&P                          Patient will go get CT head CT neck x-ray of left shoulder left forearm.  And also x-ray of both  hips and pelvis.  Disposition will be based on these x-ray findings.   Final Clinical Impression(s) / ED Diagnoses Final diagnoses:  Fall, initial encounter    Rx / DC Orders ED Discharge Orders    None       Fredia Sorrow, MD 11/14/20 [phone]

## 2020-11-13 NOTE — ED Notes (Signed)
Pt has bruise to R posterior thigh but states it's from a previous fall.

## 2020-11-14 DIAGNOSIS — S42009A Fracture of unspecified part of unspecified clavicle, initial encounter for closed fracture: Secondary | ICD-10-CM | POA: Diagnosis not present

## 2020-11-14 DIAGNOSIS — R296 Repeated falls: Secondary | ICD-10-CM | POA: Diagnosis not present

## 2020-11-14 DIAGNOSIS — G301 Alzheimer's disease with late onset: Secondary | ICD-10-CM | POA: Diagnosis not present

## 2020-11-14 DIAGNOSIS — I69354 Hemiplegia and hemiparesis following cerebral infarction affecting left non-dominant side: Secondary | ICD-10-CM | POA: Diagnosis not present

## 2020-11-14 NOTE — ED Notes (Signed)
Report called to [facility]. Daughter-in-law called, updated on discharge.

## 2020-11-14 NOTE — Discharge Instructions (Signed)
Peripheral vascular disease. The degree of vascular stenosis  involving the carotid bifurcations would be better assessed with  dedicated sonography if indicated.   Multiple left thyroid nodules measuring up to 2 cm, not well  characterized on this examination. Recommend thyroid US (ref: J Am  Coll Radiol. 2015 Feb;12(2): 143-50).

## 2020-11-14 NOTE — ED Provider Notes (Signed)
1:48 AM Assumed care from Dr. Rogene Houston, please see their note for full history, physical and decision making until this point. In brief this is a 87 y.o. year old female who presented to the ED tonight with Fall     Pending CT scans to r/o traumatic injury.   Found to have left clavicular fracture. Sling placed. Will fu w/ orthopedics.   Discharge instructions, including strict return precautions for new or worsening symptoms, given. Patient and/or family verbalized understanding and agreement with the plan as described.   Labs, studies and imaging reviewed by myself and considered in medical decision making if ordered. Imaging interpreted by radiology.  Labs Reviewed - No data to display  CT Head Wo Contrast  Final Result    CT Cervical Spine Wo Contrast  Final Result    DG Hips Bilat W or Wo Pelvis 3-4 Views  Final Result    DG Forearm Left  Final Result    DG Shoulder Left  Final Result      No follow-ups on file.    Makael Stein, Corene Cornea, MD 11/14/20 [phone]

## 2020-11-20 DIAGNOSIS — E1122 Type 2 diabetes mellitus with diabetic chronic kidney disease: Secondary | ICD-10-CM | POA: Diagnosis not present

## 2020-11-20 DIAGNOSIS — F0280 Dementia in other diseases classified elsewhere without behavioral disturbance: Secondary | ICD-10-CM | POA: Diagnosis not present

## 2020-11-20 DIAGNOSIS — R296 Repeated falls: Secondary | ICD-10-CM | POA: Diagnosis not present

## 2020-11-20 DIAGNOSIS — I10 Essential (primary) hypertension: Secondary | ICD-10-CM | POA: Diagnosis not present

## 2020-11-20 DIAGNOSIS — S42009A Fracture of unspecified part of unspecified clavicle, initial encounter for closed fracture: Secondary | ICD-10-CM | POA: Diagnosis not present

## 2020-11-22 DIAGNOSIS — F4323 Adjustment disorder with mixed anxiety and depressed mood: Secondary | ICD-10-CM | POA: Diagnosis not present

## 2020-11-22 DIAGNOSIS — F0280 Dementia in other diseases classified elsewhere without behavioral disturbance: Secondary | ICD-10-CM | POA: Diagnosis not present

## 2020-11-22 DIAGNOSIS — G301 Alzheimer's disease with late onset: Secondary | ICD-10-CM | POA: Diagnosis not present

## 2020-11-22 DIAGNOSIS — E1151 Type 2 diabetes mellitus with diabetic peripheral angiopathy without gangrene: Secondary | ICD-10-CM | POA: Diagnosis not present

## 2020-11-28 DIAGNOSIS — M810 Age-related osteoporosis without current pathological fracture: Secondary | ICD-10-CM | POA: Diagnosis not present

## 2020-11-28 DIAGNOSIS — E7849 Other hyperlipidemia: Secondary | ICD-10-CM | POA: Diagnosis not present

## 2020-11-28 DIAGNOSIS — I10 Essential (primary) hypertension: Secondary | ICD-10-CM | POA: Diagnosis not present

## 2020-12-02 ENCOUNTER — Ambulatory Visit (INDEPENDENT_AMBULATORY_CARE_PROVIDER_SITE_OTHER): Payer: Medicare Other | Admitting: Physician Assistant

## 2020-12-02 ENCOUNTER — Ambulatory Visit: Payer: Medicare Other | Admitting: Surgical

## 2020-12-02 ENCOUNTER — Ambulatory Visit (INDEPENDENT_AMBULATORY_CARE_PROVIDER_SITE_OTHER): Payer: Medicare Other

## 2020-12-02 ENCOUNTER — Encounter: Payer: Self-pay | Admitting: Physician Assistant

## 2020-12-02 DIAGNOSIS — E039 Hypothyroidism, unspecified: Secondary | ICD-10-CM | POA: Diagnosis not present

## 2020-12-02 DIAGNOSIS — S42001A Fracture of unspecified part of right clavicle, initial encounter for closed fracture: Secondary | ICD-10-CM

## 2020-12-02 DIAGNOSIS — S42002A Fracture of unspecified part of left clavicle, initial encounter for closed fracture: Secondary | ICD-10-CM

## 2020-12-02 NOTE — Progress Notes (Signed)
   Office Visit Note   Patient: Julia [name]           Date of Birth: Jan 01, 1928           MRN: [id number] Visit Date: 12/02/2020              Requested by: No referring provider defined for this encounter. PCP: Pcp, No  No chief complaint on file.     HPI: Patient is a pleasant 87 year old woman who resides in a memory care nursing facility.  She is 2 weeks status falling.  She was seen and evaluated in the emergency room diagnosed with a left clavicle fracture.  She is here for follow-up with a staff member.  Staff member reports that she is not having any pain and is folding laundry.  She uses a sling but does not find that is very helpful  Assessment & Plan: Visit Diagnoses:  1. Closed nondisplaced fracture of right clavicle, unspecified part of clavicle, initial encounter     Plan: Displaced distal clavicle fracture.  Patient is currently asymptomatic.  May follow-up in 4 weeks if any concerns  Follow-Up Instructions: No follow-ups on file.   Ortho Exam  Patient is alert, oriented, no adenopathy, well-dressed, normal affect, normal respiratory effort. Examination skin is intact.  Patient moves her arm without difficulty.  Has good grip strength.  No paresthesias.  X-rays demonstrate distal third clavicle fracture with 100% displacement and fragmentation.  Humeral head is reduced in the glenoid  Imaging: No results found. No images are attached to the encounter.  Labs: Lab Results  Component Value Date   REPTSTATUS 11/01/2020 FINAL 10/31/2020   CULT MULTIPLE SPECIES PRESENT, SUGGEST RECOLLECTION (A) 10/31/2020     No results found for: ALBUMIN, PREALBUMIN, CBC  No results found for: MG No results found for: VD25OH  No results found for: PREALBUMIN No flowsheet data found.   There is no height or weight on file to calculate BMI.  Orders:  Orders Placed This Encounter  Procedures  . XR Clavicle Left   No orders of the defined types were placed in this  encounter.    Procedures: No procedures performed  Clinical Data: No additional findings.  ROS:  All other systems negative, except as noted in the HPI. Review of Systems  Objective: Vital Signs: There were no vitals taken for this visit.  Specialty Comments:  No specialty comments available.  PMFS History: There are no problems to display for this patient.  Past Medical History:  Diagnosis Date  . Alzheimer disease ([place])   . Anxiety   . CVA (cerebral vascular accident) ([place])   . Hypertension     No family history on file.   Social History   Occupational History  . Not on file  Tobacco Use  . Smoking status: Unknown If Ever Smoked  . Smokeless tobacco: Not on file  Substance and Sexual Activity  . Alcohol use: Not on file  . Drug use: Not on file  . Sexual activity: Not on file

## 2020-12-06 DIAGNOSIS — F4323 Adjustment disorder with mixed anxiety and depressed mood: Secondary | ICD-10-CM | POA: Diagnosis not present

## 2020-12-21 DIAGNOSIS — S42009D Fracture of unspecified part of unspecified clavicle, subsequent encounter for fracture with routine healing: Secondary | ICD-10-CM | POA: Diagnosis not present

## 2020-12-21 DIAGNOSIS — I69354 Hemiplegia and hemiparesis following cerebral infarction affecting left non-dominant side: Secondary | ICD-10-CM | POA: Diagnosis not present

## 2020-12-21 DIAGNOSIS — R296 Repeated falls: Secondary | ICD-10-CM | POA: Diagnosis not present

## 2020-12-21 DIAGNOSIS — E1122 Type 2 diabetes mellitus with diabetic chronic kidney disease: Secondary | ICD-10-CM | POA: Diagnosis not present

## 2020-12-21 DIAGNOSIS — N1831 Chronic kidney disease, stage 3a: Secondary | ICD-10-CM | POA: Diagnosis not present

## 2020-12-21 DIAGNOSIS — F4321 Adjustment disorder with depressed mood: Secondary | ICD-10-CM | POA: Diagnosis not present

## 2020-12-21 DIAGNOSIS — F0280 Dementia in other diseases classified elsewhere without behavioral disturbance: Secondary | ICD-10-CM | POA: Diagnosis not present

## 2020-12-21 DIAGNOSIS — E559 Vitamin D deficiency, unspecified: Secondary | ICD-10-CM | POA: Diagnosis not present

## 2020-12-21 DIAGNOSIS — K449 Diaphragmatic hernia without obstruction or gangrene: Secondary | ICD-10-CM | POA: Diagnosis not present

## 2020-12-21 DIAGNOSIS — I10 Essential (primary) hypertension: Secondary | ICD-10-CM | POA: Diagnosis not present

## 2020-12-21 DIAGNOSIS — M810 Age-related osteoporosis without current pathological fracture: Secondary | ICD-10-CM | POA: Diagnosis not present

## 2020-12-29 DIAGNOSIS — F4321 Adjustment disorder with depressed mood: Secondary | ICD-10-CM | POA: Diagnosis not present

## 2020-12-29 DIAGNOSIS — F0280 Dementia in other diseases classified elsewhere without behavioral disturbance: Secondary | ICD-10-CM | POA: Diagnosis not present

## 2020-12-29 DIAGNOSIS — G301 Alzheimer's disease with late onset: Secondary | ICD-10-CM | POA: Diagnosis not present

## 2020-12-30 DIAGNOSIS — E1122 Type 2 diabetes mellitus with diabetic chronic kidney disease: Secondary | ICD-10-CM | POA: Diagnosis not present

## 2021-01-13 DIAGNOSIS — F4321 Adjustment disorder with depressed mood: Secondary | ICD-10-CM | POA: Diagnosis not present

## 2021-01-17 DIAGNOSIS — F339 Major depressive disorder, recurrent, unspecified: Secondary | ICD-10-CM | POA: Diagnosis not present

## 2021-01-17 DIAGNOSIS — G301 Alzheimer's disease with late onset: Secondary | ICD-10-CM | POA: Diagnosis not present

## 2021-01-17 DIAGNOSIS — F0280 Dementia in other diseases classified elsewhere without behavioral disturbance: Secondary | ICD-10-CM | POA: Diagnosis not present

## 2021-01-18 DIAGNOSIS — F0280 Dementia in other diseases classified elsewhere without behavioral disturbance: Secondary | ICD-10-CM | POA: Diagnosis not present

## 2021-01-18 DIAGNOSIS — E1122 Type 2 diabetes mellitus with diabetic chronic kidney disease: Secondary | ICD-10-CM | POA: Diagnosis not present

## 2021-01-18 DIAGNOSIS — R296 Repeated falls: Secondary | ICD-10-CM | POA: Diagnosis not present

## 2021-01-18 DIAGNOSIS — K219 Gastro-esophageal reflux disease without esophagitis: Secondary | ICD-10-CM | POA: Diagnosis not present

## 2021-01-18 DIAGNOSIS — I10 Essential (primary) hypertension: Secondary | ICD-10-CM | POA: Diagnosis not present

## 2021-01-18 DIAGNOSIS — G309 Alzheimer's disease, unspecified: Secondary | ICD-10-CM | POA: Diagnosis not present

## 2021-01-18 DIAGNOSIS — I69354 Hemiplegia and hemiparesis following cerebral infarction affecting left non-dominant side: Secondary | ICD-10-CM | POA: Diagnosis not present

## 2021-01-18 DIAGNOSIS — M810 Age-related osteoporosis without current pathological fracture: Secondary | ICD-10-CM | POA: Diagnosis not present

## 2021-01-18 DIAGNOSIS — N1831 Chronic kidney disease, stage 3a: Secondary | ICD-10-CM | POA: Diagnosis not present

## 2021-01-18 DIAGNOSIS — E559 Vitamin D deficiency, unspecified: Secondary | ICD-10-CM | POA: Diagnosis not present

## 2021-01-31 DIAGNOSIS — F4321 Adjustment disorder with depressed mood: Secondary | ICD-10-CM | POA: Diagnosis not present

## 2021-02-14 DIAGNOSIS — E1151 Type 2 diabetes mellitus with diabetic peripheral angiopathy without gangrene: Secondary | ICD-10-CM | POA: Diagnosis not present

## 2021-02-14 DIAGNOSIS — F4321 Adjustment disorder with depressed mood: Secondary | ICD-10-CM | POA: Diagnosis not present

## 2021-02-14 DIAGNOSIS — L84 Corns and callosities: Secondary | ICD-10-CM | POA: Diagnosis not present

## 2021-02-15 DIAGNOSIS — K5901 Slow transit constipation: Secondary | ICD-10-CM | POA: Diagnosis not present

## 2021-02-15 DIAGNOSIS — G301 Alzheimer's disease with late onset: Secondary | ICD-10-CM | POA: Diagnosis not present

## 2021-02-15 DIAGNOSIS — M810 Age-related osteoporosis without current pathological fracture: Secondary | ICD-10-CM | POA: Diagnosis not present

## 2021-02-15 DIAGNOSIS — N1831 Chronic kidney disease, stage 3a: Secondary | ICD-10-CM | POA: Diagnosis not present

## 2021-02-15 DIAGNOSIS — I69354 Hemiplegia and hemiparesis following cerebral infarction affecting left non-dominant side: Secondary | ICD-10-CM | POA: Diagnosis not present

## 2021-02-15 DIAGNOSIS — E1122 Type 2 diabetes mellitus with diabetic chronic kidney disease: Secondary | ICD-10-CM | POA: Diagnosis not present

## 2021-02-15 DIAGNOSIS — K219 Gastro-esophageal reflux disease without esophagitis: Secondary | ICD-10-CM | POA: Diagnosis not present

## 2021-02-15 DIAGNOSIS — I1310 Hypertensive heart and chronic kidney disease without heart failure, with stage 1 through stage 4 chronic kidney disease, or unspecified chronic kidney disease: Secondary | ICD-10-CM | POA: Diagnosis not present

## 2021-02-15 DIAGNOSIS — F0280 Dementia in other diseases classified elsewhere without behavioral disturbance: Secondary | ICD-10-CM | POA: Diagnosis not present

## 2021-02-15 DIAGNOSIS — E559 Vitamin D deficiency, unspecified: Secondary | ICD-10-CM | POA: Diagnosis not present

## 2021-02-21 DIAGNOSIS — G309 Alzheimer's disease, unspecified: Secondary | ICD-10-CM | POA: Diagnosis not present

## 2021-02-21 DIAGNOSIS — F0280 Dementia in other diseases classified elsewhere without behavioral disturbance: Secondary | ICD-10-CM | POA: Diagnosis not present

## 2021-02-21 DIAGNOSIS — F339 Major depressive disorder, recurrent, unspecified: Secondary | ICD-10-CM | POA: Diagnosis not present

## 2021-02-25 DIAGNOSIS — G309 Alzheimer's disease, unspecified: Secondary | ICD-10-CM | POA: Diagnosis not present

## 2021-02-25 DIAGNOSIS — F39 Unspecified mood [affective] disorder: Secondary | ICD-10-CM | POA: Diagnosis not present

## 2021-03-21 DIAGNOSIS — G309 Alzheimer's disease, unspecified: Secondary | ICD-10-CM | POA: Diagnosis not present

## 2021-03-21 DIAGNOSIS — F419 Anxiety disorder, unspecified: Secondary | ICD-10-CM | POA: Diagnosis not present

## 2021-03-21 DIAGNOSIS — F339 Major depressive disorder, recurrent, unspecified: Secondary | ICD-10-CM | POA: Diagnosis not present

## 2021-03-21 DIAGNOSIS — F0280 Dementia in other diseases classified elsewhere without behavioral disturbance: Secondary | ICD-10-CM | POA: Diagnosis not present

## 2021-03-22 DIAGNOSIS — F0280 Dementia in other diseases classified elsewhere without behavioral disturbance: Secondary | ICD-10-CM | POA: Diagnosis not present

## 2021-03-22 DIAGNOSIS — I69354 Hemiplegia and hemiparesis following cerebral infarction affecting left non-dominant side: Secondary | ICD-10-CM | POA: Diagnosis not present

## 2021-03-22 DIAGNOSIS — K219 Gastro-esophageal reflux disease without esophagitis: Secondary | ICD-10-CM | POA: Diagnosis not present

## 2021-03-22 DIAGNOSIS — F339 Major depressive disorder, recurrent, unspecified: Secondary | ICD-10-CM | POA: Diagnosis not present

## 2021-03-22 DIAGNOSIS — M810 Age-related osteoporosis without current pathological fracture: Secondary | ICD-10-CM | POA: Diagnosis not present

## 2021-03-22 DIAGNOSIS — E1122 Type 2 diabetes mellitus with diabetic chronic kidney disease: Secondary | ICD-10-CM | POA: Diagnosis not present

## 2021-03-22 DIAGNOSIS — N1831 Chronic kidney disease, stage 3a: Secondary | ICD-10-CM | POA: Diagnosis not present

## 2021-03-22 DIAGNOSIS — E559 Vitamin D deficiency, unspecified: Secondary | ICD-10-CM | POA: Diagnosis not present

## 2021-03-22 DIAGNOSIS — G301 Alzheimer's disease with late onset: Secondary | ICD-10-CM | POA: Diagnosis not present

## 2021-03-22 DIAGNOSIS — I1310 Hypertensive heart and chronic kidney disease without heart failure, with stage 1 through stage 4 chronic kidney disease, or unspecified chronic kidney disease: Secondary | ICD-10-CM | POA: Diagnosis not present

## 2021-03-27 DIAGNOSIS — F419 Anxiety disorder, unspecified: Secondary | ICD-10-CM | POA: Diagnosis not present

## 2021-03-27 DIAGNOSIS — F339 Major depressive disorder, recurrent, unspecified: Secondary | ICD-10-CM | POA: Diagnosis not present

## 2021-03-28 DIAGNOSIS — Z23 Encounter for immunization: Secondary | ICD-10-CM | POA: Diagnosis not present

## 2021-04-19 DIAGNOSIS — K029 Dental caries, unspecified: Secondary | ICD-10-CM | POA: Diagnosis not present

## 2021-04-19 DIAGNOSIS — G301 Alzheimer's disease with late onset: Secondary | ICD-10-CM | POA: Diagnosis not present

## 2021-04-19 DIAGNOSIS — N1831 Chronic kidney disease, stage 3a: Secondary | ICD-10-CM | POA: Diagnosis not present

## 2021-04-19 DIAGNOSIS — I1310 Hypertensive heart and chronic kidney disease without heart failure, with stage 1 through stage 4 chronic kidney disease, or unspecified chronic kidney disease: Secondary | ICD-10-CM | POA: Diagnosis not present

## 2021-04-19 DIAGNOSIS — B372 Candidiasis of skin and nail: Secondary | ICD-10-CM | POA: Diagnosis not present

## 2021-04-19 DIAGNOSIS — I10 Essential (primary) hypertension: Secondary | ICD-10-CM | POA: Diagnosis not present

## 2021-04-19 DIAGNOSIS — M810 Age-related osteoporosis without current pathological fracture: Secondary | ICD-10-CM | POA: Diagnosis not present

## 2021-04-19 DIAGNOSIS — K219 Gastro-esophageal reflux disease without esophagitis: Secondary | ICD-10-CM | POA: Diagnosis not present

## 2021-04-19 DIAGNOSIS — F0280 Dementia in other diseases classified elsewhere without behavioral disturbance: Secondary | ICD-10-CM | POA: Diagnosis not present

## 2021-04-19 DIAGNOSIS — K5901 Slow transit constipation: Secondary | ICD-10-CM | POA: Diagnosis not present

## 2021-04-19 DIAGNOSIS — E1122 Type 2 diabetes mellitus with diabetic chronic kidney disease: Secondary | ICD-10-CM | POA: Diagnosis not present

## 2021-04-20 DIAGNOSIS — G301 Alzheimer's disease with late onset: Secondary | ICD-10-CM | POA: Diagnosis not present

## 2021-04-20 DIAGNOSIS — F02B18 Dementia in other diseases classified elsewhere, moderate, with other behavioral disturbance: Secondary | ICD-10-CM | POA: Diagnosis not present

## 2021-04-20 DIAGNOSIS — F339 Major depressive disorder, recurrent, unspecified: Secondary | ICD-10-CM | POA: Diagnosis not present

## 2021-05-09 DIAGNOSIS — E1151 Type 2 diabetes mellitus with diabetic peripheral angiopathy without gangrene: Secondary | ICD-10-CM | POA: Diagnosis not present

## 2021-05-09 DIAGNOSIS — L84 Corns and callosities: Secondary | ICD-10-CM | POA: Diagnosis not present

## 2021-05-10 DIAGNOSIS — N1831 Chronic kidney disease, stage 3a: Secondary | ICD-10-CM | POA: Diagnosis not present

## 2021-05-10 DIAGNOSIS — I10 Essential (primary) hypertension: Secondary | ICD-10-CM | POA: Diagnosis not present

## 2021-05-10 DIAGNOSIS — G301 Alzheimer's disease with late onset: Secondary | ICD-10-CM | POA: Diagnosis not present

## 2021-05-10 DIAGNOSIS — F339 Major depressive disorder, recurrent, unspecified: Secondary | ICD-10-CM | POA: Diagnosis not present

## 2021-05-10 DIAGNOSIS — F0280 Dementia in other diseases classified elsewhere without behavioral disturbance: Secondary | ICD-10-CM | POA: Diagnosis not present

## 2021-05-10 DIAGNOSIS — E1122 Type 2 diabetes mellitus with diabetic chronic kidney disease: Secondary | ICD-10-CM | POA: Diagnosis not present

## 2021-05-10 DIAGNOSIS — K029 Dental caries, unspecified: Secondary | ICD-10-CM | POA: Diagnosis not present

## 2021-05-10 DIAGNOSIS — I1310 Hypertensive heart and chronic kidney disease without heart failure, with stage 1 through stage 4 chronic kidney disease, or unspecified chronic kidney disease: Secondary | ICD-10-CM | POA: Diagnosis not present

## 2021-05-10 DIAGNOSIS — I69354 Hemiplegia and hemiparesis following cerebral infarction affecting left non-dominant side: Secondary | ICD-10-CM | POA: Diagnosis not present

## 2021-05-10 DIAGNOSIS — B372 Candidiasis of skin and nail: Secondary | ICD-10-CM | POA: Diagnosis not present

## 2021-05-12 DIAGNOSIS — F339 Major depressive disorder, recurrent, unspecified: Secondary | ICD-10-CM | POA: Diagnosis not present

## 2021-05-30 DIAGNOSIS — F339 Major depressive disorder, recurrent, unspecified: Secondary | ICD-10-CM | POA: Diagnosis not present

## 2021-05-30 DIAGNOSIS — F0280 Dementia in other diseases classified elsewhere without behavioral disturbance: Secondary | ICD-10-CM | POA: Diagnosis not present

## 2021-05-30 DIAGNOSIS — G301 Alzheimer's disease with late onset: Secondary | ICD-10-CM | POA: Diagnosis not present

## 2021-06-12 DIAGNOSIS — R059 Cough, unspecified: Secondary | ICD-10-CM | POA: Diagnosis not present

## 2021-06-22 DIAGNOSIS — N1831 Chronic kidney disease, stage 3a: Secondary | ICD-10-CM | POA: Diagnosis not present

## 2021-06-22 DIAGNOSIS — B372 Candidiasis of skin and nail: Secondary | ICD-10-CM | POA: Diagnosis not present

## 2021-06-22 DIAGNOSIS — I10 Essential (primary) hypertension: Secondary | ICD-10-CM | POA: Diagnosis not present

## 2021-06-22 DIAGNOSIS — I1310 Hypertensive heart and chronic kidney disease without heart failure, with stage 1 through stage 4 chronic kidney disease, or unspecified chronic kidney disease: Secondary | ICD-10-CM | POA: Diagnosis not present

## 2021-06-22 DIAGNOSIS — K029 Dental caries, unspecified: Secondary | ICD-10-CM | POA: Diagnosis not present

## 2021-06-22 DIAGNOSIS — Z8616 Personal history of COVID-19: Secondary | ICD-10-CM | POA: Diagnosis not present

## 2021-06-22 DIAGNOSIS — M545 Low back pain, unspecified: Secondary | ICD-10-CM | POA: Diagnosis not present

## 2021-06-22 DIAGNOSIS — E1122 Type 2 diabetes mellitus with diabetic chronic kidney disease: Secondary | ICD-10-CM | POA: Diagnosis not present

## 2021-06-22 DIAGNOSIS — F4321 Adjustment disorder with depressed mood: Secondary | ICD-10-CM | POA: Diagnosis not present

## 2021-06-22 DIAGNOSIS — E559 Vitamin D deficiency, unspecified: Secondary | ICD-10-CM | POA: Diagnosis not present

## 2021-06-29 DIAGNOSIS — F339 Major depressive disorder, recurrent, unspecified: Secondary | ICD-10-CM | POA: Diagnosis not present

## 2021-06-29 DIAGNOSIS — F0280 Dementia in other diseases classified elsewhere without behavioral disturbance: Secondary | ICD-10-CM | POA: Diagnosis not present

## 2021-06-29 DIAGNOSIS — G301 Alzheimer's disease with late onset: Secondary | ICD-10-CM | POA: Diagnosis not present

## 2021-07-17 DIAGNOSIS — F339 Major depressive disorder, recurrent, unspecified: Secondary | ICD-10-CM | POA: Diagnosis not present

## 2021-07-18 ENCOUNTER — Observation Stay (HOSPITAL_COMMUNITY)
Admission: EM | Admit: 2021-07-18 | Discharge: 2021-07-20 | Disposition: A | Discharge status: Skilled Nursing Facility | Payer: Medicare Other | Attending: Internal Medicine | Admitting: Internal Medicine

## 2021-07-18 ENCOUNTER — Encounter (HOSPITAL_COMMUNITY): Payer: Self-pay

## 2021-07-18 ENCOUNTER — Other Ambulatory Visit: Payer: Self-pay

## 2021-07-18 ENCOUNTER — Emergency Department (HOSPITAL_COMMUNITY): Payer: Medicare Other

## 2021-07-18 DIAGNOSIS — Z79899 Other long term (current) drug therapy: Secondary | ICD-10-CM | POA: Diagnosis not present

## 2021-07-18 DIAGNOSIS — I639 Cerebral infarction, unspecified: Secondary | ICD-10-CM | POA: Diagnosis present

## 2021-07-18 DIAGNOSIS — Z7982 Long term (current) use of aspirin: Secondary | ICD-10-CM | POA: Insufficient documentation

## 2021-07-18 DIAGNOSIS — G309 Alzheimer's disease, unspecified: Secondary | ICD-10-CM | POA: Diagnosis not present

## 2021-07-18 DIAGNOSIS — G459 Transient cerebral ischemic attack, unspecified: Principal | ICD-10-CM | POA: Diagnosis present

## 2021-07-18 DIAGNOSIS — Z743 Need for continuous supervision: Secondary | ICD-10-CM | POA: Diagnosis not present

## 2021-07-18 DIAGNOSIS — G4489 Other headache syndrome: Secondary | ICD-10-CM | POA: Diagnosis not present

## 2021-07-18 DIAGNOSIS — I10 Essential (primary) hypertension: Secondary | ICD-10-CM

## 2021-07-18 DIAGNOSIS — Z7984 Long term (current) use of oral hypoglycemic drugs: Secondary | ICD-10-CM | POA: Insufficient documentation

## 2021-07-18 DIAGNOSIS — Z20822 Contact with and (suspected) exposure to covid-19: Secondary | ICD-10-CM | POA: Insufficient documentation

## 2021-07-18 DIAGNOSIS — Z8673 Personal history of transient ischemic attack (TIA), and cerebral infarction without residual deficits: Secondary | ICD-10-CM | POA: Insufficient documentation

## 2021-07-18 DIAGNOSIS — N390 Urinary tract infection, site not specified: Secondary | ICD-10-CM | POA: Diagnosis not present

## 2021-07-18 DIAGNOSIS — F0280 Dementia in other diseases classified elsewhere without behavioral disturbance: Secondary | ICD-10-CM

## 2021-07-18 DIAGNOSIS — I6381 Other cerebral infarction due to occlusion or stenosis of small artery: Secondary | ICD-10-CM | POA: Diagnosis not present

## 2021-07-18 DIAGNOSIS — E1165 Type 2 diabetes mellitus with hyperglycemia: Secondary | ICD-10-CM | POA: Diagnosis not present

## 2021-07-18 DIAGNOSIS — R531 Weakness: Secondary | ICD-10-CM | POA: Diagnosis not present

## 2021-07-18 DIAGNOSIS — Z7902 Long term (current) use of antithrombotics/antiplatelets: Secondary | ICD-10-CM | POA: Diagnosis not present

## 2021-07-18 DIAGNOSIS — R2681 Unsteadiness on feet: Secondary | ICD-10-CM | POA: Diagnosis present

## 2021-07-18 LAB — ETHANOL: Alcohol, Ethyl (B): <10 mg/dL (ref <10)

## 2021-07-18 LAB — CBC
HCT: 35.3 % — ABNORMAL LOW (ref 36.0–46.0)
Hemoglobin: 11.5 g/dL — ABNORMAL LOW (ref 12.0–15.0)
MCH: 28.7 pg (ref 26.0–34.0)
MCHC: 32.6 g/dL (ref 30.0–36.0)
MCV: 88 fL (ref 80.0–100.0)
Platelets: 221 10*3/uL (ref 150–400)
RBC: 4.01 MIL/uL (ref 3.87–5.11)
RDW: 13.7 % (ref 11.5–15.5)
WBC: 7.1 10*3/uL (ref 4.0–10.5)
nRBC: 0 % (ref 0.0–0.2)

## 2021-07-18 LAB — DIFFERENTIAL
Abs Immature Granulocytes: 0.02 10*3/uL (ref 0.00–0.07)
Basophils Absolute: 0.1 10*3/uL (ref 0.0–0.1)
Basophils Relative: 1 %
Eosinophils Absolute: 0.3 10*3/uL (ref 0.0–0.5)
Eosinophils Relative: 4 %
Immature Granulocytes: 0 %
Lymphocytes Relative: 22 %
Lymphs Abs: 1.6 10*3/uL (ref 0.7–4.0)
Monocytes Absolute: 0.6 10*3/uL (ref 0.1–1.0)
Monocytes Relative: 8 %
Neutro Abs: 4.6 10*3/uL (ref 1.7–7.7)
Neutrophils Relative %: 65 %

## 2021-07-18 LAB — COMPREHENSIVE METABOLIC PANEL
ALT: 18 U/L (ref 0–44)
AST: 20 U/L (ref 15–41)
Albumin: 4 g/dL (ref 3.5–5.0)
Alkaline Phosphatase: 68 U/L (ref 38–126)
Anion gap: 9 (ref 5–15)
BUN: 25 mg/dL — ABNORMAL HIGH (ref 8–23)
CO2: 25 mmol/L (ref 22–32)
Calcium: 9.5 mg/dL (ref 8.9–10.3)
Chloride: 104 mmol/L (ref 98–111)
Creatinine, Ser: 1.12 mg/dL — ABNORMAL HIGH (ref 0.44–1.00)
GFR, Estimated: 46 mL/min — ABNORMAL LOW (ref >60)
Glucose, Bld: 160 mg/dL — ABNORMAL HIGH (ref 70–99)
Potassium: 4.1 mmol/L (ref 3.5–5.1)
Sodium: 138 mmol/L (ref 135–145)
Total Bilirubin: 0.4 mg/dL (ref 0.3–1.2)
Total Protein: 7.1 g/dL (ref 6.5–8.1)

## 2021-07-18 LAB — I-STAT CHEM 8, ED
BUN: 24 mg/dL — ABNORMAL HIGH (ref 8–23)
Calcium, Ion: 1.27 mmol/L (ref 1.15–1.40)
Chloride: 104 mmol/L (ref 98–111)
Creatinine, Ser: 1.2 mg/dL — ABNORMAL HIGH (ref 0.44–1.00)
Glucose, Bld: 156 mg/dL — ABNORMAL HIGH (ref 70–99)
HCT: 34 % — ABNORMAL LOW (ref 36.0–46.0)
Hemoglobin: 11.6 g/dL — ABNORMAL LOW (ref 12.0–15.0)
Potassium: 4.1 mmol/L (ref 3.5–5.1)
Sodium: 140 mmol/L (ref 135–145)
TCO2: 26 mmol/L (ref 22–32)

## 2021-07-18 LAB — APTT: aPTT: 27 seconds (ref 24–36)

## 2021-07-18 LAB — PROTIME-INR
INR: 0.9 (ref 0.8–1.2)
Prothrombin Time: 12.5 seconds (ref 11.4–15.2)

## 2021-07-18 LAB — CBG MONITORING, ED: Glucose-Capillary: 187 mg/dL — ABNORMAL HIGH (ref 70–99)

## 2021-07-18 IMAGING — CT CT HEAD CODE STROKE
3 series · 14 of 47 positions shown, 16 images · non-contrast
Comparison: Prior CT from [DATE].

CLINICAL DATA: Code stroke.



[Series 2: head w o · axial · 0.47mm/px · z∈[-6,+124]mm · 8 of 32 slices shown, 10 images]
[im 3/32  brain]
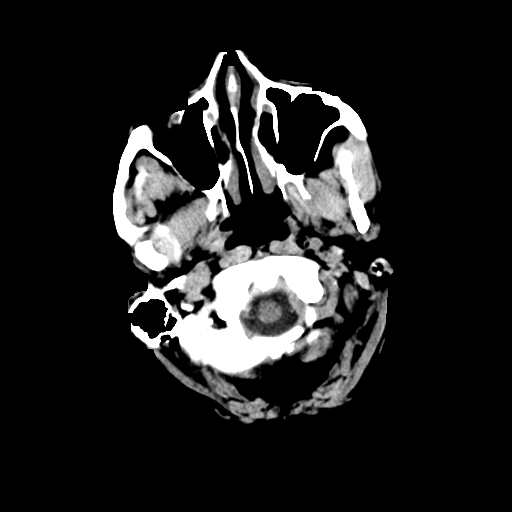
[im 3/32  bone]
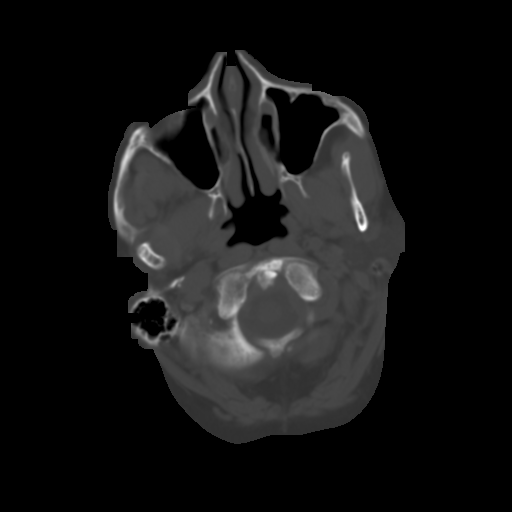
[im 7/32  brain]
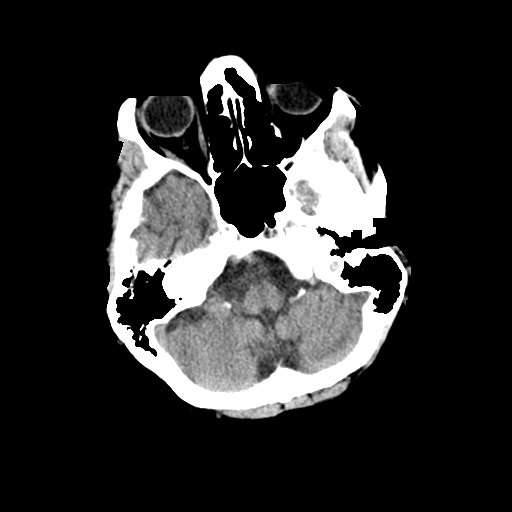
[im 10/32  brain]
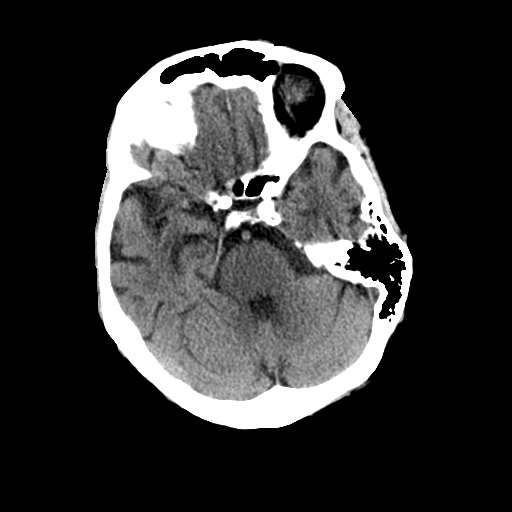
[im 14/32  brain]
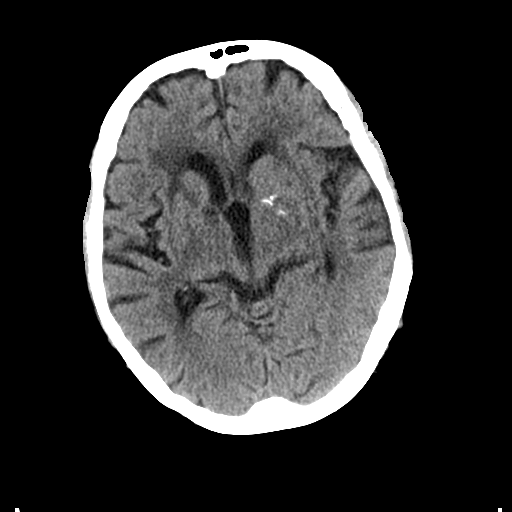
[im 18/32  brain]
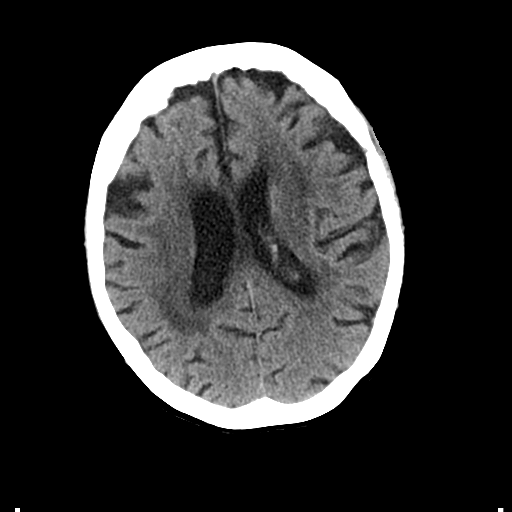
[im 18/32  bone]
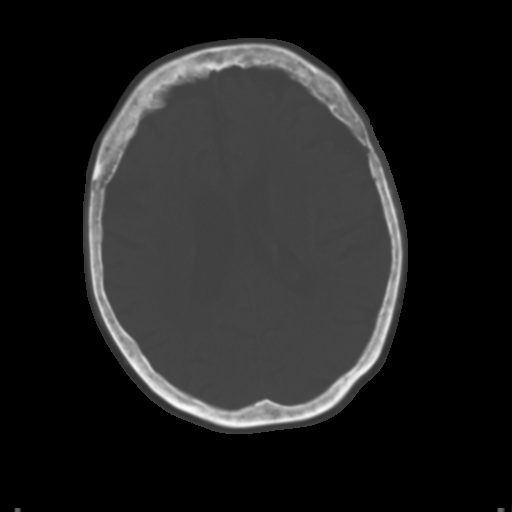
[im 22/32  brain]
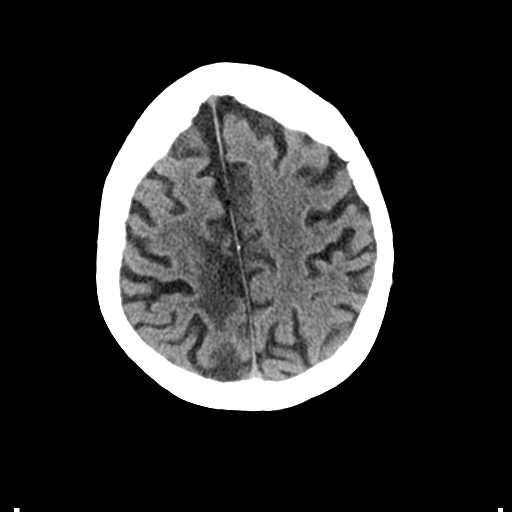
[im 25/32  brain]
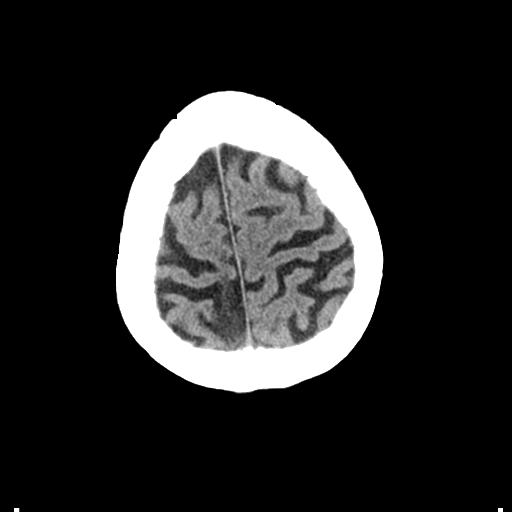
[im 29/32  brain]
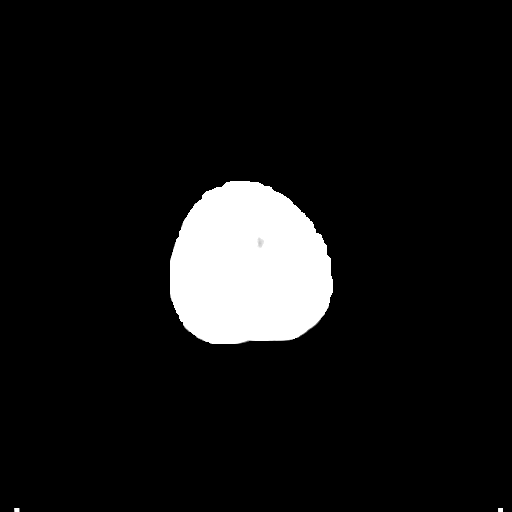

[Series 4: coronal soft · coronal · 0.32mm/px · 3 of 67 slices shown]
[im 23/67  brain]
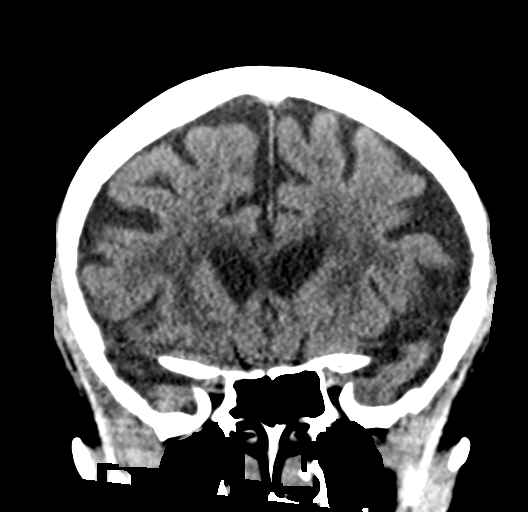
[im 30/67  brain]
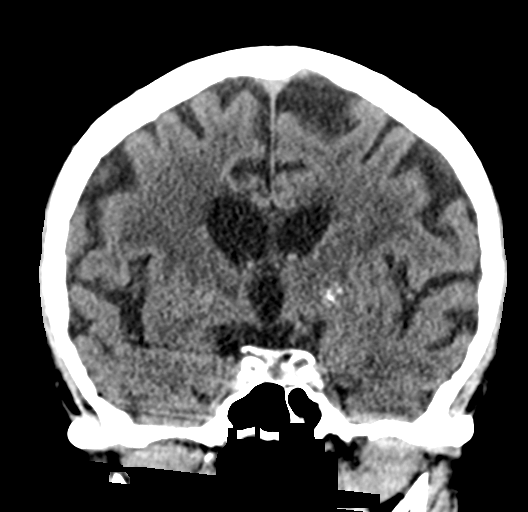
[im 37/67  brain]
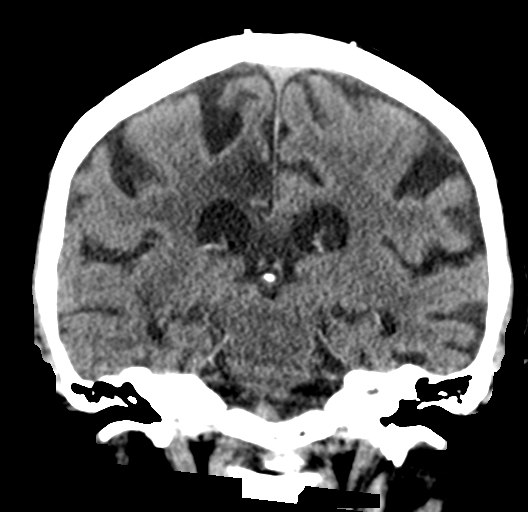

[Series 5: sagittal soft · sagittal · 0.30mm/px · 3 of 54 slices shown]
[im 18/54  brain]
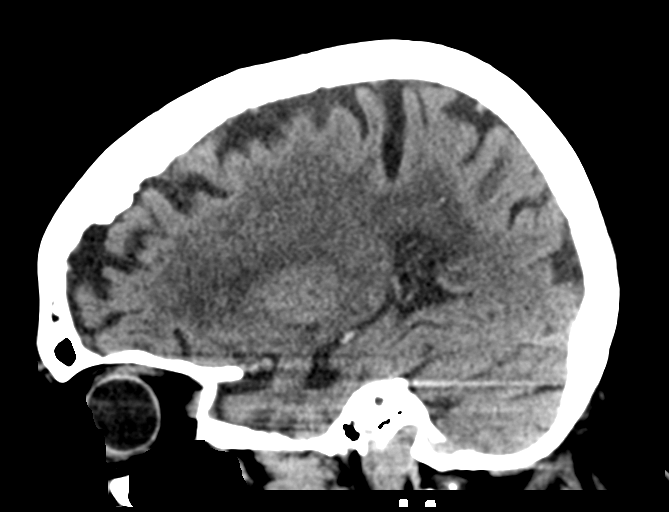
[im 27/54  brain]
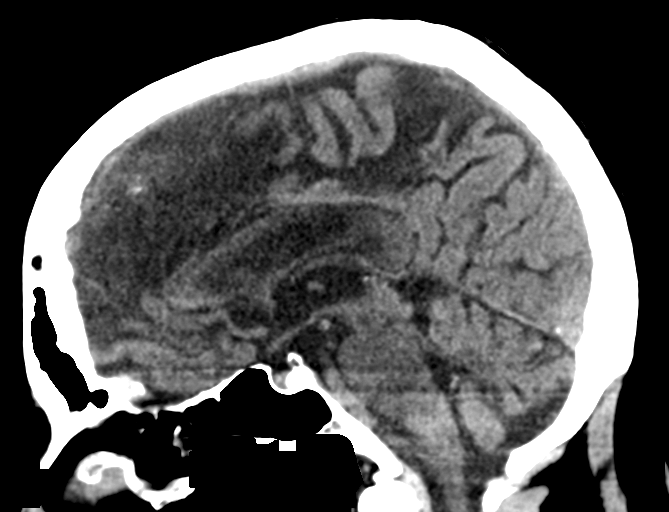
[im 36/54  brain]
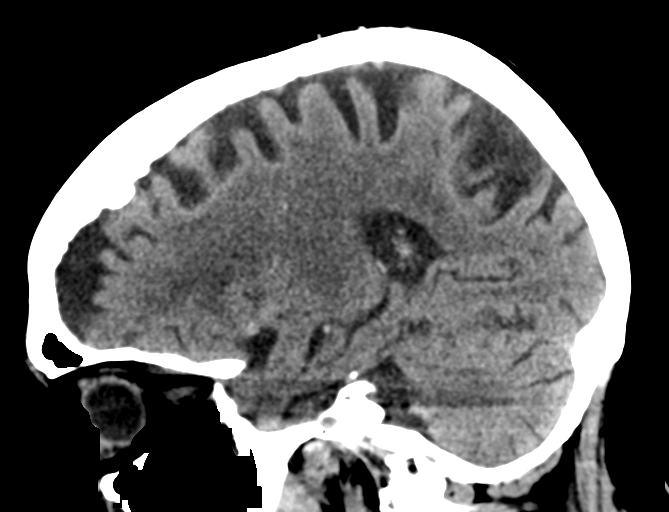

[14 of 47 positions shown; findings below may reference images not displayed]

FINDINGS: Brain: Diffuse prominence of the CSF containing spaces compatible
generalized cerebral atrophy. Patchy and confluent hypodensity
involving the supratentorial cerebral white matter most consistent
with chronic small vessel ischemic disease, moderate in nature. Few
scatter remote lacunar infarcts present about the deep gray nuclei.

No acute intracranial hemorrhage. Hypodensity involving the
parasagittal right frontoparietal region adjacent to the falx
consistent with a right ACA territory ischemic infarct, age
indeterminate. Upon review of prior CT from [DATE], there is
suggestion of a small acute ischemic infarct at this location on
prior head CT, although the area involved on today's exam appears
larger, and a superimposed acute component would be difficult to
exclude. No other visible large vessel territory infarct. No mass
lesion, mass effect, or midline shift. No hydrocephalus or
extra-axial fluid collection.

Vascular: No visible hyperdense vessel. Calcified atherosclerosis
present at the skull base.

Skull: Scalp soft tissues and calvarium within normal limits.

Sinuses/Orbits: Globes and orbital soft tissues demonstrate no acute
finding. Visualized paranasal sinuses and mastoid air cells are
clear.

Other: None.

ASPECTS (Alberta Stroke Program Early CT Score)

- Ganglionic level infarction (caudate, lentiform nuclei, internal
capsule, insula, M1-M3 cortex): 7

- Supraganglionic infarction (M4-M6 cortex): 3

Total score (0-10 with 10 being normal): 10
IMPRESSION: 1. Age-indeterminate posterior right ACA territory infarct involving
the parasagittal right frontoparietal region. While this could be
chronic in nature, a possible acute to subacute component is
difficult to exclude, and could be considered in the correct
clinical setting. No intracranial hemorrhage.
2. ASPECTS is 10.
3. Underlying age-related cerebral atrophy with moderate chronic
microvascular ischemic disease.

Critical Value/emergent results were called by telephone at the time
of interpretation on [DATE] at [DATE] to provider DAIC ,
who verbally acknowledged these results.

## 2021-07-18 MED ORDER — ASPIRIN 325 MG PO TABS
325.0000 mg | ORAL_TABLET | Freq: Once | ORAL | Status: DC
  Filled 2021-07-18: qty 1

## 2021-07-18 NOTE — ED Provider Notes (Signed)
[facility] EMERGENCY DEPARTMENT Provider Note   CSN: [id number] Arrival date & time: 07/18/21  2228     History  Chief Complaint  Patient presents with   Code Stroke    Julia [name] is a 88 y.o. female.  HPI Patient presents from skilled nursing facility for concern of stroke.  She was last seen normal at 7:30 PM.  Per EMS report, patient has dementia at baseline but is conversational.  She does have gait unsteadiness at baseline but is able to ambulate.  Staff at facility noticed that she seemed altered.  Patient also seems to have new left-sided motor deficits.  Per chart review, medical history is notable for Alzheimer's disease, HTN, and possible prior CVA. Her medications include the following: Zyrtec, lisinopril, metformin, Zoloft, Protonix, amlodipine, ASA, glipizide.    Home Medications Prior to Admission medications   Medication Sig Start Date End Date Taking? Authorizing Provider  acetaminophen (TYLENOL) 500 MG tablet Take 500 mg by mouth 2 (two) times daily.   Yes [provider]  amLODipine (NORVASC) 2.5 MG tablet Take 2.5 mg by mouth daily. 06/29/21  Yes [provider]  cetirizine (ZYRTEC) 10 MG tablet Take 10 mg by mouth daily.   Yes [provider]  glipiZIDE (GLUCOTROL) 5 MG tablet Take 5 mg by mouth daily. 06/29/21  Yes [provider]  lisinopril (ZESTRIL) 40 MG tablet Take 40 mg by mouth daily. 06/29/21  Yes [provider]  loperamide (IMODIUM A-D) 2 MG tablet Take 2 mg by mouth 4 (four) times daily as needed for diarrhea or loose stools.   Yes [provider]  LORazepam (ATIVAN) 0.5 MG tablet Take 0.5 mg by mouth daily as needed for anxiety. 05/24/21  Yes [provider]  metFORMIN (GLUCOPHAGE) 500 MG tablet Take 500 mg by mouth 2 (two) times daily. 07/03/21  Yes [provider]  [facility] powder Apply 1 application topically in the morning and at bedtime. 06/20/21  Yes [provider]   ondansetron (ZOFRAN-ODT) 4 MG disintegrating tablet Take 1 tablet by mouth every 8 (eight) hours as needed for nausea or vomiting. 07/25/20  Yes [provider]  pantoprazole (PROTONIX) 20 MG tablet Take 20 mg by mouth daily. 06/29/21  Yes [provider]  sertraline (ZOLOFT) 50 MG tablet Take 50 mg by mouth daily. 06/29/21  Yes [provider]  TUMS 500 MG chewable tablet Chew 2 tablets by mouth 3 (three) times daily as needed for heartburn or indigestion. 06/23/21  Yes [provider]  Vitamin D, Ergocalciferol, (DRISDOL) 1.25 MG (50000 UNIT) CAPS capsule Take 50,000 Units by mouth once a week. Fridays 06/29/21  Yes [provider]  aspirin EC 81 MG tablet Take 1 tablet (81 mg total) by mouth daily. Swallow whole. Discontinue after 08/10/21 07/20/21   Kathie Dike, MD  clopidogrel (PLAVIX) 75 MG tablet Take 1 tablet (75 mg total) by mouth daily. 07/21/21   Kathie Dike, MD  nitrofurantoin, macrocrystal-monohydrate, (MACROBID) 100 MG capsule Take 1 capsule (100 mg total) by mouth every 12 (twelve) hours. 07/20/21   Kathie Dike, MD  pravastatin (PRAVACHOL) 20 MG tablet Take 1 tablet (20 mg total) by mouth daily at 6 PM. 07/20/21   Kathie Dike, MD      Allergies    Sulfa antibiotics    Review of Systems   Review of Systems  Unable to perform ROS: Dementia   Physical Exam Updated Vital Signs BP 131/87 (BP Location: Right Arm)    Pulse  79    Temp 99.1 F (37.3 C)    Resp 20    Ht 5\' 6"  (1.676 m)    Wt 74.8 kg Comment: pt. was not able to stand nurse was award of it and also the different in the weight I mention to   SpO2 97%    BMI 26.62 kg/m  Physical Exam Constitutional:      General: She is not in acute distress.    Appearance: Normal appearance. She is normal weight. She is not ill-appearing, toxic-appearing or diaphoretic.  HENT:     Head: Normocephalic and atraumatic.     Right Ear: External ear normal.     Left Ear: External ear  normal.     Nose: Nose normal.     Mouth/Throat:     Mouth: Mucous membranes are moist.     Pharynx: Oropharynx is clear.  Eyes:     General: No scleral icterus. Cardiovascular:     Rate and Rhythm: Normal rate and regular rhythm.  Pulmonary:     Effort: Pulmonary effort is normal. No respiratory distress.     Breath sounds: No wheezing.  Chest:     Chest wall: No tenderness.  Abdominal:     Palpations: Abdomen is soft.     Tenderness: There is no abdominal tenderness. There is no guarding.  Musculoskeletal:     Cervical back: Neck supple. No rigidity.     Right lower leg: No edema.     Left lower leg: No edema.  Skin:    General: Skin is warm and dry.     Coloration: Skin is not jaundiced or pale.  Neurological:     Mental Status: She is alert. She is disoriented.     GCS: GCS eye subscore is 4. GCS verbal subscore is 4. GCS motor subscore is 6.     Cranial Nerves: No facial asymmetry.     Motor: Weakness (Left hemibody) present.     Comments: Complete exam limited by patient's confusion.  She does appear to have left hemibody weakness and neglect.  Psychiatric:        Mood and Affect: Mood normal.        Behavior: Behavior normal.    ED Results / Procedures / Treatments   Labs (all labs ordered are listed, but only abnormal results are displayed) Labs Reviewed  CBC - Abnormal; Notable for the following components:      Result Value   Hemoglobin 11.5 (*)    HCT 35.3 (*)    All other components within normal limits  COMPREHENSIVE METABOLIC PANEL - Abnormal; Notable for the following components:   Glucose, Bld 160 (*)    BUN 25 (*)    Creatinine, Ser 1.12 (*)    GFR, Estimated 46 (*)    All other components within normal limits  HEMOGLOBIN A1C - Abnormal; Notable for the following components:   Hgb A1c MFr Bld 5.7 (*)    All other components within normal limits  LIPID PANEL - Abnormal; Notable for the following components:   HDL 26 (*)    All other components  within normal limits  CBC - Abnormal; Notable for the following components:   RBC 3.46 (*)    Hemoglobin 9.6 (*)    HCT 30.6 (*)    All other components within normal limits  COMPREHENSIVE METABOLIC PANEL - Abnormal; Notable for the following components:   Glucose, Bld 139 (*)    BUN 24 (*)  Creatinine, Ser 1.09 (*)    Total Protein 5.7 (*)    Albumin 3.2 (*)    GFR, Estimated 48 (*)    All other components within normal limits  GLUCOSE, CAPILLARY - Abnormal; Notable for the following components:   Glucose-Capillary 115 (*)    All other components within normal limits  URINALYSIS, COMPLETE (UACMP) WITH MICROSCOPIC - Abnormal; Notable for the following components:   Specific Gravity, Urine >1.030 (*)    Nitrite POSITIVE (*)    Leukocytes,Ua SMALL (*)    Bacteria, UA MANY (*)    All other components within normal limits  GLUCOSE, CAPILLARY - Abnormal; Notable for the following components:   Glucose-Capillary 122 (*)    All other components within normal limits  GLUCOSE, CAPILLARY - Abnormal; Notable for the following components:   Glucose-Capillary 123 (*)    All other components within normal limits  GLUCOSE, CAPILLARY - Abnormal; Notable for the following components:   Glucose-Capillary 118 (*)    All other components within normal limits  I-STAT CHEM 8, ED - Abnormal; Notable for the following components:   BUN 24 (*)    Creatinine, Ser 1.20 (*)    Glucose, Bld 156 (*)    Hemoglobin 11.6 (*)    HCT 34.0 (*)    All other components within normal limits  CBG MONITORING, ED - Abnormal; Notable for the following components:   Glucose-Capillary 187 (*)    All other components within normal limits  RESP PANEL BY RT-PCR (FLU A&B, COVID) ARPGX2  URINE CULTURE  ETHANOL  PROTIME-INR  APTT  DIFFERENTIAL  MAGNESIUM  PHOSPHORUS  GLUCOSE, CAPILLARY  GLUCOSE, CAPILLARY    EKG EKG Interpretation  Date/Time:  Tuesday July 18 2021 22:43:50 EST Ventricular Rate:  77 PR  Interval:  142 QRS Duration: 119 QT Interval:  388 QTC Calculation: 440 R Axis:   70 Text Interpretation: Sinus rhythm Ventricular premature complex Incomplete left bundle branch block Low voltage, precordial leads No previous tracing Confirmed by Orpah Greek [id number]) on 07/19/2021 12:06:55 AM  Radiology MR ANGIO HEAD WO CONTRAST  Result Date: 07/19/2021 CLINICAL DATA:  Weakness and confusion for 3 days, stroke suspected EXAM: MRI HEAD WITHOUT CONTRAST MRA HEAD WITHOUT CONTRAST MRA NECK WITHOUT AND WITH CONTRAST TECHNIQUE: Multiplanar, multi-echo pulse sequences of the brain and surrounding structures were acquired without intravenous contrast. Angiographic images of the Circle of Willis were acquired using MRA technique without intravenous contrast. Angiographic images of the neck were acquired using MRA technique without and with intravenous contrast. Carotid stenosis measurements (when applicable) are obtained utilizing NASCET criteria, using the distal internal carotid diameter as the denominator. CONTRAST:  9mL GADAVIST GADOBUTROL 1 MMOL/ML IV SOLN COMPARISON:  MRI head 08/15/2018, correlation is also made with CT head 07/18/2021. No prior MRA FINDINGS: MRI HEAD FINDINGS Brain: No restricted diffusion to suggest acute or subacute infarct. No acute hemorrhage, mass, mass effect, or midline shift. Area of susceptibility in the right frontal lobe may be related to a prior infarct. Focus of susceptibility in the left basal ganglia and thalamus, likely related to prior hypertensive microhemorrhages. No hydrocephalus or extra-axial collection. Confluent T2 hyperintense signal in the periventricular white matter and pons, likely the sequela of moderate to severe chronic small vessel ischemic disease, which has progressed from the prior exam. Encephalomalacia in the medial right parietal lobe (series 13, image 38 and series 12, image 28), likely related to remote parietal cortical infarct, although  this appears new compared to 2020. Vascular:  Please see MRA findings below. Skull and upper cervical spine: Normal marrow signal. Sinuses/Orbits: No acute or significant finding. Status post bilateral lens replacements. Other: The mastoids are well aerated. MRA HEAD FINDINGS Anterior circulation: Both internal carotid arteries are patent to the termini, with mild irregularity but without significant stenosis. Hypoplastic right A1 with severe focal stenosis distally (series 1, image 101). Mild narrowing in a patent left A1. Normal anterior communicating artery. Multifocal narrowing in the left ACA, with severe stenosis in the A2 segment (series 1, image 109), and complete non opacification of the more distal left ACA (series 1, images 160-166) . Mild narrowing in the proximal right A2 (series 1, image 107), with significantly diminished opacification of the distal right ACA (series 1, image 163) and mild multifocal narrowing in the distal ACA branches (series 1, image 163). Mild narrowing in the distal left M1 (series 100, image 102) and in the proximal inferior left M2 (series 1, image 99). Poor perfusion of additional branches of the inferior left M2, with intermittent opacification. Distal left MCA branches are irregular but otherwise perfused. Irregularity without focal stenosis in the right M1. Multifocal narrowing in the M2 and more distal right MCA branches, with focal poor opacification of some M3 branches (for example series 1, images 105-116). Posterior circulation: The left V4 segment and left PICA origin are patent. The right V4 is poorly opacified proximally and not opacified distally, with some retrograde flow in the most distal aspect of the right V4. A prominent right AICA is visualized. Basilar somewhat irregular but patent to its distal aspect. Superior cerebellar arteries patent proximally. Bilateral P1 segments originate from the basilar artery. The left posterior communicating artery is  visualized, although there appears to be significant stenosis in the midportion of the vessel, with poor signal. PCAs with multifocal irregularity, as well as focal stenosis in the left distal P2 (series 1, image 84) and proximal P3 segment (series 1, images 92-96). Poor visualization of the P3s bilaterally. Anatomic variants: None significant MRA NECK FINDINGS Aortic arch: Three-vessel arch without significant stenosis. No dissection or aneurysm. Right carotid system: Mild narrowing at the bifurcation, without hemodynamically significant stenosis. No evidence of occlusion or aneurysm. Left carotid system: Mild narrowing at the bifurcation without hemodynamically significant stenosis. No evidence of occlusion or aneurysm. Vertebral arteries: Normal left vertebral artery, which is patent from its origin to the vertebrobasilar junction, without significant stenosis, occlusion, or aneurysm. Multifocal narrowing of the right V1, which appears patent, with intermittent non opacification of the right V2 (for example series 17, images 46-48). Intermittent opacification is also noted in the V3 and V4 segments. Other: None IMPRESSION: 1. No acute infarct. Sequela of chronic small vessel disease, with a remote right parietal cortical infarct that is new compared to 2020. 2. Intermittent non opacification of the right V2 and V3 segments, with non opacification of the majority of the right V4 segment, likely secondary to multifocal atherosclerotic narrowing. No other hemodynamically significant stenosis in the neck. 3. No other intracranial proximal large vessel occlusion, however there is multifocal intracranial atherosclerotic narrowing, with focal non opacification of the distal ACAs. Additional severe stenosis or poor perfusion is noted in the distal right A1, proximal left A2, left M2, distal left P2, bilateral distal MCAs and PCAs, and left posterior communicating artery. Electronically Signed   By: Merilyn Baba M.D.    On: 07/19/2021 13:27   MR ANGIO NECK W WO CONTRAST  Result Date: 07/19/2021 CLINICAL DATA:  Weakness and confusion for  3 days, stroke suspected EXAM: MRI HEAD WITHOUT CONTRAST MRA HEAD WITHOUT CONTRAST MRA NECK WITHOUT AND WITH CONTRAST TECHNIQUE: Multiplanar, multi-echo pulse sequences of the brain and surrounding structures were acquired without intravenous contrast. Angiographic images of the Circle of Willis were acquired using MRA technique without intravenous contrast. Angiographic images of the neck were acquired using MRA technique without and with intravenous contrast. Carotid stenosis measurements (when applicable) are obtained utilizing NASCET criteria, using the distal internal carotid diameter as the denominator. CONTRAST:  30mL GADAVIST GADOBUTROL 1 MMOL/ML IV SOLN COMPARISON:  MRI head 08/15/2018, correlation is also made with CT head 07/18/2021. No prior MRA FINDINGS: MRI HEAD FINDINGS Brain: No restricted diffusion to suggest acute or subacute infarct. No acute hemorrhage, mass, mass effect, or midline shift. Area of susceptibility in the right frontal lobe may be related to a prior infarct. Focus of susceptibility in the left basal ganglia and thalamus, likely related to prior hypertensive microhemorrhages. No hydrocephalus or extra-axial collection. Confluent T2 hyperintense signal in the periventricular white matter and pons, likely the sequela of moderate to severe chronic small vessel ischemic disease, which has progressed from the prior exam. Encephalomalacia in the medial right parietal lobe (series 13, image 38 and series 12, image 28), likely related to remote parietal cortical infarct, although this appears new compared to 2020. Vascular: Please see MRA findings below. Skull and upper cervical spine: Normal marrow signal. Sinuses/Orbits: No acute or significant finding. Status post bilateral lens replacements. Other: The mastoids are well aerated. MRA HEAD FINDINGS Anterior circulation:  Both internal carotid arteries are patent to the termini, with mild irregularity but without significant stenosis. Hypoplastic right A1 with severe focal stenosis distally (series 1, image 101). Mild narrowing in a patent left A1. Normal anterior communicating artery. Multifocal narrowing in the left ACA, with severe stenosis in the A2 segment (series 1, image 109), and complete non opacification of the more distal left ACA (series 1, images 160-166) . Mild narrowing in the proximal right A2 (series 1, image 107), with significantly diminished opacification of the distal right ACA (series 1, image 163) and mild multifocal narrowing in the distal ACA branches (series 1, image 163). Mild narrowing in the distal left M1 (series 100, image 102) and in the proximal inferior left M2 (series 1, image 99). Poor perfusion of additional branches of the inferior left M2, with intermittent opacification. Distal left MCA branches are irregular but otherwise perfused. Irregularity without focal stenosis in the right M1. Multifocal narrowing in the M2 and more distal right MCA branches, with focal poor opacification of some M3 branches (for example series 1, images 105-116). Posterior circulation: The left V4 segment and left PICA origin are patent. The right V4 is poorly opacified proximally and not opacified distally, with some retrograde flow in the most distal aspect of the right V4. A prominent right AICA is visualized. Basilar somewhat irregular but patent to its distal aspect. Superior cerebellar arteries patent proximally. Bilateral P1 segments originate from the basilar artery. The left posterior communicating artery is visualized, although there appears to be significant stenosis in the midportion of the vessel, with poor signal. PCAs with multifocal irregularity, as well as focal stenosis in the left distal P2 (series 1, image 84) and proximal P3 segment (series 1, images 92-96). Poor visualization of the P3s  bilaterally. Anatomic variants: None significant MRA NECK FINDINGS Aortic arch: Three-vessel arch without significant stenosis. No dissection or aneurysm. Right carotid system: Mild narrowing at the bifurcation, without hemodynamically significant stenosis.  No evidence of occlusion or aneurysm. Left carotid system: Mild narrowing at the bifurcation without hemodynamically significant stenosis. No evidence of occlusion or aneurysm. Vertebral arteries: Normal left vertebral artery, which is patent from its origin to the vertebrobasilar junction, without significant stenosis, occlusion, or aneurysm. Multifocal narrowing of the right V1, which appears patent, with intermittent non opacification of the right V2 (for example series 17, images 46-48). Intermittent opacification is also noted in the V3 and V4 segments. Other: None IMPRESSION: 1. No acute infarct. Sequela of chronic small vessel disease, with a remote right parietal cortical infarct that is new compared to 2020. 2. Intermittent non opacification of the right V2 and V3 segments, with non opacification of the majority of the right V4 segment, likely secondary to multifocal atherosclerotic narrowing. No other hemodynamically significant stenosis in the neck. 3. No other intracranial proximal large vessel occlusion, however there is multifocal intracranial atherosclerotic narrowing, with focal non opacification of the distal ACAs. Additional severe stenosis or poor perfusion is noted in the distal right A1, proximal left A2, left M2, distal left P2, bilateral distal MCAs and PCAs, and left posterior communicating artery. Electronically Signed   By: Merilyn Baba M.D.   On: 07/19/2021 13:27   MR BRAIN WO CONTRAST  Result Date: 07/19/2021 CLINICAL DATA:  Weakness and confusion for 3 days, stroke suspected EXAM: MRI HEAD WITHOUT CONTRAST MRA HEAD WITHOUT CONTRAST MRA NECK WITHOUT AND WITH CONTRAST TECHNIQUE: Multiplanar, multi-echo pulse sequences of the brain  and surrounding structures were acquired without intravenous contrast. Angiographic images of the Circle of Willis were acquired using MRA technique without intravenous contrast. Angiographic images of the neck were acquired using MRA technique without and with intravenous contrast. Carotid stenosis measurements (when applicable) are obtained utilizing NASCET criteria, using the distal internal carotid diameter as the denominator. CONTRAST:  31mL GADAVIST GADOBUTROL 1 MMOL/ML IV SOLN COMPARISON:  MRI head 08/15/2018, correlation is also made with CT head 07/18/2021. No prior MRA FINDINGS: MRI HEAD FINDINGS Brain: No restricted diffusion to suggest acute or subacute infarct. No acute hemorrhage, mass, mass effect, or midline shift. Area of susceptibility in the right frontal lobe may be related to a prior infarct. Focus of susceptibility in the left basal ganglia and thalamus, likely related to prior hypertensive microhemorrhages. No hydrocephalus or extra-axial collection. Confluent T2 hyperintense signal in the periventricular white matter and pons, likely the sequela of moderate to severe chronic small vessel ischemic disease, which has progressed from the prior exam. Encephalomalacia in the medial right parietal lobe (series 13, image 38 and series 12, image 28), likely related to remote parietal cortical infarct, although this appears new compared to 2020. Vascular: Please see MRA findings below. Skull and upper cervical spine: Normal marrow signal. Sinuses/Orbits: No acute or significant finding. Status post bilateral lens replacements. Other: The mastoids are well aerated. MRA HEAD FINDINGS Anterior circulation: Both internal carotid arteries are patent to the termini, with mild irregularity but without significant stenosis. Hypoplastic right A1 with severe focal stenosis distally (series 1, image 101). Mild narrowing in a patent left A1. Normal anterior communicating artery. Multifocal narrowing in the left  ACA, with severe stenosis in the A2 segment (series 1, image 109), and complete non opacification of the more distal left ACA (series 1, images 160-166) . Mild narrowing in the proximal right A2 (series 1, image 107), with significantly diminished opacification of the distal right ACA (series 1, image 163) and mild multifocal narrowing in the distal ACA branches (series 1, image  163). Mild narrowing in the distal left M1 (series 100, image 102) and in the proximal inferior left M2 (series 1, image 99). Poor perfusion of additional branches of the inferior left M2, with intermittent opacification. Distal left MCA branches are irregular but otherwise perfused. Irregularity without focal stenosis in the right M1. Multifocal narrowing in the M2 and more distal right MCA branches, with focal poor opacification of some M3 branches (for example series 1, images 105-116). Posterior circulation: The left V4 segment and left PICA origin are patent. The right V4 is poorly opacified proximally and not opacified distally, with some retrograde flow in the most distal aspect of the right V4. A prominent right AICA is visualized. Basilar somewhat irregular but patent to its distal aspect. Superior cerebellar arteries patent proximally. Bilateral P1 segments originate from the basilar artery. The left posterior communicating artery is visualized, although there appears to be significant stenosis in the midportion of the vessel, with poor signal. PCAs with multifocal irregularity, as well as focal stenosis in the left distal P2 (series 1, image 84) and proximal P3 segment (series 1, images 92-96). Poor visualization of the P3s bilaterally. Anatomic variants: None significant MRA NECK FINDINGS Aortic arch: Three-vessel arch without significant stenosis. No dissection or aneurysm. Right carotid system: Mild narrowing at the bifurcation, without hemodynamically significant stenosis. No evidence of occlusion or aneurysm. Left carotid  system: Mild narrowing at the bifurcation without hemodynamically significant stenosis. No evidence of occlusion or aneurysm. Vertebral arteries: Normal left vertebral artery, which is patent from its origin to the vertebrobasilar junction, without significant stenosis, occlusion, or aneurysm. Multifocal narrowing of the right V1, which appears patent, with intermittent non opacification of the right V2 (for example series 17, images 46-48). Intermittent opacification is also noted in the V3 and V4 segments. Other: None IMPRESSION: 1. No acute infarct. Sequela of chronic small vessel disease, with a remote right parietal cortical infarct that is new compared to 2020. 2. Intermittent non opacification of the right V2 and V3 segments, with non opacification of the majority of the right V4 segment, likely secondary to multifocal atherosclerotic narrowing. No other hemodynamically significant stenosis in the neck. 3. No other intracranial proximal large vessel occlusion, however there is multifocal intracranial atherosclerotic narrowing, with focal non opacification of the distal ACAs. Additional severe stenosis or poor perfusion is noted in the distal right A1, proximal left A2, left M2, distal left P2, bilateral distal MCAs and PCAs, and left posterior communicating artery. Electronically Signed   By: Merilyn Baba M.D.   On: 07/19/2021 13:27   EEG adult  Result Date: 07/19/2021 Jaynie Bream, MD     07/19/2021  9:36 PM ROUTINE EEG REPORT DATE OF STUDY:  07/19/21 from 16:55 to 17:35 HISTORY: 88yo woman with prior stroke with residual left sided weakness with episode of left sided weakness and slurred speech. DESCRIPTION: During maximal wakefulness, the background was organized and continuous but mildly slow.  There was a posterior dominant rhythm seen up to 6 Hz. Spontaneous variability and reactivity to stimulation were present. Stage I (N1) sleep was recorded, with alpha dropout, slow roving eye movements,  high-voltage centrally predominant vertex waves, and positive occipital sharp transients of sleep (POSTS). Stage II (N2) sleep was recorded, with bilateral and symmetric K complexes, and sleep spindles at 12-16 Hz. There was a mild asymmetry with decreased amplitudes seen over the right frontal region. There were frequent very brief bursts of polymorphic delta slowing seen moreso over the right hemisphere between 1-5  seconds. No epileptiform discharges were recorded. No clinical or electrographic seizures were recorded.  Hyperventilation and photic stimulation were not performed. CLASSIFICATION (EEG IMPRESSION): Abnormal Significance II (awake, asleep) Intermittent slow, lateralized, right Asymmetry (mild), right frontal decreased Background slow CLINICAL INTERPRETATION: 1) There is evidence of moderate focal cerebral dysfunction located over the right frontal region which is in keeping with patient's history of prior infarct located there. 2) There is also evidence of a diffuse mild to moderate encephalopathy which is non-specific and could be related to underlying neurocognitive disorders, multiple prior infarcts, or sedating medications. 3) No seizures were seen on this recording.  A routine EEG does not exclude a diagnosis of epilepsy. Colby A. Marvel Plan, MD Neurology and Clinical Neurophysiology  ECHOCARDIOGRAM COMPLETE  Result Date: 07/19/2021    ECHOCARDIOGRAM REPORT   Patient Name:   [facility] Date of Exam: 07/19/2021 Medical Rec #:  [id number]   Height:       66.0 in Accession #:    [id number]  Weight:       164.9 lb Date of Birth:  01-16-1928   BSA:          1.842 m Patient Age:    75 years    BP:           155/58 mmHg Patient Gender: F           HR:           72 bpm. Exam Location:  [facility] Procedure: 2D Echo, Cardiac Doppler and Color Doppler Indications:    Stroke  History:        Patient has no prior history of Echocardiogram examinations.                 Stroke, Signs/Symptoms:Alzheimer's;  Risk Factors:Hypertension                 and Diabetes.  Sonographer:    Wenda Low Referring Phys: [id number] OLADAPO ADEFESO IMPRESSIONS  1. Left ventricular ejection fraction, by estimation, is 60 to 65%. The left ventricle has normal function. The left ventricle has no regional wall motion abnormalities. There is mild left ventricular hypertrophy. Left ventricular diastolic parameters are consistent with Grade I diastolic dysfunction (impaired relaxation).  2. Right ventricular systolic function is normal. The right ventricular size is normal. There is mildly elevated pulmonary artery systolic pressure. The estimated right ventricular systolic pressure is 14.4 mmHg.  3. The mitral valve is normal in structure. Trivial mitral valve regurgitation. No evidence of mitral stenosis.  4. The aortic valve is tricuspid. Aortic valve regurgitation is not visualized. No aortic stenosis is present.  5. The inferior vena cava is normal in size with greater than 50% respiratory variability, suggesting right atrial pressure of 3 mmHg. FINDINGS  Left Ventricle: Left ventricular ejection fraction, by estimation, is 60 to 65%. The left ventricle has normal function. The left ventricle has no regional wall motion abnormalities. The left ventricular internal cavity size was normal in size. There is  mild left ventricular hypertrophy. Left ventricular diastolic parameters are consistent with Grade I diastolic dysfunction (impaired relaxation). Right Ventricle: The right ventricular size is normal. No increase in right ventricular wall thickness. Right ventricular systolic function is normal. There is mildly elevated pulmonary artery systolic pressure. The tricuspid regurgitant velocity is 2.88  m/s, and with an assumed right atrial pressure of 3 mmHg, the estimated right ventricular systolic pressure is 31.5 mmHg. Left Atrium: Left atrial size was normal in size. Right Atrium: Right atrial  size was normal in size. Pericardium:  There is no evidence of pericardial effusion. Presence of epicardial fat layer. Mitral Valve: The mitral valve is normal in structure. Trivial mitral valve regurgitation. No evidence of mitral valve stenosis. MV peak gradient, 8.6 mmHg. The mean mitral valve gradient is 2.0 mmHg. Tricuspid Valve: The tricuspid valve is normal in structure. Tricuspid valve regurgitation is trivial. Aortic Valve: The aortic valve is tricuspid. Aortic valve regurgitation is not visualized. No aortic stenosis is present. Aortic valve mean gradient measures 4.0 mmHg. Aortic valve peak gradient measures 7.4 mmHg. Aortic valve area, by VTI measures 2.83 cm. Pulmonic Valve: The pulmonic valve was not well visualized. Pulmonic valve regurgitation is not visualized. Aorta: The aortic root is normal in size and structure. Venous: The inferior vena cava is normal in size with greater than 50% respiratory variability, suggesting right atrial pressure of 3 mmHg. IAS/Shunts: The interatrial septum was not well visualized.  LEFT VENTRICLE PLAX 2D LVIDd:         4.20 cm     Diastology LVIDs:         3.10 cm     LV e' medial:    5.55 cm/s LV PW:         1.20 cm     LV E/e' medial:  12.8 LV IVS:        1.30 cm     LV e' lateral:   5.00 cm/s LVOT diam:     2.00 cm     LV E/e' lateral: 14.2 LV SV:         83 LV SV Index:   45 LVOT Area:     3.14 cm  LV Volumes (MOD) LV vol d, MOD A2C: 45.8 ml LV vol d, MOD A4C: 47.6 ml LV vol s, MOD A2C: 22.2 ml LV vol s, MOD A4C: 16.3 ml LV SV MOD A2C:     23.6 ml LV SV MOD A4C:     47.6 ml LV SV MOD BP:      28.6 ml RIGHT VENTRICLE RV Basal diam:  3.00 cm RV Mid diam:    2.10 cm RV S prime:     18.60 cm/s TAPSE (M-mode): 2.3 cm LEFT ATRIUM             Index        RIGHT ATRIUM           Index LA diam:        3.90 cm 2.12 cm/m   RA Area:     15.40 cm LA Vol (A2C):   44.6 ml 24.21 ml/m  RA Volume:   36.30 ml  19.70 ml/m LA Vol (A4C):   38.4 ml 20.84 ml/m LA Biplane Vol: 41.8 ml 22.69 ml/m  AORTIC VALVE                     PULMONIC VALVE AV Area (Vmax):    2.84 cm     PV Vmax:       0.92 m/s AV Area (Vmean):   2.86 cm     PV Peak grad:  3.4 mmHg AV Area (VTI):     2.83 cm AV Vmax:           136.00 cm/s AV Vmean:          85.300 cm/s AV VTI:            0.294 m AV Peak Grad:      7.4 mmHg AV Mean  Grad:      4.0 mmHg LVOT Vmax:         123.00 cm/s LVOT Vmean:        77.700 cm/s LVOT VTI:          0.265 m LVOT/AV VTI ratio: 0.90  AORTA Ao Root diam: 2.90 cm MITRAL VALVE                TRICUSPID VALVE MV Area (PHT): 2.97 cm     TR Peak grad:   33.2 mmHg MV Area VTI:   2.80 cm     TR Vmax:        288.00 cm/s MV Peak grad:  8.6 mmHg MV Mean grad:  2.0 mmHg     SHUNTS MV Vmax:       1.47 m/s     Systemic VTI:  0.26 m MV Vmean:      65.1 cm/s    Systemic Diam: 2.00 cm MV Decel Time: 255 msec MV E velocity: 71.10 cm/s MV A velocity: 113.00 cm/s MV E/A ratio:  0.63 Oswaldo Milian MD Electronically signed by Oswaldo Milian MD Signature Date/Time: 07/19/2021/3:42:13 PM    Final    CT HEAD CODE STROKE WO CONTRAST  Result Date: 07/18/2021 CLINICAL DATA:  Code stroke. EXAM: CT HEAD WITHOUT CONTRAST TECHNIQUE: Contiguous axial images were obtained from the base of the skull through the vertex without intravenous contrast. RADIATION DOSE REDUCTION: This exam was performed according to the departmental dose-optimization program which includes automated exposure control, adjustment of the mA and/or kV according to patient size and/or use of iterative reconstruction technique. COMPARISON:  Prior CT from 11/13/2020. FINDINGS: Brain: Diffuse prominence of the CSF containing spaces compatible generalized cerebral atrophy. Patchy and confluent hypodensity involving the supratentorial cerebral white matter most consistent with chronic small vessel ischemic disease, moderate in nature. Few scatter remote lacunar infarcts present about the deep gray nuclei. No acute intracranial hemorrhage. Hypodensity involving the parasagittal  right frontoparietal region adjacent to the falx consistent with a right ACA territory ischemic infarct, age indeterminate. Upon review of prior CT from 11/13/2020, there is suggestion of a small acute ischemic infarct at this location on prior head CT, although the area involved on today's exam appears larger, and a superimposed acute component would be difficult to exclude. No other visible large vessel territory infarct. No mass lesion, mass effect, or midline shift. No hydrocephalus or extra-axial fluid collection. Vascular: No visible hyperdense vessel. Calcified atherosclerosis present at the skull base. Skull: Scalp soft tissues and calvarium within normal limits. Sinuses/Orbits: Globes and orbital soft tissues demonstrate no acute finding. Visualized paranasal sinuses and mastoid air cells are clear. Other: None. ASPECTS ([place] Stroke Program Early CT Score) - Ganglionic level infarction (caudate, lentiform nuclei, internal capsule, insula, M1-M3 cortex): 7 - Supraganglionic infarction (M4-M6 cortex): 3 Total score (0-10 with 10 being normal): 10 IMPRESSION: 1. Age-indeterminate posterior right ACA territory infarct involving the parasagittal right frontoparietal region. While this could be chronic in nature, a possible acute to subacute component is difficult to exclude, and could be considered in the correct clinical setting. No intracranial hemorrhage. 2. ASPECTS is 10. 3. Underlying age-related cerebral atrophy with moderate chronic microvascular ischemic disease. Critical Value/emergent results were called by telephone at the time of interpretation on 07/18/2021 at 10:51 pm to provider Godfrey Pick , who verbally acknowledged these results. Electronically Signed   By: Jeannine Boga M.D.   On: 07/18/2021 22:58    Procedures Procedures    Medications  Ordered in ED Medications  acetaminophen (TYLENOL) tablet 650 mg (has no administration in time range)    Or  acetaminophen (TYLENOL) 160  MG/5ML solution 650 mg (has no administration in time range)    Or  acetaminophen (TYLENOL) suppository 650 mg (has no administration in time range)  aspirin EC tablet 81 mg (81 mg Oral Given 07/20/21 0800)  insulin aspart (novoLOG) injection 0-9 Units (0 Units Subcutaneous Not Given 07/20/21 1211)  insulin aspart (novoLOG) injection 0-5 Units (0 Units Subcutaneous Not Given 07/20/21 0200)  clopidogrel (PLAVIX) tablet 75 mg (75 mg Oral Given 07/20/21 0800)  pravastatin (PRAVACHOL) tablet 20 mg (20 mg Oral Given 07/19/21 1818)  pantoprazole (PROTONIX) EC tablet 40 mg (40 mg Oral Given 07/20/21 0800)  sertraline (ZOLOFT) tablet 50 mg (50 mg Oral Given 07/20/21 0800)  Vitamin D (Ergocalciferol) (DRISDOL) capsule 50,000 Units (has no administration in time range)  nitrofurantoin (macrocrystal-monohydrate) (MACROBID) capsule 100 mg (100 mg Oral Given 07/20/21 0800)  gadobutrol (GADAVIST) 1 MMOL/ML injection 7 mL (7 mLs Intravenous Contrast Given 07/19/21 1024)    ED Course/ Medical Decision Making/ A&P                           Medical Decision Making Amount and/or Complexity of Data Reviewed Labs: ordered. Radiology: ordered.  Risk Decision regarding hospitalization.   This patient presents to the ED for concern of altered mental status and left-sided weakness, this involves an extensive number of treatment options, and is a complaint that carries with it a high risk of complications and morbidity.  The differential diagnosis includes CVA, TIA, hypoglycemia, acute infection and/or metabolic disturbances   Co morbidities that complicate the patient evaluation  Dementia, HTN, CVA   Additional history obtained:  Additional history obtained from EMS External records from outside source obtained and reviewed including EMR   Lab Tests:  I Ordered, and personally interpreted labs.  The pertinent results include: Results of lab work pending at time of signout   Imaging Studies ordered:  I  ordered imaging studies including CT head  I independently visualized and interpreted imaging which showed age-indeterminate right ACA ischemic infarct I agree with the radiologist interpretation   Cardiac Monitoring:  The patient was maintained on a cardiac monitor.  I personally viewed and interpreted the cardiac monitored which showed an underlying rhythm of: Sinus rhythm   Medicines ordered and prescription drug management:  I ordered medication including ASA for concern of acute stroke Reevaluation of the patient after these medicines showed that the patient stayed the same I have reviewed the patients home medicines and have made adjustments as needed   Consultations Obtained:  I requested consultation with the neurologist,  and discussed lab and imaging findings as well as pertinent plan - they recommend: 325 ASA load and admission to the hospital for further stroke work-up.  Neurologist was informed that MRI would not be available until the morning.  He is comfortable with admission to any plan for MRI study in the morning.   Problem List / ED Course:  88 year old female with history of dementia, presenting from skilled nursing facility for concern of acute stroke.  She was reportedly normal at 7:30 PM.  She was subsequently found to have left hemibody weakness and difficulty with speech.  Call 2 EMS was at approximately 10 PM.  On arrival, she does appear to have left hemibody weakness and neglect.  Code stroke was called.  Patient underwent noncontrasted  CT scan of head which showed age-indeterminate right ACA infarct, that is consistent with her symptoms.  She underwent evaluation by teleneurology who recommended admission to the hospital for further stroke work-up.  25 mg ASA load was given.  Patient was discussed with hospitalist who stated that she would require results of lab work prior to admission.  Care of patient was signed out to oncoming ED  provider.   Reevaluation:  After the interventions noted above, I reevaluated the patient and found that they have :stayed the same   Social Determinants of Health:  Chronic dementia with skilled nursing care needs.   Dispostion:  After consideration of the diagnostic results and the patients response to treatment, I feel that the patent would benefit from admission to hospital. ;        Final Clinical Impression(s) / ED Diagnoses Final diagnoses:  TIA (transient ischemic attack)    Rx / DC Orders ED Discharge Orders          Ordered    clopidogrel (PLAVIX) 75 MG tablet  Daily        07/20/21 1146    Ambulatory referral to Neurology        07/20/21 1013    aspirin EC 81 MG tablet  Daily        07/20/21 1146    nitrofurantoin, macrocrystal-monohydrate, (MACROBID) 100 MG capsule  Every 12 hours        07/20/21 1146    pravastatin (PRAVACHOL) 20 MG tablet  Daily-1800        07/20/21 1146    Increase activity slowly        07/20/21 1146    Diet - low sodium heart healthy        07/20/21 1146              Godfrey Pick, MD 07/20/21 1858

## 2021-07-18 NOTE — ED Notes (Signed)
LKW 1930, left sided-facial droop, unable to grip with left hand or move left foot. Unable to stand. CBG 131, hypertensive with EMS 160/98. EDP to assess on arrival. Hx of stroke.

## 2021-07-18 NOTE — ED Triage Notes (Signed)
Code stroke from EMS 2221 encode 2228 arrival 2228- edp at bedside and cleared for CT. 2229- button pushed for cart.    LKW was 730pm Staff noticed pt has left sided deficits at 9:30pm when they were getting her ready to bed.  Pt was unable to stand from the weakness on the left leg. No grip with left hand. Pt was unable to describe objects when normally she can.   Patient is a resident from [facility] Has yellow DNR at bedside Takes 81mg  aspirin daily

## 2021-07-18 NOTE — ED Notes (Signed)
Pt in CT at this time.

## 2021-07-18 NOTE — ED Notes (Signed)
Difficulty getting IV and labs

## 2021-07-18 NOTE — Consult Note (Signed)
[facility] TeleNeurology Consult Services   Patient Name:   Julia [name], Julia [name] Date of Birth:   1928/05/21 Identification Number:   MRN - [id number] Date of Service:   07/18/2021 22:34:06  Diagnosis:       I63.9 - Cerebrovascular accident (CVA), unspecified mechanism ([place])  Impression:      Julia [name] is a 88 year-old woman with a PMH of HTN, DM, Alzheimer's Disease, Stroke (residual left leg weakness), wheelchair bound who was BIBEMS for left sided weakness and slurred speech. tPA was not given as he rsymptoms have resolved.  Metrics: Last Known Well: 07/18/2021 19:30:00 [facility] Notification Time: 07/18/2021 22:34:05 Arrival Time: 07/18/2021 23:31:00 Stamp Time: 07/18/2021 22:34:06 Initial Response Time: 07/18/2021 22:36:00 Symptoms: Left sided weakness and slurred speech. NIHSS Start Assessment Time: 07/18/2021 23:04:07 Patient is not a candidate for Thrombolytic. Thrombolytic Medical Decision: 07/18/2021 23:06:58 Patient was not deemed candidate for Thrombolytic because of following reasons: Resolved symptoms (no residual disabling symptoms).  CT head showed no acute hemorrhage or acute core infarct. I personally Reviewed the CT Head and it Showed old right ACA infarct  ED Physician notified of diagnostic impression and management plan on 07/18/2021 23:20:28  Advanced Imaging: Advanced Imaging Not Completed because:  LVO is not suspected   Our recommendations are outlined below.  Recommendations:        Stroke/Telemetry Floor       Neuro Checks       Bedside Swallow Eval       DVT Prophylaxis       IV Fluids, Normal Saline       Head of Bed 30 Degrees       Euglycemia and Avoid Hyperthermia (PRN Acetaminophen)       Initiate or continue Aspirin 325 MG daily       Antihypertensives PRN if Blood pressure is greater than 220/120 or there is a concern for End organ damage/contraindications for permissive HTN. If blood pressure is greater than  220/120 give labetalol PO or IV or Vasotec IV with a goal of 15% reduction in BP during the first 24 hours.       - MRI Brain without contrast       - MRA Head and Neck Stroke Protocol       - TTE       - Labs: lipid panel; HgbA1c       - PT/OT/ST where applicable  Routine Consultation with [facility] for Follow up Care  Sign Out:       Discussed with Emergency Department Provider    ------------------------------------------------------------------------------  History of Present Illness: Patient is a 88 year old Female.  Patient was brought by EMS for symptoms of Left sided weakness and slurred speech. Julia [name] is a 88 year-old woman with a PMH of HTN, DM, Alzheimer's Disease, Stroke (residual left leg weakness), wheelchair bound who was BIBEMS for left sided weakness and slurred speech. As per ER staff she is coming from a nursing home. They report that she is normally able to speak and answer questions. This evening after dinner they went to check in on her and she was found to have left sided weakness (arm and leg) and slurred speech. Her weakness and speech have resolved.   Past Medical History:      Hypertension      Diabetes Mellitus      Stroke  Medications:  No Anticoagulant use  Antiplatelet use: Yes ASA Reviewed EMR for current medications  Allergies:  Reviewed Description: Sulfa  Social History: Drug Use: No  Family History:  There is no family history of premature cerebrovascular disease pertinent to this consultation  ROS : 14 Points Review of Systems was performed and was negative except mentioned in HPI.  Past Surgical History: There Is No Surgical History Contributory To Todays Visit  NIHSS may not be reliable due to: Residual deficits from previous stroke  Examination: BP(158/62), Pulse(79), Blood Glucose(186) 1A: Level of Consciousness - Alert; keenly responsive + 0 1B: Ask Month and Age - Could Not Answer Either Question Correctly  + 2 1C: Blink Eyes & Squeeze Hands - Performs Both Tasks + 0 2: Test Horizontal Extraocular Movements - Normal + 0 3: Test Visual Fields - No Visual Loss + 0 4: Test Facial Palsy (Use Grimace if Obtunded) - Normal symmetry + 0 5A: Test Left Arm Motor Drift - Drift, but doesn't hit bed + 1 5B: Test Right Arm Motor Drift - No Drift for 10 Seconds + 0 6A: Test Left Leg Motor Drift - No Effort Against Gravity + 3 6B: Test Right Leg Motor Drift - No Drift for 5 Seconds + 0 7: Test Limb Ataxia (FNF/Heel-Shin) - No Ataxia + 0 8: Test Sensation - Mild-Moderate Loss: Less Sharp/More Dull + 1 9: Test Language/Aphasia - Normal; No aphasia + 0 10: Test Dysarthria - Normal + 0 11: Test Extinction/Inattention - No abnormality + 0  NIHSS Score: 7   Pre-Morbid Modified Rankin Scale: 4 Points = Moderately severe disability; unable to walk and attend to bodily needs without assistance   Patient/Family was informed the Neurology Consult would occur via TeleHealth consult by way of interactive audio and video telecommunications and consented to receiving care in this manner.   Patient is being evaluated for possible acute neurologic impairment and high probability of imminent or life-threatening deterioration. I spent total of 29 minutes providing care to this patient, including time for face to face visit via telemedicine, review of medical records, imaging studies and discussion of findings with providers, the patient and/or family.   Dr Asa Saunas   [facility] [phone]   Case [id number]

## 2021-07-19 ENCOUNTER — Observation Stay (HOSPITAL_COMMUNITY)
Admit: 2021-07-19 | Discharge: 2021-07-19 | Disposition: A | Discharge status: Home / Self Care | Payer: Medicare Other | Attending: Neurology | Admitting: Neurology

## 2021-07-19 ENCOUNTER — Observation Stay (HOSPITAL_COMMUNITY): Payer: Medicare Other

## 2021-07-19 ENCOUNTER — Observation Stay (HOSPITAL_BASED_OUTPATIENT_CLINIC_OR_DEPARTMENT_OTHER): Payer: Medicare Other

## 2021-07-19 DIAGNOSIS — I6389 Other cerebral infarction: Secondary | ICD-10-CM | POA: Diagnosis not present

## 2021-07-19 DIAGNOSIS — I639 Cerebral infarction, unspecified: Secondary | ICD-10-CM

## 2021-07-19 DIAGNOSIS — G459 Transient cerebral ischemic attack, unspecified: Secondary | ICD-10-CM | POA: Diagnosis not present

## 2021-07-19 DIAGNOSIS — F0280 Dementia in other diseases classified elsewhere without behavioral disturbance: Secondary | ICD-10-CM | POA: Diagnosis not present

## 2021-07-19 DIAGNOSIS — R410 Disorientation, unspecified: Secondary | ICD-10-CM | POA: Diagnosis not present

## 2021-07-19 DIAGNOSIS — Z8673 Personal history of transient ischemic attack (TIA), and cerebral infarction without residual deficits: Secondary | ICD-10-CM

## 2021-07-19 DIAGNOSIS — G309 Alzheimer's disease, unspecified: Secondary | ICD-10-CM | POA: Diagnosis not present

## 2021-07-19 DIAGNOSIS — I10 Essential (primary) hypertension: Secondary | ICD-10-CM

## 2021-07-19 DIAGNOSIS — E1165 Type 2 diabetes mellitus with hyperglycemia: Secondary | ICD-10-CM | POA: Diagnosis not present

## 2021-07-19 DIAGNOSIS — R531 Weakness: Secondary | ICD-10-CM | POA: Diagnosis not present

## 2021-07-19 LAB — ECHOCARDIOGRAM COMPLETE
AR max vel: 2.84 cm2
AV Area VTI: 2.83 cm2
AV Area mean vel: 2.86 cm2
AV Mean grad: 4 mmHg
AV Peak grad: 7.4 mmHg
Ao pk vel: 1.36 m/s
Area-P 1/2: 2.97 cm2
Calc EF: 60 %
Height: 66 in
MV VTI: 2.8 cm2
S' Lateral: 3.1 cm
Single Plane A2C EF: 51.5 %
Single Plane A4C EF: 65.8 %
Weight: 2638.47 oz

## 2021-07-19 LAB — CBC
HCT: 30.6 % — ABNORMAL LOW (ref 36.0–46.0)
Hemoglobin: 9.6 g/dL — ABNORMAL LOW (ref 12.0–15.0)
MCH: 27.7 pg (ref 26.0–34.0)
MCHC: 31.4 g/dL (ref 30.0–36.0)
MCV: 88.4 fL (ref 80.0–100.0)
Platelets: 183 10*3/uL (ref 150–400)
RBC: 3.46 MIL/uL — ABNORMAL LOW (ref 3.87–5.11)
RDW: 13.7 % (ref 11.5–15.5)
WBC: 6.7 10*3/uL (ref 4.0–10.5)
nRBC: 0 % (ref 0.0–0.2)

## 2021-07-19 LAB — LIPID PANEL
Cholesterol: 144 mg/dL (ref 0–200)
HDL: 26 mg/dL — ABNORMAL LOW (ref >40)
LDL Cholesterol: 93 mg/dL (ref 0–99)
Total CHOL/HDL Ratio: 5.5 RATIO
Triglycerides: 124 mg/dL (ref <150)
VLDL: 25 mg/dL (ref 0–40)

## 2021-07-19 LAB — RESP PANEL BY RT-PCR (FLU A&B, COVID) ARPGX2
Influenza A by PCR: NEGATIVE
Influenza B by PCR: NEGATIVE
SARS Coronavirus 2 by RT PCR: NEGATIVE

## 2021-07-19 LAB — GLUCOSE, CAPILLARY
Glucose-Capillary: 115 mg/dL — ABNORMAL HIGH (ref 70–99)
Glucose-Capillary: 122 mg/dL — ABNORMAL HIGH (ref 70–99)
Glucose-Capillary: 97 mg/dL (ref 70–99)
Glucose-Capillary: 99 mg/dL (ref 70–99)

## 2021-07-19 LAB — COMPREHENSIVE METABOLIC PANEL
ALT: 15 U/L (ref 0–44)
AST: 17 U/L (ref 15–41)
Albumin: 3.2 g/dL — ABNORMAL LOW (ref 3.5–5.0)
Alkaline Phosphatase: 54 U/L (ref 38–126)
Anion gap: 9 (ref 5–15)
BUN: 24 mg/dL — ABNORMAL HIGH (ref 8–23)
CO2: 23 mmol/L (ref 22–32)
Calcium: 9 mg/dL (ref 8.9–10.3)
Chloride: 108 mmol/L (ref 98–111)
Creatinine, Ser: 1.09 mg/dL — ABNORMAL HIGH (ref 0.44–1.00)
GFR, Estimated: 48 mL/min — ABNORMAL LOW (ref >60)
Glucose, Bld: 139 mg/dL — ABNORMAL HIGH (ref 70–99)
Potassium: 4.1 mmol/L (ref 3.5–5.1)
Sodium: 140 mmol/L (ref 135–145)
Total Bilirubin: 0.4 mg/dL (ref 0.3–1.2)
Total Protein: 5.7 g/dL — ABNORMAL LOW (ref 6.5–8.1)

## 2021-07-19 LAB — URINALYSIS, COMPLETE (UACMP) WITH MICROSCOPIC
Bilirubin Urine: NEGATIVE
Glucose, UA: NEGATIVE mg/dL
Hgb urine dipstick: NEGATIVE
Ketones, ur: NEGATIVE mg/dL
Nitrite: POSITIVE — AB
Protein, ur: NEGATIVE mg/dL
RBC / HPF: NONE SEEN RBC/hpf (ref 0–5)
Specific Gravity, Urine: >1.03 — ABNORMAL HIGH (ref 1.005–1.030)
pH: 5.5 (ref 5.0–8.0)

## 2021-07-19 LAB — MAGNESIUM: Magnesium: 1.7 mg/dL (ref 1.7–2.4)

## 2021-07-19 LAB — HEMOGLOBIN A1C
Hgb A1c MFr Bld: 5.7 % — ABNORMAL HIGH (ref 4.8–5.6)
Mean Plasma Glucose: 116.89 mg/dL

## 2021-07-19 LAB — PHOSPHORUS: Phosphorus: 3.2 mg/dL (ref 2.5–4.6)

## 2021-07-19 IMAGING — MR MR HEAD W/O CM
11 of 12 series · 41 of 48 positions shown · IV contrast (gadavist)
Comparison: MRI head [DATE], correlation is also made with CT
head [DATE]. No prior MRA

CLINICAL DATA: Weakness and confusion for 3 days, stroke suspected

EXAM:
MRI HEAD WITHOUT CONTRAST
MRA HEAD WITHOUT CONTRAST
MRA NECK WITHOUT AND WITH CONTRAST
TECHNIQUE: Multiplanar, multi-echo pulse sequences of the brain and surrounding
structures were acquired without intravenous contrast. Angiographic
images of the Circle of Willis were acquired using MRA technique
without intravenous contrast. Angiographic images of the neck were
acquired using MRA technique without and with intravenous contrast.
Carotid stenosis measurements (when applicable) are obtained
utilizing NASCET criteria, using the distal internal carotid
diameter as the denominator.
CONTRAST:  7mL GADAVIST GADOBUTROL 1 MMOL/ML IV SOLN

[Series 5: DWI · axial · 4.0mm · 0.88mm/px · z∈[-46,+92]mm · 4 of 36 slices shown (1 of 6)]
[im 1/36]
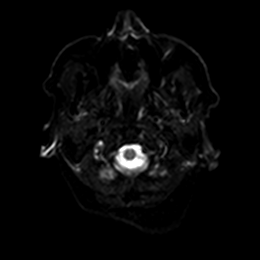
[im 12/36]
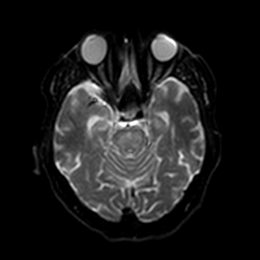
[im 24/36]
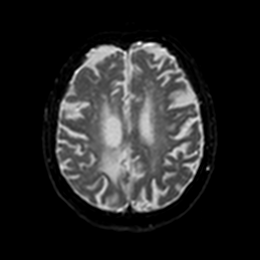
[im 36/36]
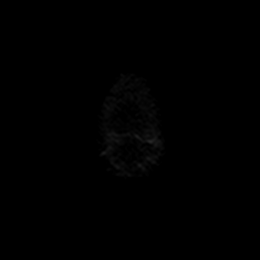

[Series 5: DWI · axial · 4.0mm · 0.88mm/px · z∈[-46,+92]mm · 4 of 36 slices shown (2 of 6)]
[im 1/36]
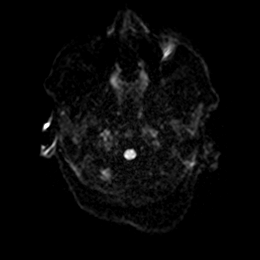
[im 12/36]
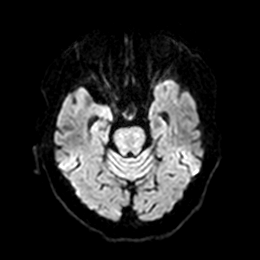
[im 24/36]
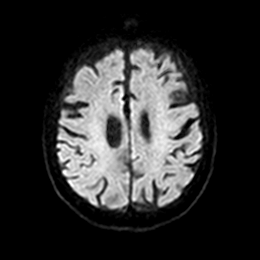
[im 36/36]
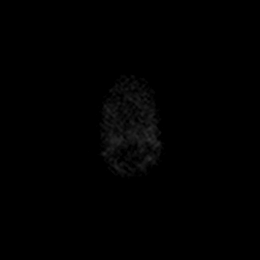

[Series 6: DWI · axial · 4.0mm · 0.88mm/px · z∈[-46,+92]mm · 4 of 36 slices shown (3 of 6)]
[im 1/36]
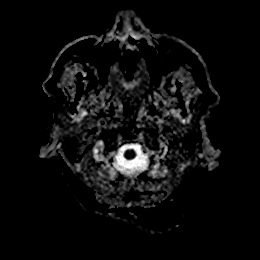
[im 12/36]
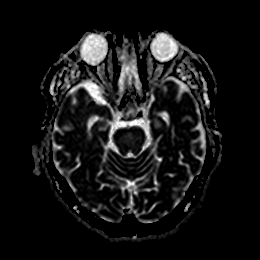
[im 24/36]
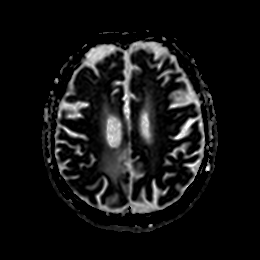
[im 36/36]
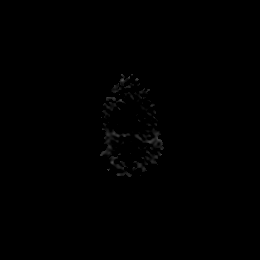

[Series 7: DWI · coronal · 5.0mm · 0.88mm/px · 4 of 28 slices shown (4 of 6)]
[im 1/28]
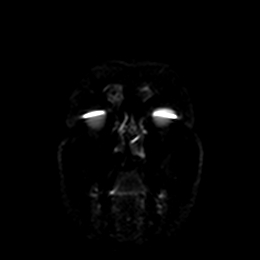
[im 10/28]
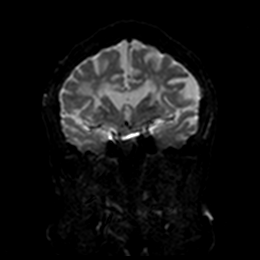
[im 19/28]
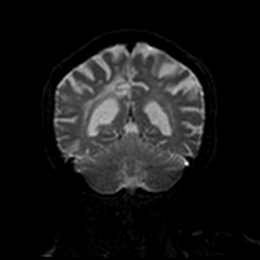
[im 28/28]
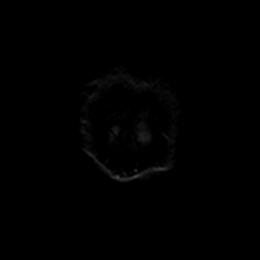

[Series 7: DWI · coronal · 5.0mm · 0.88mm/px · 4 of 28 slices shown (5 of 6)]
[im 1/28]
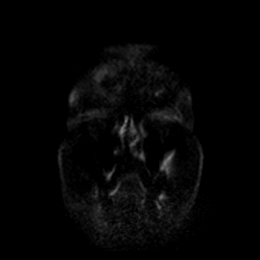
[im 10/28]
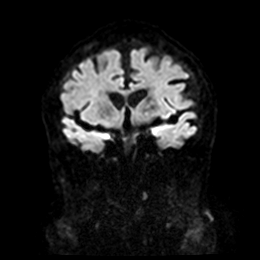
[im 19/28]
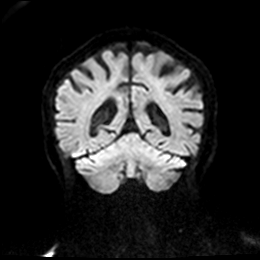
[im 28/28]
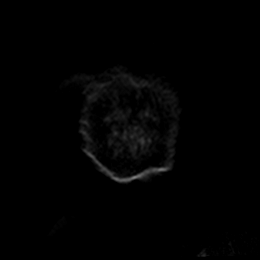

[Series 8: DWI · coronal · 5.0mm · 0.88mm/px · 4 of 28 slices shown (6 of 6)]
[im 1/28]
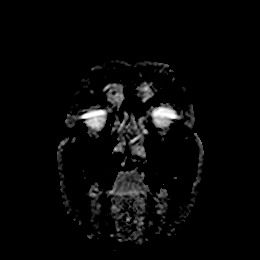
[im 10/28]
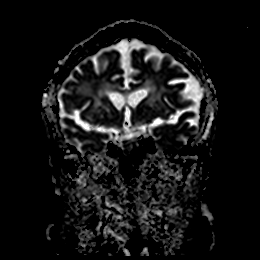
[im 19/28]
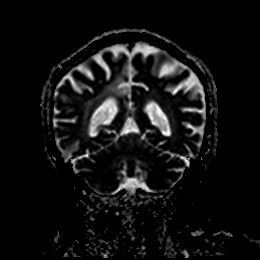
[im 28/28]
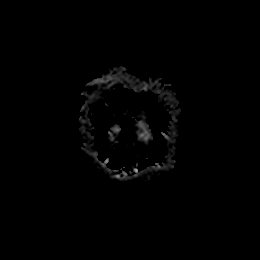

[Series 9: T1 · sagittal · 5.0mm · 0.94mm/px · 3 of 21 slices shown]
[im 1/21]
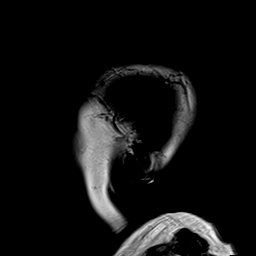
[im 11/21]
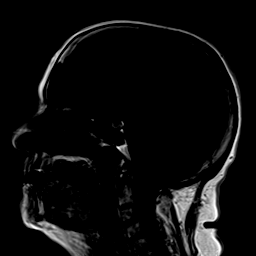
[im 21/21]
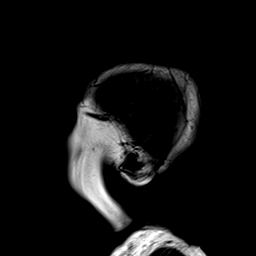

[Series 10: T2 · axial · 5.0mm · 0.72mm/px · z∈[-43,+88]mm · 3 of 20 slices shown (1 of 2)]
[im 1/20]
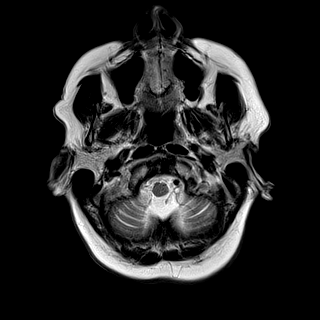
[im 10/20]
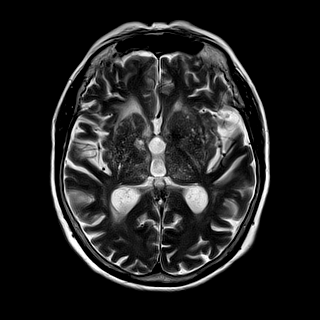
[im 20/20]
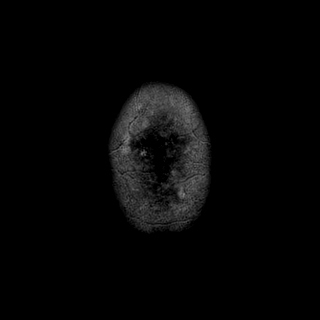

[Series 11: ax hemo · axial · 5.0mm · 0.86mm/px · z∈[-50,+21]mm · 2 of 25 slices shown]
[im 1/25]
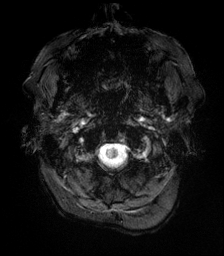
[im 13/25]
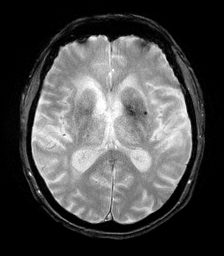

[Series 12: FLAIR · axial · 4.0mm · 0.43mm/px · z∈[-46,+88]mm · 5 of 35 slices shown]
[im 1/35]
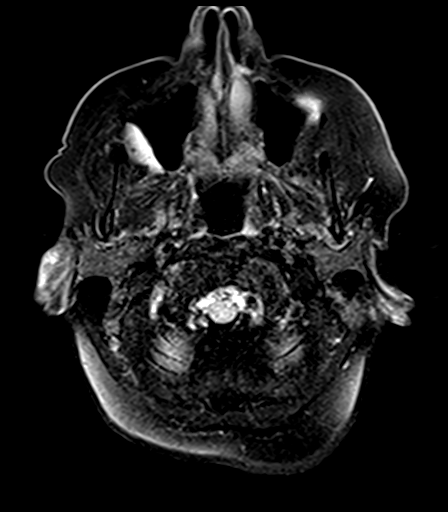
[im 9/35]
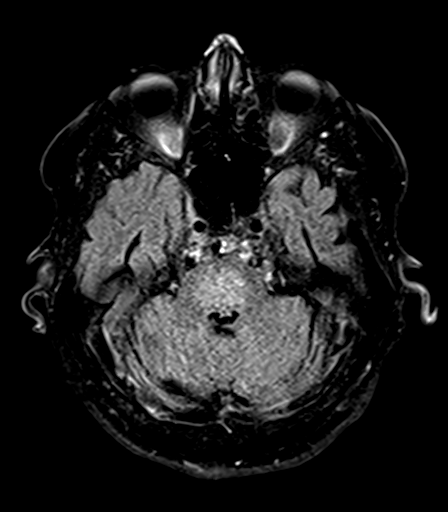
[im 18/35]
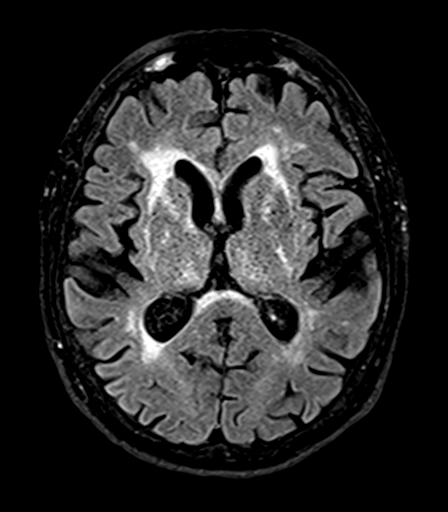
[im 26/35]
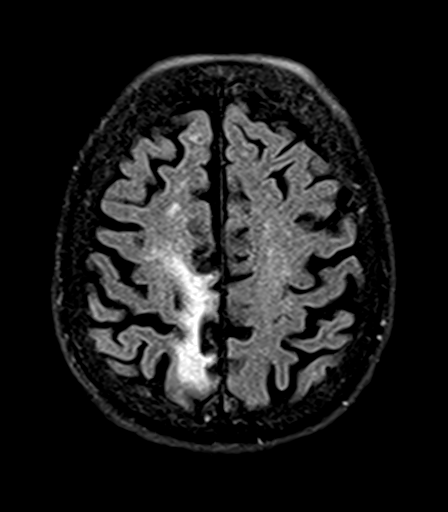
[im 35/35]
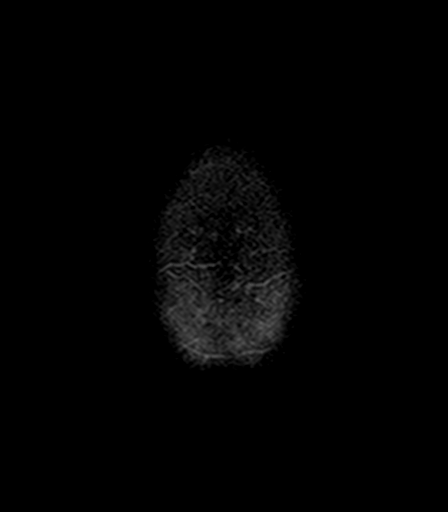

[Series 14: T2 · coronal · 5.0mm · 0.72mm/px · 4 of 27 slices shown (2 of 2)]
[im 1/27]
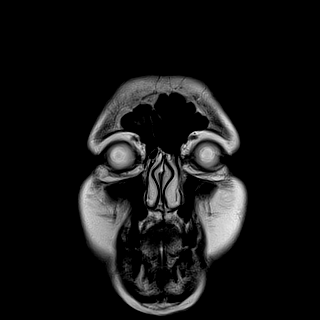
[im 9/27]
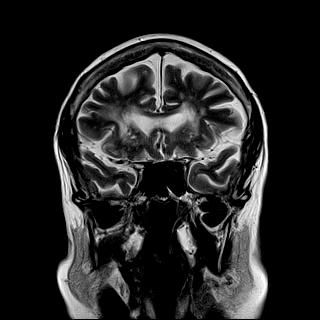
[im 18/27]
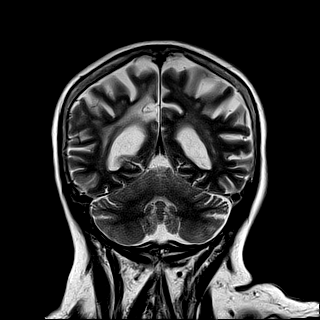
[im 27/27]
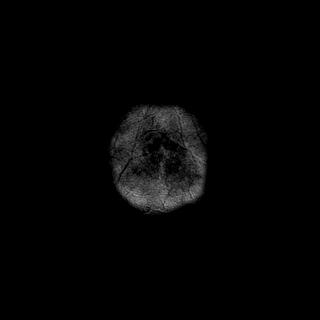

[41 of 48 positions shown; findings below may reference images not displayed]

FINDINGS: MRI HEAD FINDINGS

Brain: No restricted diffusion to suggest acute or subacute infarct.
No acute hemorrhage, mass, mass effect, or midline shift. Area of
susceptibility in the right frontal lobe may be related to a prior
infarct. Focus of susceptibility in the left basal ganglia and
thalamus, likely related to prior hypertensive microhemorrhages. No
hydrocephalus or extra-axial collection. Confluent T2 hyperintense
signal in the periventricular white matter and pons, likely the
sequela of moderate to severe chronic small vessel ischemic disease,
which has progressed from the prior exam. Encephalomalacia in the
medial right parietal lobe (series 13, image 38 and series 12, image
28), likely related to remote parietal cortical infarct, although
this appears new compared to [90].

Vascular: Please see MRA findings below.

Skull and upper cervical spine: Normal marrow signal.

Sinuses/Orbits: No acute or significant finding. Status post
bilateral lens replacements.

Other: The mastoids are well aerated.

MRA HEAD FINDINGS

Anterior circulation: Both internal carotid arteries are patent to
the termini, with mild irregularity but without significant
stenosis.

Hypoplastic right A1 with severe focal stenosis distally (series 1,
image 101). Mild narrowing in a patent left A1. Normal anterior
communicating artery. Multifocal narrowing in the left ACA, with
severe stenosis in the A2 segment (series 1, image 109), and
complete non opacification of the more distal left ACA (series 1,
images 160-166) . Mild narrowing in the proximal right A2 (series 1,
image 107), with significantly diminished opacification of the
distal right ACA (series 1, image 163) and mild multifocal narrowing
in the distal ACA branches (series 1, image 163).

Mild narrowing in the distal left M1 (series 100, image 102) and in
the proximal inferior left M2 (series 1, image 99). Poor perfusion
of additional branches of the inferior left M2, with intermittent
opacification. Distal left MCA branches are irregular but otherwise
perfused.

Irregularity without focal stenosis in the right M1. Multifocal
narrowing in the M2 and more distal right MCA branches, with focal
poor opacification of some M3 branches (for example series 1, images

Posterior circulation: The left V4 segment and left PICA origin are
patent. The right V4 is poorly opacified proximally and not
opacified distally, with some retrograde flow in the most distal
aspect of the right V4. A prominent right AICA is visualized.

Basilar somewhat irregular but patent to its distal aspect. Superior
cerebellar arteries patent proximally.

Bilateral P1 segments originate from the basilar artery. The left
posterior communicating artery is visualized, although there appears
to be significant stenosis in the midportion of the vessel, with
poor signal. PCAs with multifocal irregularity, as well as focal
stenosis in the left distal P2 (series 1, image 84) and proximal P3
segment (series 1, images 92-96). Poor visualization of the P3s
bilaterally.

Anatomic variants: None significant

MRA NECK FINDINGS

Aortic arch: Three-vessel arch without significant stenosis. No
dissection or aneurysm.

Right carotid system: Mild narrowing at the bifurcation, without
hemodynamically significant stenosis. No evidence of occlusion or
aneurysm.

Left carotid system: Mild narrowing at the bifurcation without
hemodynamically significant stenosis. No evidence of occlusion or
aneurysm.

Vertebral arteries: Normal left vertebral artery, which is patent
from its origin to the vertebrobasilar junction, without significant
stenosis, occlusion, or aneurysm. Multifocal narrowing of the right
V1, which appears patent, with intermittent non opacification of the
right V2 (for example series 17, images 46-48). Intermittent
opacification is also noted in the V3 and V4 segments.

Other: None
IMPRESSION: 1. No acute infarct. Sequela of chronic small vessel disease, with a
remote right parietal cortical infarct that is new compared to [90].
2. Intermittent non opacification of the right V2 and V3 segments,
with non opacification of the majority of the right V4 segment,
likely secondary to multifocal atherosclerotic narrowing. No other
hemodynamically significant stenosis in the neck.
3. No other intracranial proximal large vessel occlusion, however
there is multifocal intracranial atherosclerotic narrowing, with
focal non opacification of the distal ACAs. Additional severe
stenosis or poor perfusion is noted in the distal right A1, proximal
left A2, left M2, distal left P2, bilateral distal MCAs and PCAs,
and left posterior communicating artery.

## 2021-07-19 IMAGING — MR MR MRA HEAD W/O CM
1 series · 48 of 48 positions shown · IV contrast (gadavist)
Comparison: MRI head [DATE], correlation is also made with CT
head [DATE]. No prior MRA

CLINICAL DATA: Weakness and confusion for 3 days, stroke suspected

EXAM:
MRI HEAD WITHOUT CONTRAST
MRA HEAD WITHOUT CONTRAST
MRA NECK WITHOUT AND WITH CONTRAST
TECHNIQUE: Multiplanar, multi-echo pulse sequences of the brain and surrounding
structures were acquired without intravenous contrast. Angiographic
images of the Circle of Willis were acquired using MRA technique
without intravenous contrast. Angiographic images of the neck were
acquired using MRA technique without and with intravenous contrast.
Carotid stenosis measurements (when applicable) are obtained
utilizing NASCET criteria, using the distal internal carotid
diameter as the denominator.
CONTRAST:  7mL GADAVIST GADOBUTROL 1 MMOL/ML IV SOLN

[Series 1: TOF · axial · 0.5mm · 0.76mm/px · z∈[-38,+47]mm · 48 of 184 slices shown]
[im 1/184]
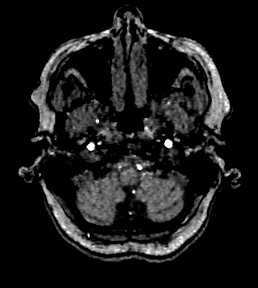
[im 4/184]
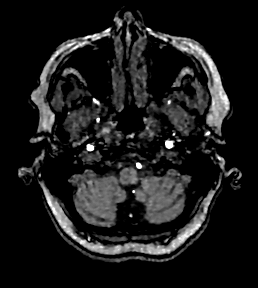
[im 8/184]
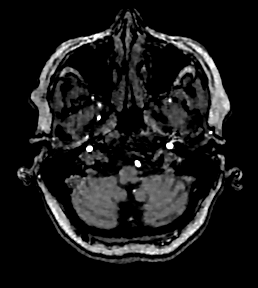
[im 12/184]
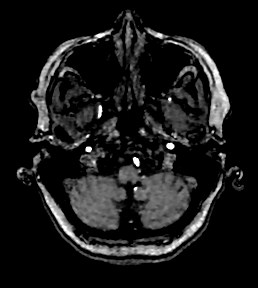
[im 16/184]
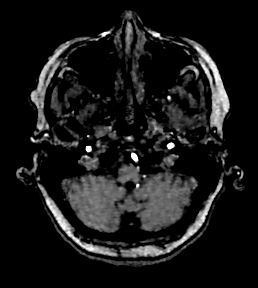
[im 20/184]
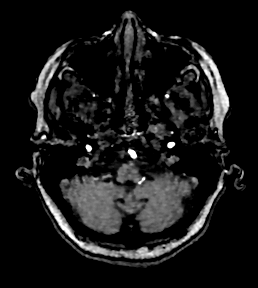
[im 24/184]
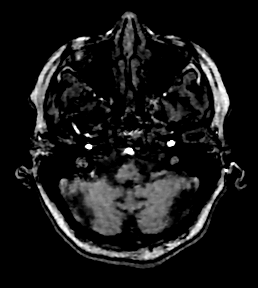
[im 28/184]
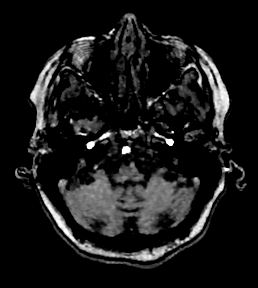
[im 32/184]
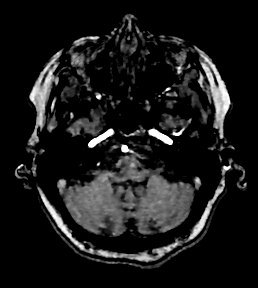
[im 36/184]
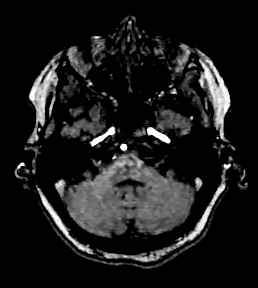
[im 39/184]
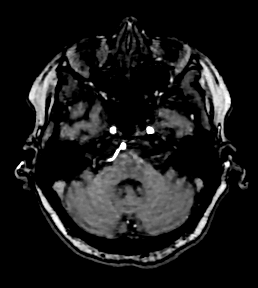
[im 43/184]
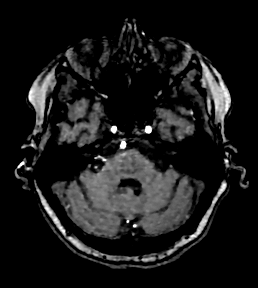
[im 47/184]
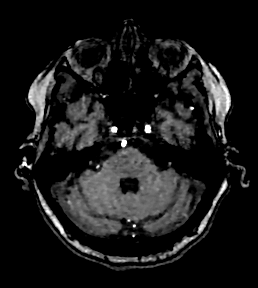
[im 51/184]
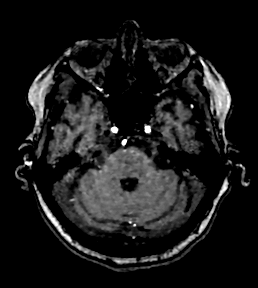
[im 55/184]
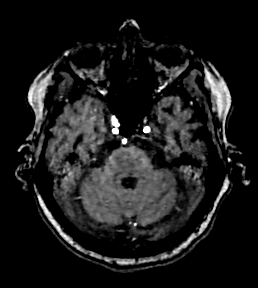
[im 59/184]
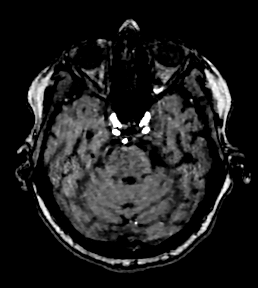
[im 63/184]
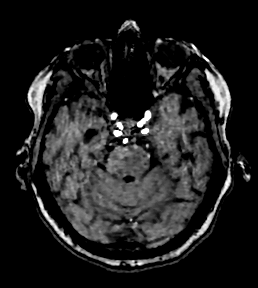
[im 67/184]
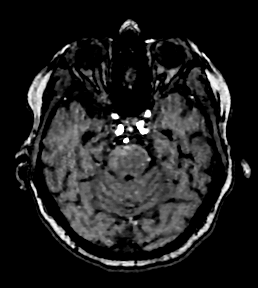
[im 71/184]
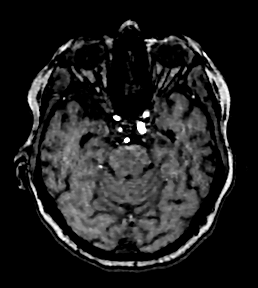
[im 74/184]
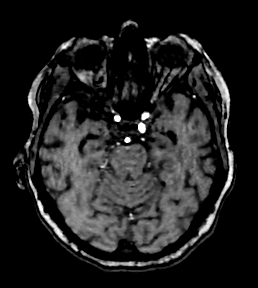
[im 78/184]
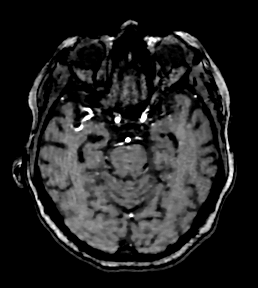
[im 82/184]
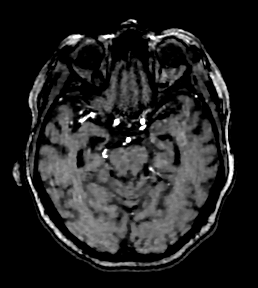
[im 86/184]
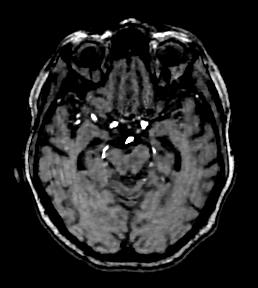
[im 90/184]
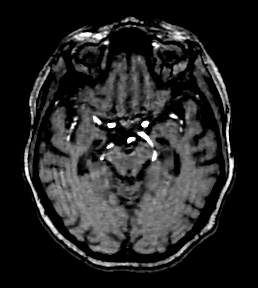
[im 94/184]
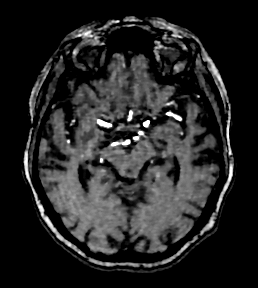
[im 98/184]
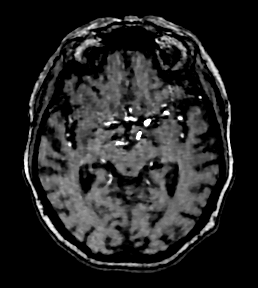
[im 102/184]
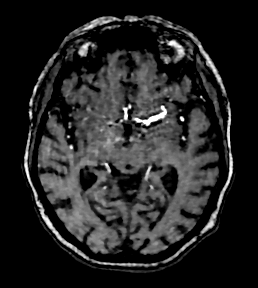
[im 106/184]
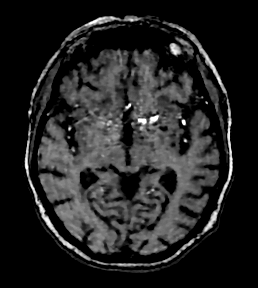
[im 110/184]
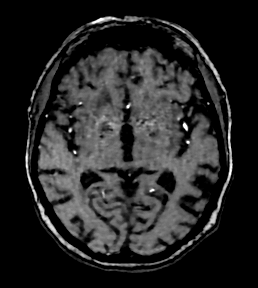
[im 113/184]
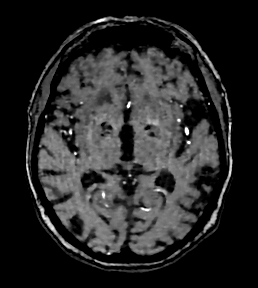
[im 117/184]
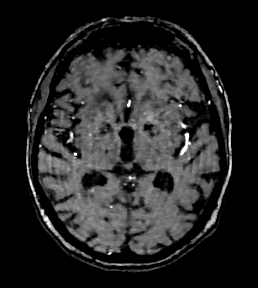
[im 121/184]
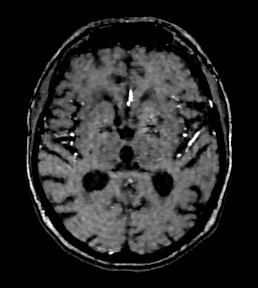
[im 125/184]
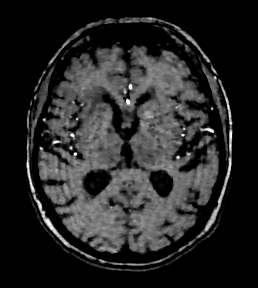
[im 129/184]
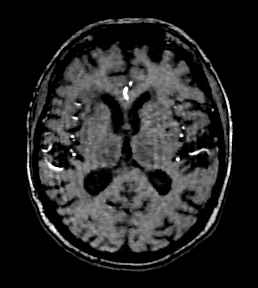
[im 133/184]
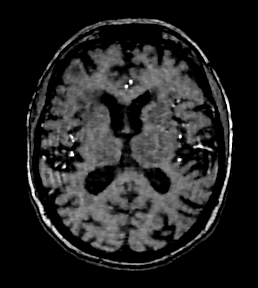
[im 137/184]
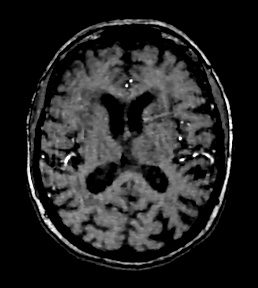
[im 141/184]
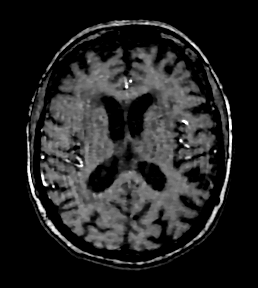
[im 145/184]
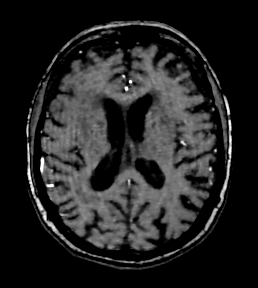
[im 148/184]
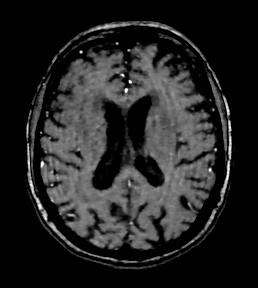
[im 152/184]
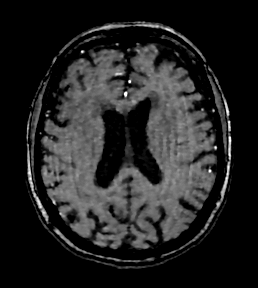
[im 156/184]
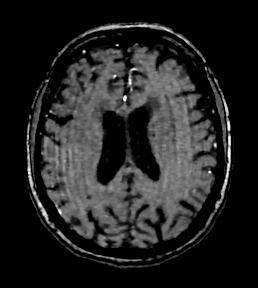
[im 160/184]
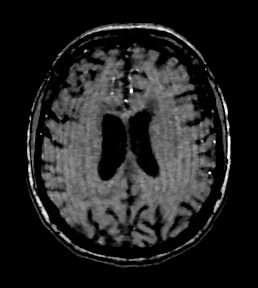
[im 164/184]
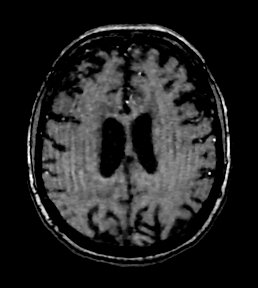
[im 168/184]
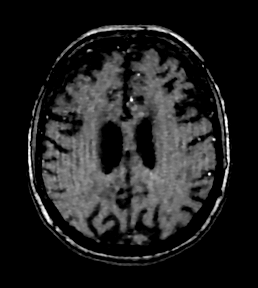
[im 172/184]
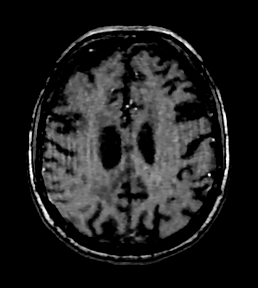
[im 176/184]
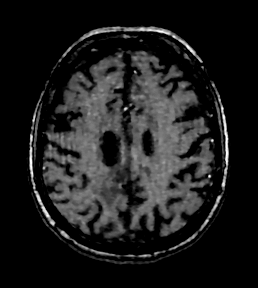
[im 180/184]
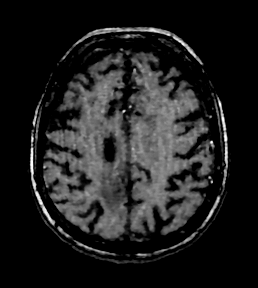
[im 184/184]
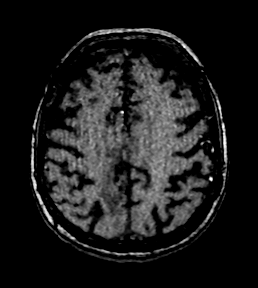

[48 of 48 positions shown; findings below may reference images not displayed]

FINDINGS: MRI HEAD FINDINGS

Brain: No restricted diffusion to suggest acute or subacute infarct.
No acute hemorrhage, mass, mass effect, or midline shift. Area of
susceptibility in the right frontal lobe may be related to a prior
infarct. Focus of susceptibility in the left basal ganglia and
thalamus, likely related to prior hypertensive microhemorrhages. No
hydrocephalus or extra-axial collection. Confluent T2 hyperintense
signal in the periventricular white matter and pons, likely the
sequela of moderate to severe chronic small vessel ischemic disease,
which has progressed from the prior exam. Encephalomalacia in the
medial right parietal lobe (series 13, image 38 and series 12, image
28), likely related to remote parietal cortical infarct, although
this appears new compared to [90].

Vascular: Please see MRA findings below.

Skull and upper cervical spine: Normal marrow signal.

Sinuses/Orbits: No acute or significant finding. Status post
bilateral lens replacements.

Other: The mastoids are well aerated.

MRA HEAD FINDINGS

Anterior circulation: Both internal carotid arteries are patent to
the termini, with mild irregularity but without significant
stenosis.

Hypoplastic right A1 with severe focal stenosis distally (series 1,
image 101). Mild narrowing in a patent left A1. Normal anterior
communicating artery. Multifocal narrowing in the left ACA, with
severe stenosis in the A2 segment (series 1, image 109), and
complete non opacification of the more distal left ACA (series 1,
images 160-166) . Mild narrowing in the proximal right A2 (series 1,
image 107), with significantly diminished opacification of the
distal right ACA (series 1, image 163) and mild multifocal narrowing
in the distal ACA branches (series 1, image 163).

Mild narrowing in the distal left M1 (series 100, image 102) and in
the proximal inferior left M2 (series 1, image 99). Poor perfusion
of additional branches of the inferior left M2, with intermittent
opacification. Distal left MCA branches are irregular but otherwise
perfused.

Irregularity without focal stenosis in the right M1. Multifocal
narrowing in the M2 and more distal right MCA branches, with focal
poor opacification of some M3 branches (for example series 1, images

Posterior circulation: The left V4 segment and left PICA origin are
patent. The right V4 is poorly opacified proximally and not
opacified distally, with some retrograde flow in the most distal
aspect of the right V4. A prominent right AICA is visualized.

Basilar somewhat irregular but patent to its distal aspect. Superior
cerebellar arteries patent proximally.

Bilateral P1 segments originate from the basilar artery. The left
posterior communicating artery is visualized, although there appears
to be significant stenosis in the midportion of the vessel, with
poor signal. PCAs with multifocal irregularity, as well as focal
stenosis in the left distal P2 (series 1, image 84) and proximal P3
segment (series 1, images 92-96). Poor visualization of the P3s
bilaterally.

Anatomic variants: None significant

MRA NECK FINDINGS

Aortic arch: Three-vessel arch without significant stenosis. No
dissection or aneurysm.

Right carotid system: Mild narrowing at the bifurcation, without
hemodynamically significant stenosis. No evidence of occlusion or
aneurysm.

Left carotid system: Mild narrowing at the bifurcation without
hemodynamically significant stenosis. No evidence of occlusion or
aneurysm.

Vertebral arteries: Normal left vertebral artery, which is patent
from its origin to the vertebrobasilar junction, without significant
stenosis, occlusion, or aneurysm. Multifocal narrowing of the right
V1, which appears patent, with intermittent non opacification of the
right V2 (for example series 17, images 46-48). Intermittent
opacification is also noted in the V3 and V4 segments.

Other: None
IMPRESSION: 1. No acute infarct. Sequela of chronic small vessel disease, with a
remote right parietal cortical infarct that is new compared to [90].
2. Intermittent non opacification of the right V2 and V3 segments,
with non opacification of the majority of the right V4 segment,
likely secondary to multifocal atherosclerotic narrowing. No other
hemodynamically significant stenosis in the neck.
3. No other intracranial proximal large vessel occlusion, however
there is multifocal intracranial atherosclerotic narrowing, with
focal non opacification of the distal ACAs. Additional severe
stenosis or poor perfusion is noted in the distal right A1, proximal
left A2, left M2, distal left P2, bilateral distal MCAs and PCAs,
and left posterior communicating artery.

## 2021-07-19 MED ORDER — ACETAMINOPHEN 650 MG RE SUPP: 650.0000 mg | RECTAL | Status: DC | PRN

## 2021-07-19 MED ORDER — ASPIRIN EC 81 MG PO TBEC
81.0000 mg | DELAYED_RELEASE_TABLET | Freq: Every day | ORAL | Status: DC
  Administered 2021-07-19 – 2021-07-20 (×2): 81 mg via ORAL
  Filled 2021-07-19 (×2): qty 1

## 2021-07-19 MED ORDER — ACETAMINOPHEN 160 MG/5ML PO SOLN: 650.0000 mg | ORAL | Status: DC | PRN

## 2021-07-19 MED ORDER — VITAMIN D (ERGOCALCIFEROL) 1.25 MG (50000 UNIT) PO CAPS: 50000.0000 [IU] | ORAL_CAPSULE | ORAL | Status: DC

## 2021-07-19 MED ORDER — INSULIN ASPART 100 UNIT/ML IJ SOLN
0.0000 [IU] | Freq: Three times a day (TID) | INTRAMUSCULAR | Status: DC
  Administered 2021-07-19 – 2021-07-20 (×2): 1 [IU] via SUBCUTANEOUS

## 2021-07-19 MED ORDER — GADOBUTROL 1 MMOL/ML IV SOLN
7.0000 mL | Freq: Once | INTRAVENOUS | Status: AC | PRN
  Administered 2021-07-19: 7 mL via INTRAVENOUS

## 2021-07-19 MED ORDER — INSULIN ASPART 100 UNIT/ML IJ SOLN: 0.0000 [IU] | Freq: Every day | INTRAMUSCULAR | Status: DC

## 2021-07-19 MED ORDER — CLOPIDOGREL BISULFATE 75 MG PO TABS
75.0000 mg | ORAL_TABLET | Freq: Every day | ORAL | Status: DC
  Administered 2021-07-19 – 2021-07-20 (×2): 75 mg via ORAL
  Filled 2021-07-19 (×2): qty 1

## 2021-07-19 MED ORDER — PANTOPRAZOLE SODIUM 40 MG PO TBEC
40.0000 mg | DELAYED_RELEASE_TABLET | Freq: Every day | ORAL | Status: DC
  Administered 2021-07-19 – 2021-07-20 (×2): 40 mg via ORAL
  Filled 2021-07-19 (×2): qty 1

## 2021-07-19 MED ORDER — STROKE: EARLY STAGES OF RECOVERY BOOK: Freq: Once | Status: DC

## 2021-07-19 MED ORDER — ACETAMINOPHEN 325 MG PO TABS: 650.0000 mg | ORAL_TABLET | ORAL | Status: DC | PRN

## 2021-07-19 MED ORDER — NITROFURANTOIN MONOHYD MACRO 100 MG PO CAPS
100.0000 mg | ORAL_CAPSULE | Freq: Two times a day (BID) | ORAL | Status: DC
  Administered 2021-07-19 – 2021-07-20 (×2): 100 mg via ORAL
  Filled 2021-07-19 (×2): qty 1

## 2021-07-19 MED ORDER — SERTRALINE HCL 50 MG PO TABS
50.0000 mg | ORAL_TABLET | Freq: Every day | ORAL | Status: DC
  Administered 2021-07-19 – 2021-07-20 (×2): 50 mg via ORAL
  Filled 2021-07-19 (×2): qty 1

## 2021-07-19 MED ORDER — PRAVASTATIN SODIUM 10 MG PO TABS
20.0000 mg | ORAL_TABLET | Freq: Every day | ORAL | Status: DC
  Administered 2021-07-19: 20 mg via ORAL
  Filled 2021-07-19: qty 2

## 2021-07-19 NOTE — Evaluation (Signed)
Clinical/Bedside Swallow Evaluation Patient Details  Name: Julia [name] MRN: [id number] Date of Birth: 1928/06/17  Today's Date: 07/19/2021 Time: SLP Start Time (ACUTE ONLY): 75 SLP Stop Time (ACUTE ONLY): 1221 SLP Time Calculation (min) (ACUTE ONLY): 16 min  Past Medical History:  Past Medical History:  Diagnosis Date   Alzheimer disease ([place])    Anxiety    CVA (cerebral vascular accident) ([place])    Hypertension    Past Surgical History: History reviewed. No pertinent surgical history. HPI:  Julia [name] is a 88 y.o. female with medical history significant for hypertension, T2DM,, prior stroke with residual left leg weakness who presents to the emergency department from local SNF via EMS due to  slurred speech and left-sided weakness.  Patient was unable to provide history, she notes that she was brought to the ED due to elevated blood pressure.  History was obtained from ED physician and ED medical record.  Per report, after dinner last night, nursing staff checked on the patient and she was found to have a slurred speech and weakness of left arm and leg, EMS was activated and patient was taken to the ED for further evaluation and management.  Symptoms have resolved by the time she got to the ED.  At baseline, she was able to ambulate, though she has not picked disturbance, she also ambulates with wheelchair. BSE/SLE requested.    Assessment / Plan / Recommendation  Clinical Impression  Clinical swallow evaluation completed while Pt seated upright in chair with her daughter in law present. Pt typically consumes regular textures and thin liquids at [facility] and does not have difficulty with swallow. Oral motor examination is WNL. Pt self presented regular textures and thin liquids without signs of aspiration and no reports of globus. Daughter feels that pt is at baseline (has dementia). SLE will be deferred. Continue diet as ordered of regular and thin with standard aspiration and reflux  precautions. SLP will sign off. SLP Visit Diagnosis: Dysphagia, unspecified (R13.10)    Aspiration Risk  No limitations    Diet Recommendation Regular;Thin liquid   Liquid Administration via: Cup;Straw Medication Administration: Whole meds with liquid Supervision: Patient able to self feed;Intermittent supervision to cue for compensatory strategies Compensations: Small sips/bites Postural Changes: Seated upright at 90 degrees;Remain upright for at least 30 minutes after po intake    Other  Recommendations Oral Care Recommendations: Oral care BID Other Recommendations: Clarify dietary restrictions    Recommendations for follow up therapy are one component of a multi-disciplinary discharge planning process, led by the attending physician.  Recommendations may be updated based on patient status, additional functional criteria and insurance authorization.  Follow up Recommendations No SLP follow up      Assistance Recommended at Discharge    Functional Status Assessment Patient has not had a recent decline in their functional status  Frequency and Duration            Prognosis Prognosis for Safe Diet Advancement: Good      Swallow Study   General Date of Onset: 07/18/21 HPI: Julia [name] is a 88 y.o. female with medical history significant for hypertension, T2DM,, prior stroke with residual left leg weakness who presents to the emergency department from local SNF via EMS due to  slurred speech and left-sided weakness.  Patient was unable to provide history, she notes that she was brought to the ED due to elevated blood pressure.  History was obtained from ED physician and ED medical record.  Per report, after dinner  last night, nursing staff checked on the patient and she was found to have a slurred speech and weakness of left arm and leg, EMS was activated and patient was taken to the ED for further evaluation and management.  Symptoms have resolved by the time she got to the ED.  At  baseline, she was able to ambulate, though she has not picked disturbance, she also ambulates with wheelchair. BSE/SLE requested. Type of Study: Bedside Swallow Evaluation Diet Prior to this Study: Regular;Thin liquids Temperature Spikes Noted: No Respiratory Status: Room air History of Recent Intubation: No Behavior/Cognition: Alert;Cooperative;Pleasant mood Oral Cavity Assessment: Within Functional Limits Oral Care Completed by SLP: No Oral Cavity - Dentition: Adequate natural dentition Vision: Functional for self-feeding Self-Feeding Abilities: Able to feed self Patient Positioning: Upright in chair Baseline Vocal Quality: Normal;Hoarse (mild hoarse) Volitional Cough: Strong Volitional Swallow: Able to elicit    Oral/Motor/Sensory Function Overall Oral Motor/Sensory Function: Within functional limits   Ice Chips Ice chips: Not tested   Thin Liquid Thin Liquid: Within functional limits Presentation: Self Fed;Straw    Nectar Thick Nectar Thick Liquid: Not tested   Honey Thick Honey Thick Liquid: Not tested   Puree Puree: Not tested   Solid     Solid: Within functional limits Presentation: Self Fed     Thank you,  Genene Churn, [place]  Taber Sweetser 07/19/2021,1:06 PM

## 2021-07-19 NOTE — Progress Notes (Signed)
CODE STROKE  Call Time  2224 Beeper   none Exam Start  2233 Exam End  2236 [facility]   2236 Epic   2236 Rad Called  2237

## 2021-07-19 NOTE — Progress Notes (Signed)
*  PRELIMINARY RESULTS* Echocardiogram 2D Echocardiogram has been performed.  Julia [name] 07/19/2021, 2:22 PM

## 2021-07-19 NOTE — Plan of Care (Signed)
°  Problem: Acute Rehab PT Goals(only PT should resolve) Goal: Pt Will Go Supine/Side To Sit Outcome: Progressing Flowsheets (Taken 07/19/2021 0918) Pt will go Supine/Side to Sit: with minimal assist Goal: Patient Will Transfer Sit To/From Stand Outcome: Progressing Flowsheets (Taken 07/19/2021 0918) Patient will transfer sit to/from stand: with minimal assist Goal: Pt Will Transfer Bed To Chair/Chair To Bed Outcome: Progressing Flowsheets (Taken 07/19/2021 0918) Pt will Transfer Bed to Chair/Chair to Bed: with min assist   9:19 AM, 07/19/21 Lonell Grandchild, MPT Physical Therapist with [facility] [phone] office [phone] mobile phone

## 2021-07-19 NOTE — TOC Progression Note (Signed)
Transition of Care [facility]) - Progression Note    Patient Details  Name: Kimeka Badour MRN: [id number] Date of Birth: [birth year]  Transition of [facility]) CM/SW Contact  Boneta Lucks, RN Phone Number: 07/19/2021, 3:23 PM  Clinical Narrative:   Patient is in OBS for Ischemic stroke from [facility]. PT is recommending HHPT. They use [facility]. Sarah accepted the referral. MD aware to order. DC plan for tomorrow.    Expected Discharge Plan: [place] Barriers to Discharge: Continued Medical Work up  Expected Discharge Plan and Services Expected Discharge Plan: [place]: PT [place]: Nora Date [place]: 07/19/21 Time [place]: [id number] Representative spoke with at [place]: Judson Roch

## 2021-07-19 NOTE — H&P (Signed)
History and Physical  Julia [name] ZOX:[id number] DOB: 1927/11/20 DOA: 07/18/2021  Referring physician: Godfrey Pick, MD PCP: Pcp, No  Patient coming from: SNF  Chief Complaint: Code stroke  HPI: Julia [name] is a 88 y.o. female with medical history significant for hypertension, T2DM,, prior stroke with residual left leg weakness who presents to the emergency department from local SNF via EMS due to slurred speech and left-sided weakness.  Patient was unable to provide history, she notes that she was brought to the ED due to elevated blood pressure.  History was obtained from ED physician and ED medical record.  Per report, after dinner last night, nursing staff checked on the patient and she was found to have a slurred speech and weakness of left arm and leg, EMS was activated and patient was taken to the ED for further evaluation and management.  Symptoms have resolved by the time she got to the ED.  At baseline, she was able to ambulate, though she has not picked disturbance, she also ambulates with wheelchair.  ED Course:  In the emergency department, BP was 180/64, but other vital signs were within normal range.  Work-up in the ED showed normocytic anemia, hyperglycemia, alcohol level was normal.  Influenza A, B, SARS coronavirus 2 was negative. CT head without contrast showed age--indeterminate posterior right ACA territory infarct involving the parasagittal right frontoparietal region. While this could be chronic in nature, a possible acute to subacute component is difficult to exclude, and could be considered in the correct clinical setting. No intracranial hemorrhage Teleneurologist was consulted and recommended fluoroscopy for  Review of Systems: A full 10 point Review of Systems was done, except as stated above, all other Review of systems were negative.   Past Medical History:  Diagnosis Date   Alzheimer disease ([place])    Anxiety    CVA (cerebral vascular accident) ([place])     Hypertension    History reviewed. No pertinent surgical history.  Social History:  has no history on file for tobacco use, alcohol use, and drug use.   Allergies  Allergen Reactions   Sulfa Antibiotics Rash    Other reaction(s): Other (See Comments)     History reviewed. No pertinent family history.   Prior to Admission medications   Not on File    Physical Exam: BP (!) 155/58 (BP Location: Left Arm)    Pulse 72    Temp 98 F (36.7 C)    Resp 18    Ht 5\' 6"  (1.676 m)    Wt 74.8 kg Comment: pt. was not able to stand nurse was award of it and also the different in the weight I mention to   SpO2 100%    BMI 26.62 kg/m   General: 88 y.o. year-old female well developed well nourished in no acute distress.   HEENT: NCAT, EOMI Neck: Supple, trachea medial Cardiovascular: Regular rate and rhythm with no rubs or gallops.  No thyromegaly or JVD noted.  No lower extremity edema. 2/4 pulses in all 4 extremities. Respiratory: Clear to auscultation with no wheezes or rales. Good inspiratory effort. Abdomen: Soft, nontender nondistended with normal bowel sounds x4 quadrants. Muskuloskeletal: No cyanosis, clubbing or edema noted bilaterally Neuro: CN II-XII intact, left arm and left leg weakness noted. Sensation, reflexes intact Skin: No ulcerative lesions noted or rashes Psychiatry: Mood is appropriate for condition and setting          Labs on Admission:  Basic Metabolic Panel: Recent Labs  Lab 07/18/21 2329  07/18/21 2334  NA 138 140  K 4.1 4.1  CL 104 104  CO2 25  --   GLUCOSE 160* 156*  BUN 25* 24*  CREATININE 1.12* 1.20*  CALCIUM 9.5  --    Liver Function Tests: Recent Labs  Lab 07/18/21 2329  AST 20  ALT 18  ALKPHOS 68  BILITOT 0.4  PROT 7.1  ALBUMIN 4.0   No results for input(s): LIPASE, AMYLASE in the last 168 hours. No results for input(s): AMMONIA in the last 168 hours. CBC: Recent Labs  Lab 07/18/21 2329 07/18/21 2334  WBC 7.1  --   NEUTROABS 4.6  --    HGB 11.5* 11.6*  HCT 35.3* 34.0*  MCV 88.0  --   PLT 221  --    Cardiac Enzymes: No results for input(s): CKTOTAL, CKMB, CKMBINDEX, TROPONINI in the last 168 hours.  BNP (last 3 results) No results for input(s): BNP in the last 8760 hours.  ProBNP (last 3 results) No results for input(s): PROBNP in the last 8760 hours.  CBG: Recent Labs  Lab 07/18/21 2239  GLUCAP 187*    Radiological Exams on Admission: CT HEAD CODE STROKE WO CONTRAST  Result Date: 07/18/2021 CLINICAL DATA:  Code stroke. EXAM: CT HEAD WITHOUT CONTRAST TECHNIQUE: Contiguous axial images were obtained from the base of the skull through the vertex without intravenous contrast. RADIATION DOSE REDUCTION: This exam was performed according to the departmental dose-optimization program which includes automated exposure control, adjustment of the mA and/or kV according to patient size and/or use of iterative reconstruction technique. COMPARISON:  Prior CT from 11/13/2020. FINDINGS: Brain: Diffuse prominence of the CSF containing spaces compatible generalized cerebral atrophy. Patchy and confluent hypodensity involving the supratentorial cerebral white matter most consistent with chronic small vessel ischemic disease, moderate in nature. Few scatter remote lacunar infarcts present about the deep gray nuclei. No acute intracranial hemorrhage. Hypodensity involving the parasagittal right frontoparietal region adjacent to the falx consistent with a right ACA territory ischemic infarct, age indeterminate. Upon review of prior CT from 11/13/2020, there is suggestion of a small acute ischemic infarct at this location on prior head CT, although the area involved on today's exam appears larger, and a superimposed acute component would be difficult to exclude. No other visible large vessel territory infarct. No mass lesion, mass effect, or midline shift. No hydrocephalus or extra-axial fluid collection. Vascular: No visible hyperdense  vessel. Calcified atherosclerosis present at the skull base. Skull: Scalp soft tissues and calvarium within normal limits. Sinuses/Orbits: Globes and orbital soft tissues demonstrate no acute finding. Visualized paranasal sinuses and mastoid air cells are clear. Other: None. ASPECTS ([place] Stroke Program Early CT Score) - Ganglionic level infarction (caudate, lentiform nuclei, internal capsule, insula, M1-M3 cortex): 7 - Supraganglionic infarction (M4-M6 cortex): 3 Total score (0-10 with 10 being normal): 10 IMPRESSION: 1. Age-indeterminate posterior right ACA territory infarct involving the parasagittal right frontoparietal region. While this could be chronic in nature, a possible acute to subacute component is difficult to exclude, and could be considered in the correct clinical setting. No intracranial hemorrhage. 2. ASPECTS is 10. 3. Underlying age-related cerebral atrophy with moderate chronic microvascular ischemic disease. Critical Value/emergent results were called by telephone at the time of interpretation on 07/18/2021 at 10:51 pm to provider Godfrey Pick , who verbally acknowledged these results. Electronically Signed   By: Jeannine Boga M.D.   On: 07/18/2021 22:58    EKG: I independently viewed the EKG done and my findings are as followed:  Normal sinus rhythm at a rate of 71 bpm  Assessment/Plan Present on Admission:  Ischemic stroke ([place])  Principal Problem:   Ischemic stroke ([place]) Active Problems:   Hyperglycemia due to diabetes mellitus ([place])   Essential hypertension   Alzheimer's disease ([place])  Possible acute to sub-acute ischemic stroke CT head without contrast showed age--indeterminate posterior right ACA territory infarct involving the parasagittal right frontoparietal region with possible acute to subacute component Patient will be admitted to telemetry unit  Echocardiogram in the morning MRI of brain without contrast in the morning MRA Head and neck Continue aspirin  81mg  daily; consider starting statin Continue fall precautions and neuro checks Lipid panel and hemoglobin A1c will be checked Continue PT/SLP/OT eval and treat Bedside swallow eval by nursing prior to diet Tele neurology was consulted for further recommendation status post imaging studies  Hyperglycemia secondary to T2DM Hemoglobin A1c will be checked Continue ISS and hypoglycemic protocol  Hypertension  Antihypertensives PRN if Blood pressure is greater than 220/120 or there is a concern for End organ damage/contraindications for permissive HTN. If blood pressure is greater than 220/120 give labetalol PO or IV or Vasotec IV with a goal of 15% reduction in BP during the first 24 hours.  Alzheimer's disease Continue home meds when med rec is completed    DVT prophylaxis: SCDs  Code Status: DNR  Family Communication: None at bedside  Disposition Plan:  Patient is from:                        home Anticipated DC to:                   SNF or family members home Anticipated DC date:               2-3 days Anticipated DC barriers:        Patient requires inpatient management to pending further stroke work-up  Consults called: Teleneurology  Admission status: Observation    Bernadette Hoit MD [facility]   07/19/2021, 7:22 AM

## 2021-07-19 NOTE — Evaluation (Signed)
Occupational Therapy Evaluation Patient Details Name: Julia [name] MRN: [id number] DOB: 13-Jan-1928 Today's Date: 07/19/2021   History of Present Illness Julia [name] is a 88 y.o. female with medical history significant for hypertension, T2DM,, prior stroke with residual left leg weakness who presents to the emergency department from local SNF via EMS due to  slurred speech and left-sided weakness.  Patient was unable to provide history, she notes that she was brought to the ED due to elevated blood pressure.  History was obtained from ED physician and ED medical record.  Per report, after dinner last night, nursing staff checked on the patient and she was found to have a slurred speech and weakness of left arm and leg, EMS was activated and patient was taken to the ED for further evaluation and management.  Symptoms have resolved by the time she got to the ED.  At baseline, she was able to ambulate, though she has not picked disturbance, she also ambulates with wheelchair.   Clinical Impression   Pt confused but agreeable to OT/PT co-evaluation. Pt not oriented to place or year today. Pt is coming from an ALF and was likely assisted for most ADL's and transfers. Pt has limited functional use of L UE due to previous stroke and possibly worsened from recent issues. Pt requires mod A for stand pivot transfer with RW with assist to hold L UE to RW. Pt required mod to total assist for ADL's today. Pt will benefit from continued OT in the hospital and recommended venue below to increase strength, balance, and endurance for safe ADL's.        Recommendations for follow up therapy are one component of a multi-disciplinary discharge planning process, led by the attending physician.  Recommendations may be updated based on patient status, additional functional criteria and insurance authorization.   Follow Up Recommendations  Home health OT    Assistance Recommended at Discharge Frequent or constant  Supervision/Assistance  Patient can return home with the following A lot of help with walking and/or transfers;A lot of help with bathing/dressing/bathroom;Assistance with cooking/housework;Assistance with feeding;Direct supervision/assist for financial management;Direct supervision/assist for medications management;Assist for transportation;Help with stairs or ramp for entrance    Functional Status Assessment  Patient has had a recent decline in their functional status and/or demonstrates limited ability to make significant improvements in function in a reasonable and predictable amount of time  Equipment Recommendations  None recommended by OT           Precautions / Restrictions Precautions Precautions: Fall Restrictions Weight Bearing Restrictions: No      Mobility Bed Mobility Overal bed mobility: Needs Assistance Bed Mobility: Supine to Sit     Supine to sit: Min assist, Mod assist     General bed mobility comments: Slow labored movement with assist to move B LE with hand held assist.    Transfers Overall transfer level: Needs assistance Equipment used: Rolling walker (2 wheels) Transfers: Sit to/from Stand, Bed to chair/wheelchair/BSC Sit to Stand: Mod assist Stand pivot transfers: Mod assist         General transfer comment: PT assisting pt to chair with use of RW; poor standing balance uanble to step as part of transfer.      Balance Overall balance assessment: Needs assistance Sitting-balance support: No upper extremity supported, Feet supported Sitting balance-Leahy Scale: Fair Sitting balance - Comments: fair to good seated at EOB   Standing balance support: Bilateral upper extremity supported, During functional activity, Reliant on assistive device for  balance Standing balance-Leahy Scale: Poor Standing balance comment: using RW                           ADL either performed or assessed with clinical judgement   ADL Overall ADL's :  Needs assistance/impaired Eating/Feeding: Set up;Minimal assistance;Sitting   Grooming: Moderate assistance;Sitting;Minimal assistance       Lower Body Bathing: Maximal assistance;Total assistance;Bed level   Upper Body Dressing : Moderate assistance;Bed level Upper Body Dressing Details (indicate cue type and reason): Pt required assist to don gown at bed level. Lower Body Dressing: Total assistance;Bed level   Toilet Transfer: Moderate assistance;Stand-pivot;Rolling walker (2 wheels) Toilet Transfer Details (indicate cue type and reason): simulated via EOB to chair Toileting- Clothing Manipulation and Hygiene: Maximal assistance;Total assistance;Bed level       Functional mobility during ADLs: Moderate assistance;Maximal assistance;Rolling walker (2 wheels) General ADL Comments: Pt demosntrates poor balance and coordination needing physical assist for mobility. Limited by minimal functional use of L UE.     Vision Baseline Vision/History: 0 No visual deficits Ability to See in Adequate Light: 0 Adequate Patient Visual Report: No change from baseline Vision Assessment?: Yes Tracking/Visual Pursuits: Other (comment) (see additional notes) Visual Fields: Other (comment) (Pt noted to have R side gaze preference at times. Difficulty to fully assess due to pt cognitive status.) Additional Comments: Pt unable to follow object for visual tracking, cognition likely impacting this.                Pertinent Vitals/Pain Pain Assessment Pain Assessment: No/denies pain     Hand Dominance Right (Pt using R hand primarily. L affected by previous stroke.)   Extremity/Trunk Assessment Upper Extremity Assessment Upper Extremity Assessment: RUE deficits/detail;LUE deficits/detail RUE Deficits / Details: 3-/5 shoulder, 3/5 elbow, 3+/5 grip. Pt able to open small container without assist. RUE Coordination: decreased gross motor LUE Deficits / Details: 2+/5 shoulder during functional  tasks; 2+/5 elbow with noted limitation of ~20* in extension with moderate tone near end range. Trace activation of composite flexion. LUE Coordination: decreased fine motor;decreased gross motor   Lower Extremity Assessment Lower Extremity Assessment: Defer to PT evaluation   Cervical / Trunk Assessment Cervical / Trunk Assessment: Kyphotic   Communication Communication Communication: Expressive difficulties   Cognition Arousal/Alertness: Awake/alert Behavior During Therapy: Flat affect Overall Cognitive Status: History of cognitive impairments - at baseline                                 General Comments: Pt not oriented this date. Pt able to intermittently respond to prior history questions.                      Home Living Family/patient expects to be discharged to:: Assisted living                                 Additional Comments: Pt confused and unable to fully report history.      Prior Functioning/Environment Prior Level of Function : Needs assist  Cognitive Assist : Mobility (cognitive);ADLs (cognitive)     Physical Assist : Mobility (physical);ADLs (physical) Mobility (physical): Bed mobility;Transfers;Gait;Stairs ADLs (physical): Feeding;Grooming;Bathing;Dressing;Toileting;IADLs Mobility Comments: Pt confused and unable to fully report history. Pt reported needing assist for transfers. ADLs Comments: Pt coming from ALF and likely assisted for ADL's.  Pt reported someone helps her put her socks on.        OT Problem List: Decreased strength;Decreased range of motion;Decreased activity tolerance;Impaired balance (sitting and/or standing);Decreased coordination;Impaired vision/perception;Decreased cognition;Decreased safety awareness;Impaired tone;Impaired UE functional use      OT Treatment/Interventions: Self-care/ADL training;Therapeutic exercise;Therapeutic activities;Patient/family education;Balance training;Cognitive  remediation/compensation;Visual/perceptual remediation/compensation;Manual therapy;Splinting;Neuromuscular education    OT Goals(Current goals can be found in the care plan section) Acute Rehab OT Goals Patient Stated Goal: not stated OT Goal Formulation: Patient unable to participate in goal setting Time For Goal Achievement: 08/02/21 Potential to Achieve Goals: Fair  OT Frequency: Min 2X/week    Co-evaluation PT/OT/SLP Co-Evaluation/Treatment: Yes Reason for Co-Treatment: Complexity of the patient's impairments (multi-system involvement)   OT goals addressed during session: ADL's and self-care      AM-PAC OT "6 Clicks" Daily Activity     Outcome Measure Help from another person eating meals?: A Little Help from another person taking care of personal grooming?: A Lot Help from another person toileting, which includes using toliet, bedpan, or urinal?: Total Help from another person bathing (including washing, rinsing, drying)?: Total Help from another person to put on and taking off regular upper body clothing?: A Lot Help from another person to put on and taking off regular lower body clothing?: A Lot 6 Click Score: 11   End of Session Equipment Utilized During Treatment: Rolling walker (2 wheels) Nurse Communication: Other (comment);Mobility status (NT notified of pt mobility status)  Activity Tolerance: Patient tolerated treatment well Patient left: in chair;with chair alarm set;with call bell/phone within reach  OT Visit Diagnosis: Unsteadiness on feet (R26.81);Other abnormalities of gait and mobility (R26.89);Muscle weakness (generalized) (M62.81);Other symptoms and signs involving cognitive function;Hemiplegia and hemiparesis Hemiplegia - Right/Left: Left Hemiplegia - dominant/non-dominant: Non-Dominant (per observation) Hemiplegia - caused by:  (acute or sub acute infarct)                Time: 2703-5009 OT Time Calculation (min): 23 min Charges:  OT General Charges $OT  Visit: 1 Visit OT Evaluation $OT Eval Moderate Complexity: 1 Mod  Julia [name] OT, MOT   [employer] 07/19/2021, 9:02 AM

## 2021-07-19 NOTE — Evaluation (Signed)
Physical Therapy Evaluation Patient Details Name: Julia [name] MRN: [id number] DOB: Feb 17, 1928 Today's Date: 07/19/2021  History of Present Illness  Julia [name] is a 88 y.o. female with medical history significant for hypertension, T2DM,, prior stroke with residual left leg weakness who presents to the emergency department from local SNF via EMS due to  slurred speech and left-sided weakness.  Patient was unable to provide history, she notes that she was brought to the ED due to elevated blood pressure.  History was obtained from ED physician and ED medical record.  Per report, after dinner last night, nursing staff checked on the patient and she was found to have a slurred speech and weakness of left arm and leg, EMS was activated and patient was taken to the ED for further evaluation and management.  Symptoms have resolved by the time she got to the ED.  At baseline, she was able to ambulate, though she has not picked disturbance, she also ambulates with wheelchair.   Clinical Impression  Patient presents with flat affect and poor historian for answering questions able to participate with therapy with repeated verbal/tactile cueing.  Patient demonstrates poor carryover for attempting to transfer using RW and required Mod assist to complete stand pivot taking a few steps before having to sit due to BLE weakness.  Patient tolerated sitting up in chair after therapy - nursing staff aware.  Patient will benefit from continued skilled physical therapy in hospital and recommended venue below to increase strength, balance, endurance for safe ADLs and gait.         Recommendations for follow up therapy are one component of a multi-disciplinary discharge planning process, led by the attending physician.  Recommendations may be updated based on patient status, additional functional criteria and insurance authorization.  Follow Up Recommendations Home health PT    Assistance Recommended at Discharge  Intermittent Supervision/Assistance  Patient can return home with the following  A lot of help with walking and/or transfers;A lot of help with bathing/dressing/bathroom;Help with stairs or ramp for entrance;Assistance with cooking/housework    Equipment Recommendations None recommended by PT  Recommendations for Other Services       Functional Status Assessment Patient has had a recent decline in their functional status and demonstrates the ability to make significant improvements in function in a reasonable and predictable amount of time.     Precautions / Restrictions Precautions Precautions: Fall Restrictions Weight Bearing Restrictions: No      Mobility  Bed Mobility               General bed mobility comments: as per OT notes    Transfers                   General transfer comment: as per OT notes    Ambulation/Gait Ambulation/Gait assistance: Mod assist Gait Distance (Feet): 3 Feet Assistive device: 1 person hand held assist Gait Pattern/deviations: Decreased step length - left, Decreased step length - right, Decreased stride length, Shuffle Gait velocity: decreased     General Gait Details: limited to a few shuffling slow labored unsteady side steps with bilateral hand held assist  Stairs            Wheelchair Mobility    Modified Rankin (Stroke Patients Only)       Balance Overall balance assessment: Needs assistance Sitting-balance support: Feet supported, No upper extremity supported Sitting balance-Leahy Scale: Fair Sitting balance - Comments: fair to good seated at EOB   Standing balance support:  Bilateral upper extremity supported, During functional activity, Reliant on assistive device for balance Standing balance-Leahy Scale: Poor Standing balance comment: using RW, hand held assist                             Pertinent Vitals/Pain Pain Assessment Pain Assessment: No/denies pain    Home Living  Family/patient expects to be discharged to:: Assisted living                   Additional Comments: Pt confused and unable to fully report history.    Prior Function Prior Level of Function : Needs assist  Cognitive Assist : Mobility (cognitive);ADLs (cognitive)     Physical Assist : Mobility (physical);ADLs (physical) Mobility (physical): Bed mobility;Transfers;Gait;Stairs ADLs (physical): Feeding;Grooming;Bathing;Dressing;Toileting;IADLs Mobility Comments: Per chart patient was mostly w/c bound and assisted for transfers ADLs Comments: assisted by ALF staff     Hand Dominance   Dominant Hand: Right    Extremity/Trunk Assessment   Upper Extremity Assessment Upper Extremity Assessment: Defer to OT evaluation RUE Deficits / Details: 3-/5 shoulder, 3/5 elbow, 3+/5 grip. Pt able to open small container without assist. RUE Coordination: decreased gross motor LUE Deficits / Details: 2+/5 shoulder during functional tasks; 2+/5 elbow with noted limitation of ~20* in extension with moderate tone near end range. Trace activation of composite flexion. LUE Coordination: decreased fine motor;decreased gross motor    Lower Extremity Assessment Lower Extremity Assessment: Generalized weakness;LLE deficits/detail LLE Deficits / Details: grossly 3+/5 LLE Coordination: decreased gross motor;decreased fine motor    Cervical / Trunk Assessment Cervical / Trunk Assessment: Kyphotic  Communication   Communication: Expressive difficulties  Cognition Arousal/Alertness: Awake/alert Behavior During Therapy: Flat affect Overall Cognitive Status: History of cognitive impairments - at baseline                                          General Comments      Exercises     Assessment/Plan    PT Assessment Patient needs continued PT services  PT Problem List Decreased strength;Decreased activity tolerance;Decreased balance;Decreased mobility       PT Treatment  Interventions DME instruction;Gait training;Functional mobility training;Therapeutic activities;Therapeutic exercise;Wheelchair mobility training;Balance training    PT Goals (Current goals can be found in the Care Plan section)  Acute Rehab PT Goals Patient Stated Goal: return home PT Goal Formulation: With patient Time For Goal Achievement: 07/26/21 Potential to Achieve Goals: Good    Frequency Min 3X/week     Co-evaluation PT/OT/SLP Co-Evaluation/Treatment: Yes Reason for Co-Treatment: Complexity of the patient's impairments (multi-system involvement);To address functional/ADL transfers PT goals addressed during session: Mobility/safety with mobility;Balance;Proper use of DME OT goals addressed during session: ADL's and self-care       AM-PAC PT "6 Clicks" Mobility  Outcome Measure Help needed turning from your back to your side while in a flat bed without using bedrails?: A Lot Help needed moving from lying on your back to sitting on the side of a flat bed without using bedrails?: A Lot Help needed moving to and from a bed to a chair (including a wheelchair)?: A Lot Help needed standing up from a chair using your arms (e.g., wheelchair or bedside chair)?: A Lot Help needed to walk in hospital room?: A Lot Help needed climbing 3-5 steps with a railing? : Total 6 Click Score: 11  End of Session   Activity Tolerance: Patient tolerated treatment well;Patient limited by fatigue Patient left: in chair;with call bell/phone within reach;with chair alarm set Nurse Communication: Mobility status PT Visit Diagnosis: Unsteadiness on feet (R26.81);Other abnormalities of gait and mobility (R26.89);Muscle weakness (generalized) (M62.81)    Time: 8527-7824 PT Time Calculation (min) (ACUTE ONLY): 13 min   Charges:   PT Evaluation $PT Eval Low Complexity: 1 Low PT Treatments $Therapeutic Activity: 8-22 mins        9:17 AM, 07/19/21 Lonell Grandchild, MPT Physical Therapist with  [facility] [phone] office [phone] mobile phone

## 2021-07-19 NOTE — Progress Notes (Signed)
EEG completed, results pending. 

## 2021-07-19 NOTE — Progress Notes (Signed)
Patient admitted to the hospital earlier this morning by Dr. Josephine Cables  She is from an assisted living facility.  She has prior history of CVA with residual left-sided weakness.  She was admitted with worsening left-sided weakness and transient slurred speech.  There was concern for new CVA and she was admitted for further work-up.  MRI of the brain did not show any acute stroke.  She was seen by neurology and EEG has been ordered for further evaluation.  Echocardiogram was also noted to be unrevealing.  Seen by PT/OT with plans for home health.  Urinalysis indicated possible infection, urine culture in process.  She was started on Macrobid.  Anticipate discharge back to assisted living in the next 24 hours if EEG unremarkable.  [employer]

## 2021-07-19 NOTE — Consult Note (Signed)
[facility] Telemedicine Consult   Requesting Provider: Dr. Roderic Palau  Chief Complaint: TIA  HPI:  Ms. Abeln is a 88 year-old woman with a PMH of HTN, DM, Alzheimer's Disease, Stroke (residual left leg weakness) who was admitted from local SNF for left sided weakness, left facial droop and slurred speech after dinner last night.   Pt can not provide any history but I call her daughter [name]. Per [name], pt has had several stroke and TIAs in the last 1.5-2 years. The last one was in 08/2020 when she was admitted to [facility] with left sided weakness. With rehab later, her left arm weakness was recovered but her leg weakness remained and she could not walk anymore. At baseline in SNF, she was wheelchair bound but able to standup from wheelchair but not able to walk. Since then, from time to time when [name] visited her in SNF, she may have some left facial droop or left arm weakness but resolves the second day. Pt self has no complains of that and nobody can tell what it is and no specific management for that.   Last night, after dinner, SNF staff found pt has left facial droop, left sided weakness with slurry speech. She was sent to ED by EMS. Symptoms were resolved on arrival, no tPA given. BP 180s, and CT head showed age--indeterminate posterior right ACA territory infarct involving the parasagittal right frontoparietal region. However, this is new comparing with her 10/2020 CT head.   LKW: 1928 last night tpa given?: No, symptoms resolved   Exam: Vitals:   07/19/21 0415 07/19/21 0857  BP: (!) 155/58 (!) 158/69  Pulse: 72 74  Resp: 18 18  Temp:  98.6 F (37 C)  SpO2: 100% 100%     Temp:  [98 F (36.7 C)-98.6 F (37 C)] 98.6 F (37 C) (01/18 0857) Pulse Rate:  [72-95] 74 (01/18 0857) Resp:  [16-20] 18 (01/18 0857) BP: (131-189)/(58-81) 158/69 (01/18 0857) SpO2:  [96 %-100 %] 100 % (01/18 0857) Weight:  [74.8 kg-79.6 kg] 74.8 kg (01/18 0558)  General - Well  nourished, well developed, in no apparent distress.  Ophthalmologic - fundi not visualized due to noncooperation.  Cardiovascular - Regular rhythm and rate.  Neuro - awake, alert, eyes open, orientated to place and self, but not to age, time or people. No aphasia, able to speak short sentences but paucity of speech, following all simple commands. Able to name 1/3 and repeat simple sentences. No gaze palsy, tracking bilaterally, blinking to visual threat bilaterally. No facial droop. Tongue midline. RUE raise up high without drift. LUE raise up in low position, against gravity without drift. L hand grip weaker than right. RLE 3/5 proximal and 4/5 distal toe PF/DF, LLE 2/5 proximal and 3/5 distal toe PF/DF. Sensation symmetrical bilaterally subjectively, right FTN intact but slow, gait not tested.    Imaging Reviewed:  CT HEAD CODE STROKE WO CONTRAST  Result Date: 07/18/2021 CLINICAL DATA:  Code stroke. EXAM: CT HEAD WITHOUT CONTRAST TECHNIQUE: Contiguous axial images were obtained from the base of the skull through the vertex without intravenous contrast. RADIATION DOSE REDUCTION: This exam was performed according to the departmental dose-optimization program which includes automated exposure control, adjustment of the mA and/or kV according to patient size and/or use of iterative reconstruction technique. COMPARISON:  Prior CT from 11/13/2020. FINDINGS: Brain: Diffuse prominence of the CSF containing spaces compatible generalized cerebral atrophy. Patchy and confluent hypodensity involving the supratentorial cerebral white matter most consistent with chronic small  vessel ischemic disease, moderate in nature. Few scatter remote lacunar infarcts present about the deep gray nuclei. No acute intracranial hemorrhage. Hypodensity involving the parasagittal right frontoparietal region adjacent to the falx consistent with a right ACA territory ischemic infarct, age indeterminate. Upon review of prior CT from  11/13/2020, there is suggestion of a small acute ischemic infarct at this location on prior head CT, although the area involved on today's exam appears larger, and a superimposed acute component would be difficult to exclude. No other visible large vessel territory infarct. No mass lesion, mass effect, or midline shift. No hydrocephalus or extra-axial fluid collection. Vascular: No visible hyperdense vessel. Calcified atherosclerosis present at the skull base. Skull: Scalp soft tissues and calvarium within normal limits. Sinuses/Orbits: Globes and orbital soft tissues demonstrate no acute finding. Visualized paranasal sinuses and mastoid air cells are clear. Other: None. ASPECTS ([place] Stroke Program Early CT Score) - Ganglionic level infarction (caudate, lentiform nuclei, internal capsule, insula, M1-M3 cortex): 7 - Supraganglionic infarction (M4-M6 cortex): 3 Total score (0-10 with 10 being normal): 10 IMPRESSION: 1. Age-indeterminate posterior right ACA territory infarct involving the parasagittal right frontoparietal region. While this could be chronic in nature, a possible acute to subacute component is difficult to exclude, and could be considered in the correct clinical setting. No intracranial hemorrhage. 2. ASPECTS is 10. 3. Underlying age-related cerebral atrophy with moderate chronic microvascular ischemic disease. Critical Value/emergent results were called by telephone at the time of interpretation on 07/18/2021 at 10:51 pm to provider Godfrey Pick , who verbally acknowledged these results. Electronically Signed   By: Jeannine Boga M.D.   On: 07/18/2021 22:58     Labs reviewed in epic and pertinent values follow:  Latest Reference Range & Units 07/19/21 07:11  Total CHOL/HDL Ratio RATIO 5.5  Cholesterol 0 - 200 mg/dL 144  HDL Cholesterol >40 mg/dL 26 (L)  LDL (calc) 0 - 99 mg/dL 93  Triglycerides <150 mg/dL 124  VLDL 0 - 40 mg/dL 25   Assessment:  Ms. Julia [name] is a 88 year-old woman with  a PMH of HTN, DM, Alzheimer's Disease, Stroke (residual left leg weakness) who was admitted from local SNF for left sided weakness, left facial droop and slurred speech after dinner last night. Pt had stroke in 08/2020 with residue left leg weakness per daughter and wheelchair bound since. Symptoms were resolved on arrival, no tPA given. BP 180s, and CT head showed age--indeterminate posterior right ACA territory infarct involving the parasagittal right frontoparietal region. However, this is new comparing with her 10/2020 CT head.   Etiology for current event could be TIA vs. Minor stroke, however, hard to distinguish with her baseline deficit. CT showed new hypodensity since 10/2020. Will need MRI for further evaluation. MRA head and neck as well as echo pending. Pt still NPO for now, will do swallow screen, if pass, can start po meds.   Recommendations:  Continue further stroke work up  Frequent neuro checks Telemetry monitoring MRI brain pending MRA head and neck pending Echocardiogram pending Swallow screen stat, if pass will start po meds.  PT/OT/speech consult GI and DVT prophylaxis  ASA PR 300 mg if no po access. Will consider DAPT once po access Statin once po access. Stroke risk factor modification We will follow    Consult Participants: pt, RN and me Location of the provider: Home Location of the patient: [facility]  This consult was provided via telemedicine with 2-way video and audio communication. The patient/family was informed that care would  be provided in this way and agreed to receive care in this manner.   I had long discussion with pt daughter [name] over the phone, updated pt current condition, treatment plan and potential prognosis, and answered all the questions. She expressed understanding and appreciation.    This patient is receiving care for possible acute neurological changes. There was 60 minutes of care by this provider at the time of service, including time for  direct evaluation via telemedicine, review of medical records, imaging studies and discussion of findings with providers, the patient and/or family.  Rosalin Hawking, MD PhD Stroke Neurology 07/19/2021 8:57 AM

## 2021-07-19 NOTE — Procedures (Signed)
ROUTINE EEG REPORT DATE OF STUDY:  07/19/21 from 16:55 to 17:35  HISTORY: 88yo woman with prior stroke with residual left sided weakness with episode of left sided weakness and slurred speech.  DESCRIPTION: During maximal wakefulness, the background was organized and continuous but mildly slow.  There was a posterior dominant rhythm seen up to 6 Hz. Spontaneous variability and reactivity to stimulation were present.  Stage I (N1) sleep was recorded, with alpha dropout, slow roving eye movements, high-voltage centrally predominant vertex waves, and positive occipital sharp transients of sleep (POSTS). Stage II (N2) sleep was recorded, with bilateral and symmetric K complexes, and sleep spindles at 12-16 Hz. There was a mild asymmetry with decreased amplitudes seen over the right frontal region. There were frequent very brief bursts of polymorphic delta slowing seen moreso over the right hemisphere between 1-5 seconds. No epileptiform discharges were recorded. No clinical or electrographic seizures were recorded.   Hyperventilation and photic stimulation were not performed.  CLASSIFICATION (EEG IMPRESSION): Abnormal Significance II (awake, asleep) Intermittent slow, lateralized, right Asymmetry (mild), right frontal decreased Background slow  CLINICAL INTERPRETATION: 1) There is evidence of moderate focal cerebral dysfunction located over the right frontal region which is in keeping with patient's history of prior infarct located there. 2) There is also evidence of a diffuse mild to moderate encephalopathy which is non-specific and could be related to underlying neurocognitive disorders, multiple prior infarcts, or sedating medications. 3) No seizures were seen on this recording.  A routine EEG does not exclude a diagnosis of epilepsy.  Johnell Bas A. Marvel Plan, MD Neurology and Clinical Neurophysiology

## 2021-07-19 NOTE — Progress Notes (Signed)
At 0530, NIHSS attempted but patient would not cooperate. She would open her eyes and then fall back asleep.

## 2021-07-19 NOTE — Plan of Care (Signed)
°  Problem: Acute Rehab OT Goals (only OT should resolve) Goal: Pt. Will Perform Eating Flowsheets (Taken 07/19/2021 0905) Pt Will Perform Eating:  with adaptive utensils  with supervision  bed level Goal: Pt. Will Perform Grooming Flowsheets (Taken 07/19/2021 0905) Pt Will Perform Grooming:  sitting  with adaptive equipment  with supervision Goal: Pt. Will Perform Lower Body Bathing Flowsheets (Taken 07/19/2021 0905) Pt Will Perform Lower Body Bathing:  with mod assist  sitting/lateral leans  with adaptive equipment Goal: Pt. Will Perform Upper Body Dressing Flowsheets (Taken 07/19/2021 0905) Pt Will Perform Upper Body Dressing:  with set-up  sitting Goal: Pt. Will Perform Lower Body Dressing Flowsheets (Taken 07/19/2021 0905) Pt Will Perform Lower Body Dressing:  with mod assist  with adaptive equipment  sitting/lateral leans Goal: Pt. Will Transfer To Toilet [place] (Taken 07/19/2021 0905) Pt Will Transfer to Toilet:  with min assist  stand pivot transfer Goal: Pt/Caregiver Will Perform Home Exercise Program Flowsheets (Taken 07/19/2021 0905) Pt/caregiver will Perform Home Exercise Program:  Increased ROM  Increased strength  Right Upper extremity  Left upper extremity  With minimal assist  Joany Khatib OT, MOT

## 2021-07-20 DIAGNOSIS — F0280 Dementia in other diseases classified elsewhere without behavioral disturbance: Secondary | ICD-10-CM | POA: Diagnosis not present

## 2021-07-20 DIAGNOSIS — E1165 Type 2 diabetes mellitus with hyperglycemia: Secondary | ICD-10-CM | POA: Diagnosis not present

## 2021-07-20 DIAGNOSIS — G309 Alzheimer's disease, unspecified: Secondary | ICD-10-CM | POA: Diagnosis not present

## 2021-07-20 DIAGNOSIS — R279 Unspecified lack of coordination: Secondary | ICD-10-CM | POA: Diagnosis not present

## 2021-07-20 DIAGNOSIS — Z8673 Personal history of transient ischemic attack (TIA), and cerebral infarction without residual deficits: Secondary | ICD-10-CM | POA: Diagnosis not present

## 2021-07-20 DIAGNOSIS — G459 Transient cerebral ischemic attack, unspecified: Secondary | ICD-10-CM

## 2021-07-20 DIAGNOSIS — I10 Essential (primary) hypertension: Secondary | ICD-10-CM | POA: Diagnosis not present

## 2021-07-20 DIAGNOSIS — N390 Urinary tract infection, site not specified: Secondary | ICD-10-CM | POA: Diagnosis not present

## 2021-07-20 DIAGNOSIS — R69 Illness, unspecified: Secondary | ICD-10-CM | POA: Diagnosis not present

## 2021-07-20 DIAGNOSIS — Z7401 Bed confinement status: Secondary | ICD-10-CM | POA: Diagnosis not present

## 2021-07-20 LAB — GLUCOSE, CAPILLARY
Glucose-Capillary: 123 mg/dL — ABNORMAL HIGH (ref 70–99)
Glucose-Capillary: 118 mg/dL — ABNORMAL HIGH (ref 70–99)

## 2021-07-20 MED ORDER — PRAVASTATIN SODIUM 20 MG PO TABS: 20.0000 mg | ORAL_TABLET | Freq: Every day | ORAL | Status: DC

## 2021-07-20 MED ORDER — ASPIRIN EC 81 MG PO TBEC: 81.0000 mg | DELAYED_RELEASE_TABLET | Freq: Every day | ORAL | 11 refills | Status: DC

## 2021-07-20 MED ORDER — NITROFURANTOIN MONOHYD MACRO 100 MG PO CAPS: 100.0000 mg | ORAL_CAPSULE | Freq: Two times a day (BID) | ORAL | 0 refills | Status: DC

## 2021-07-20 MED ORDER — CLOPIDOGREL BISULFATE 75 MG PO TABS: 75.0000 mg | ORAL_TABLET | Freq: Every day | ORAL | Status: DC

## 2021-07-20 NOTE — Discharge Summary (Signed)
Physician Discharge Summary  Julia [name] TDV:[id number] DOB: Feb 10, 1928 DOA: 07/18/2021  PCP: Pcp, No  Admit date: 07/18/2021 Discharge date: 07/20/2021  Admitted From: Assisted living facility Disposition: Assisted living facility  Recommendations for Outpatient Follow-up:  Follow up with PCP in 1-2 weeks Please obtain BMP/CBC in one week Follow-up with neurology has been scheduled  Home Health: Home health PT, OT Equipment/Devices:   Discharge Condition: Stable CODE STATUS: DNR Diet recommendation: Heart healthy, carb modified  Brief/Interim Summary: 88 year old female who is a resident of a assisted living facility, with history of Alzheimer's dementia, hypertension and diabetes, admitted to the hospital with left-sided weakness and slurred speech.  Patient has had baseline left-sided weakness due to prior stroke.  She is admitted for further stroke work-up.  Seen by neurology.  MRI brain was unrevealing for acute stroke.  Echocardiogram also unrevealing.  EEG was performed that did not show any seizure activity.  There was concern that she may have had a TIA.  Recommended to continue on dual antiplatelet therapy for the next 3 weeks after which she can continue on Plavix alone.  She is continued on statin.  Overall neurodeficits are returned to baseline.  Urine indicated possible urinary tract infection.  She was started on a course of Macrodantin.  Urine culture currently in process at the time of discharge.  Seen by physical therapy with recommendations for home health PT.  Discharge Diagnoses:  Active Problems:   Hyperglycemia due to diabetes mellitus ([place])   Essential hypertension   Alzheimer's disease ([place])   TIA (transient ischemic attack)   Acute lower UTI    Discharge Instructions  Discharge Instructions     Ambulatory referral to Neurology   Complete by: As directed    Diet - low sodium heart healthy   Complete by: As directed    Increase activity slowly   Complete  by: As directed       Allergies as of 07/20/2021       Reactions   Sulfa Antibiotics Rash   Other reaction(s): Other (See Comments)        Medication List     TAKE these medications    acetaminophen 500 MG tablet Commonly known as: TYLENOL Take 500 mg by mouth 2 (two) times daily.   amLODipine 2.5 MG tablet Commonly known as: NORVASC Take 2.5 mg by mouth daily.   aspirin EC 81 MG tablet Take 1 tablet (81 mg total) by mouth daily. Swallow whole. Discontinue after 08/10/21 What changed: additional instructions   cetirizine 10 MG tablet Commonly known as: ZYRTEC Take 10 mg by mouth daily.   clopidogrel 75 MG tablet Commonly known as: PLAVIX Take 1 tablet (75 mg total) by mouth daily. Start taking on: July 21, 2021   glipiZIDE 5 MG tablet Commonly known as: GLUCOTROL Take 5 mg by mouth daily.   lisinopril 40 MG tablet Commonly known as: ZESTRIL Take 40 mg by mouth daily.   loperamide 2 MG tablet Commonly known as: IMODIUM A-D Take 2 mg by mouth 4 (four) times daily as needed for diarrhea or loose stools.   LORazepam 0.5 MG tablet Commonly known as: ATIVAN Take 0.5 mg by mouth daily as needed for anxiety.   metFORMIN 500 MG tablet Commonly known as: GLUCOPHAGE Take 500 mg by mouth 2 (two) times daily.   nitrofurantoin (macrocrystal-monohydrate) 100 MG capsule Commonly known as: MACROBID Take 1 capsule (100 mg total) by mouth every 12 (twelve) hours.   Nyamyc powder Generic drug: nystatin Apply  1 application topically in the morning and at bedtime.   ondansetron 4 MG disintegrating tablet Commonly known as: ZOFRAN-ODT Take 1 tablet by mouth every 8 (eight) hours as needed for nausea or vomiting.   pantoprazole 20 MG tablet Commonly known as: PROTONIX Take 20 mg by mouth daily.   pravastatin 20 MG tablet Commonly known as: PRAVACHOL Take 1 tablet (20 mg total) by mouth daily at 6 PM.   sertraline 50 MG tablet Commonly known as: ZOLOFT Take 50  mg by mouth daily.   Tums 500 MG chewable tablet Generic drug: calcium carbonate Chew 2 tablets by mouth 3 (three) times daily as needed for heartburn or indigestion.   Vitamin D (Ergocalciferol) 1.25 MG (50000 UNIT) Caps capsule Commonly known as: DRISDOL Take 50,000 Units by mouth once a week. Fridays        Follow-up Information     [facility] Follow up.   Why: PT will call to schudule your first visit.        Phillips Odor, MD. Schedule an appointment as soon as possible for a visit in 1 month(s).   Specialty: Neurology Contact information: [address] [place] [address] [phone]                Allergies  Allergen Reactions   Sulfa Antibiotics Rash    Other reaction(s): Other (See Comments)     Consultations: Neurology   Procedures/Studies: MR ANGIO HEAD WO CONTRAST  Result Date: 07/19/2021 CLINICAL DATA:  Weakness and confusion for 3 days, stroke suspected EXAM: MRI HEAD WITHOUT CONTRAST MRA HEAD WITHOUT CONTRAST MRA NECK WITHOUT AND WITH CONTRAST TECHNIQUE: Multiplanar, multi-echo pulse sequences of the brain and surrounding structures were acquired without intravenous contrast. Angiographic images of the Circle of Willis were acquired using MRA technique without intravenous contrast. Angiographic images of the neck were acquired using MRA technique without and with intravenous contrast. Carotid stenosis measurements (when applicable) are obtained utilizing NASCET criteria, using the distal internal carotid diameter as the denominator. CONTRAST:  47mL GADAVIST GADOBUTROL 1 MMOL/ML IV SOLN COMPARISON:  MRI head 08/15/2018, correlation is also made with CT head 07/18/2021. No prior MRA FINDINGS: MRI HEAD FINDINGS Brain: No restricted diffusion to suggest acute or subacute infarct. No acute hemorrhage, mass, mass effect, or midline shift. Area of susceptibility in the right frontal lobe may be related to a prior infarct. Focus of susceptibility in the  left basal ganglia and thalamus, likely related to prior hypertensive microhemorrhages. No hydrocephalus or extra-axial collection. Confluent T2 hyperintense signal in the periventricular white matter and pons, likely the sequela of moderate to severe chronic small vessel ischemic disease, which has progressed from the prior exam. Encephalomalacia in the medial right parietal lobe (series 13, image 38 and series 12, image 28), likely related to remote parietal cortical infarct, although this appears new compared to 2020. Vascular: Please see MRA findings below. Skull and upper cervical spine: Normal marrow signal. Sinuses/Orbits: No acute or significant finding. Status post bilateral lens replacements. Other: The mastoids are well aerated. MRA HEAD FINDINGS Anterior circulation: Both internal carotid arteries are patent to the termini, with mild irregularity but without significant stenosis. Hypoplastic right A1 with severe focal stenosis distally (series 1, image 101). Mild narrowing in a patent left A1. Normal anterior communicating artery. Multifocal narrowing in the left ACA, with severe stenosis in the A2 segment (series 1, image 109), and complete non opacification of the more distal left ACA (series 1, images 160-166) . Mild narrowing in  the proximal right A2 (series 1, image 107), with significantly diminished opacification of the distal right ACA (series 1, image 163) and mild multifocal narrowing in the distal ACA branches (series 1, image 163). Mild narrowing in the distal left M1 (series 100, image 102) and in the proximal inferior left M2 (series 1, image 99). Poor perfusion of additional branches of the inferior left M2, with intermittent opacification. Distal left MCA branches are irregular but otherwise perfused. Irregularity without focal stenosis in the right M1. Multifocal narrowing in the M2 and more distal right MCA branches, with focal poor opacification of some M3 branches (for example series  1, images 105-116). Posterior circulation: The left V4 segment and left PICA origin are patent. The right V4 is poorly opacified proximally and not opacified distally, with some retrograde flow in the most distal aspect of the right V4. A prominent right AICA is visualized. Basilar somewhat irregular but patent to its distal aspect. Superior cerebellar arteries patent proximally. Bilateral P1 segments originate from the basilar artery. The left posterior communicating artery is visualized, although there appears to be significant stenosis in the midportion of the vessel, with poor signal. PCAs with multifocal irregularity, as well as focal stenosis in the left distal P2 (series 1, image 84) and proximal P3 segment (series 1, images 92-96). Poor visualization of the P3s bilaterally. Anatomic variants: None significant MRA NECK FINDINGS Aortic arch: Three-vessel arch without significant stenosis. No dissection or aneurysm. Right carotid system: Mild narrowing at the bifurcation, without hemodynamically significant stenosis. No evidence of occlusion or aneurysm. Left carotid system: Mild narrowing at the bifurcation without hemodynamically significant stenosis. No evidence of occlusion or aneurysm. Vertebral arteries: Normal left vertebral artery, which is patent from its origin to the vertebrobasilar junction, without significant stenosis, occlusion, or aneurysm. Multifocal narrowing of the right V1, which appears patent, with intermittent non opacification of the right V2 (for example series 17, images 46-48). Intermittent opacification is also noted in the V3 and V4 segments. Other: None IMPRESSION: 1. No acute infarct. Sequela of chronic small vessel disease, with a remote right parietal cortical infarct that is new compared to 2020. 2. Intermittent non opacification of the right V2 and V3 segments, with non opacification of the majority of the right V4 segment, likely secondary to multifocal atherosclerotic  narrowing. No other hemodynamically significant stenosis in the neck. 3. No other intracranial proximal large vessel occlusion, however there is multifocal intracranial atherosclerotic narrowing, with focal non opacification of the distal ACAs. Additional severe stenosis or poor perfusion is noted in the distal right A1, proximal left A2, left M2, distal left P2, bilateral distal MCAs and PCAs, and left posterior communicating artery. Electronically Signed   By: Merilyn Baba M.D.   On: 07/19/2021 13:27   MR ANGIO NECK W WO CONTRAST  Result Date: 07/19/2021 CLINICAL DATA:  Weakness and confusion for 3 days, stroke suspected EXAM: MRI HEAD WITHOUT CONTRAST MRA HEAD WITHOUT CONTRAST MRA NECK WITHOUT AND WITH CONTRAST TECHNIQUE: Multiplanar, multi-echo pulse sequences of the brain and surrounding structures were acquired without intravenous contrast. Angiographic images of the Circle of Willis were acquired using MRA technique without intravenous contrast. Angiographic images of the neck were acquired using MRA technique without and with intravenous contrast. Carotid stenosis measurements (when applicable) are obtained utilizing NASCET criteria, using the distal internal carotid diameter as the denominator. CONTRAST:  41mL GADAVIST GADOBUTROL 1 MMOL/ML IV SOLN COMPARISON:  MRI head 08/15/2018, correlation is also made with CT head 07/18/2021. No prior MRA  FINDINGS: MRI HEAD FINDINGS Brain: No restricted diffusion to suggest acute or subacute infarct. No acute hemorrhage, mass, mass effect, or midline shift. Area of susceptibility in the right frontal lobe may be related to a prior infarct. Focus of susceptibility in the left basal ganglia and thalamus, likely related to prior hypertensive microhemorrhages. No hydrocephalus or extra-axial collection. Confluent T2 hyperintense signal in the periventricular white matter and pons, likely the sequela of moderate to severe chronic small vessel ischemic disease, which has  progressed from the prior exam. Encephalomalacia in the medial right parietal lobe (series 13, image 38 and series 12, image 28), likely related to remote parietal cortical infarct, although this appears new compared to 2020. Vascular: Please see MRA findings below. Skull and upper cervical spine: Normal marrow signal. Sinuses/Orbits: No acute or significant finding. Status post bilateral lens replacements. Other: The mastoids are well aerated. MRA HEAD FINDINGS Anterior circulation: Both internal carotid arteries are patent to the termini, with mild irregularity but without significant stenosis. Hypoplastic right A1 with severe focal stenosis distally (series 1, image 101). Mild narrowing in a patent left A1. Normal anterior communicating artery. Multifocal narrowing in the left ACA, with severe stenosis in the A2 segment (series 1, image 109), and complete non opacification of the more distal left ACA (series 1, images 160-166) . Mild narrowing in the proximal right A2 (series 1, image 107), with significantly diminished opacification of the distal right ACA (series 1, image 163) and mild multifocal narrowing in the distal ACA branches (series 1, image 163). Mild narrowing in the distal left M1 (series 100, image 102) and in the proximal inferior left M2 (series 1, image 99). Poor perfusion of additional branches of the inferior left M2, with intermittent opacification. Distal left MCA branches are irregular but otherwise perfused. Irregularity without focal stenosis in the right M1. Multifocal narrowing in the M2 and more distal right MCA branches, with focal poor opacification of some M3 branches (for example series 1, images 105-116). Posterior circulation: The left V4 segment and left PICA origin are patent. The right V4 is poorly opacified proximally and not opacified distally, with some retrograde flow in the most distal aspect of the right V4. A prominent right AICA is visualized. Basilar somewhat irregular  but patent to its distal aspect. Superior cerebellar arteries patent proximally. Bilateral P1 segments originate from the basilar artery. The left posterior communicating artery is visualized, although there appears to be significant stenosis in the midportion of the vessel, with poor signal. PCAs with multifocal irregularity, as well as focal stenosis in the left distal P2 (series 1, image 84) and proximal P3 segment (series 1, images 92-96). Poor visualization of the P3s bilaterally. Anatomic variants: None significant MRA NECK FINDINGS Aortic arch: Three-vessel arch without significant stenosis. No dissection or aneurysm. Right carotid system: Mild narrowing at the bifurcation, without hemodynamically significant stenosis. No evidence of occlusion or aneurysm. Left carotid system: Mild narrowing at the bifurcation without hemodynamically significant stenosis. No evidence of occlusion or aneurysm. Vertebral arteries: Normal left vertebral artery, which is patent from its origin to the vertebrobasilar junction, without significant stenosis, occlusion, or aneurysm. Multifocal narrowing of the right V1, which appears patent, with intermittent non opacification of the right V2 (for example series 17, images 46-48). Intermittent opacification is also noted in the V3 and V4 segments. Other: None IMPRESSION: 1. No acute infarct. Sequela of chronic small vessel disease, with a remote right parietal cortical infarct that is new compared to 2020. 2. Intermittent non  opacification of the right V2 and V3 segments, with non opacification of the majority of the right V4 segment, likely secondary to multifocal atherosclerotic narrowing. No other hemodynamically significant stenosis in the neck. 3. No other intracranial proximal large vessel occlusion, however there is multifocal intracranial atherosclerotic narrowing, with focal non opacification of the distal ACAs. Additional severe stenosis or poor perfusion is noted in the  distal right A1, proximal left A2, left M2, distal left P2, bilateral distal MCAs and PCAs, and left posterior communicating artery. Electronically Signed   By: Merilyn Baba M.D.   On: 07/19/2021 13:27   MR BRAIN WO CONTRAST  Result Date: 07/19/2021 CLINICAL DATA:  Weakness and confusion for 3 days, stroke suspected EXAM: MRI HEAD WITHOUT CONTRAST MRA HEAD WITHOUT CONTRAST MRA NECK WITHOUT AND WITH CONTRAST TECHNIQUE: Multiplanar, multi-echo pulse sequences of the brain and surrounding structures were acquired without intravenous contrast. Angiographic images of the Circle of Willis were acquired using MRA technique without intravenous contrast. Angiographic images of the neck were acquired using MRA technique without and with intravenous contrast. Carotid stenosis measurements (when applicable) are obtained utilizing NASCET criteria, using the distal internal carotid diameter as the denominator. CONTRAST:  56mL GADAVIST GADOBUTROL 1 MMOL/ML IV SOLN COMPARISON:  MRI head 08/15/2018, correlation is also made with CT head 07/18/2021. No prior MRA FINDINGS: MRI HEAD FINDINGS Brain: No restricted diffusion to suggest acute or subacute infarct. No acute hemorrhage, mass, mass effect, or midline shift. Area of susceptibility in the right frontal lobe may be related to a prior infarct. Focus of susceptibility in the left basal ganglia and thalamus, likely related to prior hypertensive microhemorrhages. No hydrocephalus or extra-axial collection. Confluent T2 hyperintense signal in the periventricular white matter and pons, likely the sequela of moderate to severe chronic small vessel ischemic disease, which has progressed from the prior exam. Encephalomalacia in the medial right parietal lobe (series 13, image 38 and series 12, image 28), likely related to remote parietal cortical infarct, although this appears new compared to 2020. Vascular: Please see MRA findings below. Skull and upper cervical spine: Normal marrow  signal. Sinuses/Orbits: No acute or significant finding. Status post bilateral lens replacements. Other: The mastoids are well aerated. MRA HEAD FINDINGS Anterior circulation: Both internal carotid arteries are patent to the termini, with mild irregularity but without significant stenosis. Hypoplastic right A1 with severe focal stenosis distally (series 1, image 101). Mild narrowing in a patent left A1. Normal anterior communicating artery. Multifocal narrowing in the left ACA, with severe stenosis in the A2 segment (series 1, image 109), and complete non opacification of the more distal left ACA (series 1, images 160-166) . Mild narrowing in the proximal right A2 (series 1, image 107), with significantly diminished opacification of the distal right ACA (series 1, image 163) and mild multifocal narrowing in the distal ACA branches (series 1, image 163). Mild narrowing in the distal left M1 (series 100, image 102) and in the proximal inferior left M2 (series 1, image 99). Poor perfusion of additional branches of the inferior left M2, with intermittent opacification. Distal left MCA branches are irregular but otherwise perfused. Irregularity without focal stenosis in the right M1. Multifocal narrowing in the M2 and more distal right MCA branches, with focal poor opacification of some M3 branches (for example series 1, images 105-116). Posterior circulation: The left V4 segment and left PICA origin are patent. The right V4 is poorly opacified proximally and not opacified distally, with some retrograde flow in the most distal  aspect of the right V4. A prominent right AICA is visualized. Basilar somewhat irregular but patent to its distal aspect. Superior cerebellar arteries patent proximally. Bilateral P1 segments originate from the basilar artery. The left posterior communicating artery is visualized, although there appears to be significant stenosis in the midportion of the vessel, with poor signal. PCAs with  multifocal irregularity, as well as focal stenosis in the left distal P2 (series 1, image 84) and proximal P3 segment (series 1, images 92-96). Poor visualization of the P3s bilaterally. Anatomic variants: None significant MRA NECK FINDINGS Aortic arch: Three-vessel arch without significant stenosis. No dissection or aneurysm. Right carotid system: Mild narrowing at the bifurcation, without hemodynamically significant stenosis. No evidence of occlusion or aneurysm. Left carotid system: Mild narrowing at the bifurcation without hemodynamically significant stenosis. No evidence of occlusion or aneurysm. Vertebral arteries: Normal left vertebral artery, which is patent from its origin to the vertebrobasilar junction, without significant stenosis, occlusion, or aneurysm. Multifocal narrowing of the right V1, which appears patent, with intermittent non opacification of the right V2 (for example series 17, images 46-48). Intermittent opacification is also noted in the V3 and V4 segments. Other: None IMPRESSION: 1. No acute infarct. Sequela of chronic small vessel disease, with a remote right parietal cortical infarct that is new compared to 2020. 2. Intermittent non opacification of the right V2 and V3 segments, with non opacification of the majority of the right V4 segment, likely secondary to multifocal atherosclerotic narrowing. No other hemodynamically significant stenosis in the neck. 3. No other intracranial proximal large vessel occlusion, however there is multifocal intracranial atherosclerotic narrowing, with focal non opacification of the distal ACAs. Additional severe stenosis or poor perfusion is noted in the distal right A1, proximal left A2, left M2, distal left P2, bilateral distal MCAs and PCAs, and left posterior communicating artery. Electronically Signed   By: Merilyn Baba M.D.   On: 07/19/2021 13:27   EEG adult  Result Date: 07/19/2021 Jaynie Bream, MD     07/19/2021  9:36 PM ROUTINE EEG  REPORT DATE OF STUDY:  07/19/21 from 16:55 to 17:35 HISTORY: 88yo woman with prior stroke with residual left sided weakness with episode of left sided weakness and slurred speech. DESCRIPTION: During maximal wakefulness, the background was organized and continuous but mildly slow.  There was a posterior dominant rhythm seen up to 6 Hz. Spontaneous variability and reactivity to stimulation were present. Stage I (N1) sleep was recorded, with alpha dropout, slow roving eye movements, high-voltage centrally predominant vertex waves, and positive occipital sharp transients of sleep (POSTS). Stage II (N2) sleep was recorded, with bilateral and symmetric K complexes, and sleep spindles at 12-16 Hz. There was a mild asymmetry with decreased amplitudes seen over the right frontal region. There were frequent very brief bursts of polymorphic delta slowing seen moreso over the right hemisphere between 1-5 seconds. No epileptiform discharges were recorded. No clinical or electrographic seizures were recorded.  Hyperventilation and photic stimulation were not performed. CLASSIFICATION (EEG IMPRESSION): Abnormal Significance II (awake, asleep) Intermittent slow, lateralized, right Asymmetry (mild), right frontal decreased Background slow CLINICAL INTERPRETATION: 1) There is evidence of moderate focal cerebral dysfunction located over the right frontal region which is in keeping with patient's history of prior infarct located there. 2) There is also evidence of a diffuse mild to moderate encephalopathy which is non-specific and could be related to underlying neurocognitive disorders, multiple prior infarcts, or sedating medications. 3) No seizures were seen on this recording.  A routine  EEG does not exclude a diagnosis of epilepsy. Colby A. Marvel Plan, MD Neurology and Clinical Neurophysiology  ECHOCARDIOGRAM COMPLETE  Result Date: 07/19/2021    ECHOCARDIOGRAM REPORT   Patient Name:   [facility] Date of Exam: 07/19/2021 Medical  Rec #:  [id number]   Height:       66.0 in Accession #:    [id number]  Weight:       164.9 lb Date of Birth:  01/08/1928   BSA:          1.842 m Patient Age:    32 years    BP:           155/58 mmHg Patient Gender: F           HR:           72 bpm. Exam Location:  [facility] Procedure: 2D Echo, Cardiac Doppler and Color Doppler Indications:    Stroke  History:        Patient has no prior history of Echocardiogram examinations.                 Stroke, Signs/Symptoms:Alzheimer's; Risk Factors:Hypertension                 and Diabetes.  Sonographer:    Wenda Low Referring Phys: [id number] OLADAPO ADEFESO IMPRESSIONS  1. Left ventricular ejection fraction, by estimation, is 60 to 65%. The left ventricle has normal function. The left ventricle has no regional wall motion abnormalities. There is mild left ventricular hypertrophy. Left ventricular diastolic parameters are consistent with Grade I diastolic dysfunction (impaired relaxation).  2. Right ventricular systolic function is normal. The right ventricular size is normal. There is mildly elevated pulmonary artery systolic pressure. The estimated right ventricular systolic pressure is 30.8 mmHg.  3. The mitral valve is normal in structure. Trivial mitral valve regurgitation. No evidence of mitral stenosis.  4. The aortic valve is tricuspid. Aortic valve regurgitation is not visualized. No aortic stenosis is present.  5. The inferior vena cava is normal in size with greater than 50% respiratory variability, suggesting right atrial pressure of 3 mmHg. FINDINGS  Left Ventricle: Left ventricular ejection fraction, by estimation, is 60 to 65%. The left ventricle has normal function. The left ventricle has no regional wall motion abnormalities. The left ventricular internal cavity size was normal in size. There is  mild left ventricular hypertrophy. Left ventricular diastolic parameters are consistent with Grade I diastolic dysfunction (impaired relaxation). Right  Ventricle: The right ventricular size is normal. No increase in right ventricular wall thickness. Right ventricular systolic function is normal. There is mildly elevated pulmonary artery systolic pressure. The tricuspid regurgitant velocity is 2.88  m/s, and with an assumed right atrial pressure of 3 mmHg, the estimated right ventricular systolic pressure is 65.7 mmHg. Left Atrium: Left atrial size was normal in size. Right Atrium: Right atrial size was normal in size. Pericardium: There is no evidence of pericardial effusion. Presence of epicardial fat layer. Mitral Valve: The mitral valve is normal in structure. Trivial mitral valve regurgitation. No evidence of mitral valve stenosis. MV peak gradient, 8.6 mmHg. The mean mitral valve gradient is 2.0 mmHg. Tricuspid Valve: The tricuspid valve is normal in structure. Tricuspid valve regurgitation is trivial. Aortic Valve: The aortic valve is tricuspid. Aortic valve regurgitation is not visualized. No aortic stenosis is present. Aortic valve mean gradient measures 4.0 mmHg. Aortic valve peak gradient measures 7.4 mmHg. Aortic valve area, by VTI measures 2.83 cm. Pulmonic Valve: The pulmonic  valve was not well visualized. Pulmonic valve regurgitation is not visualized. Aorta: The aortic root is normal in size and structure. Venous: The inferior vena cava is normal in size with greater than 50% respiratory variability, suggesting right atrial pressure of 3 mmHg. IAS/Shunts: The interatrial septum was not well visualized.  LEFT VENTRICLE PLAX 2D LVIDd:         4.20 cm     Diastology LVIDs:         3.10 cm     LV e' medial:    5.55 cm/s LV PW:         1.20 cm     LV E/e' medial:  12.8 LV IVS:        1.30 cm     LV e' lateral:   5.00 cm/s LVOT diam:     2.00 cm     LV E/e' lateral: 14.2 LV SV:         83 LV SV Index:   45 LVOT Area:     3.14 cm  LV Volumes (MOD) LV vol d, MOD A2C: 45.8 ml LV vol d, MOD A4C: 47.6 ml LV vol s, MOD A2C: 22.2 ml LV vol s, MOD A4C: 16.3 ml  LV SV MOD A2C:     23.6 ml LV SV MOD A4C:     47.6 ml LV SV MOD BP:      28.6 ml RIGHT VENTRICLE RV Basal diam:  3.00 cm RV Mid diam:    2.10 cm RV S prime:     18.60 cm/s TAPSE (M-mode): 2.3 cm LEFT ATRIUM             Index        RIGHT ATRIUM           Index LA diam:        3.90 cm 2.12 cm/m   RA Area:     15.40 cm LA Vol (A2C):   44.6 ml 24.21 ml/m  RA Volume:   36.30 ml  19.70 ml/m LA Vol (A4C):   38.4 ml 20.84 ml/m LA Biplane Vol: 41.8 ml 22.69 ml/m  AORTIC VALVE                    PULMONIC VALVE AV Area (Vmax):    2.84 cm     PV Vmax:       0.92 m/s AV Area (Vmean):   2.86 cm     PV Peak grad:  3.4 mmHg AV Area (VTI):     2.83 cm AV Vmax:           136.00 cm/s AV Vmean:          85.300 cm/s AV VTI:            0.294 m AV Peak Grad:      7.4 mmHg AV Mean Grad:      4.0 mmHg LVOT Vmax:         123.00 cm/s LVOT Vmean:        77.700 cm/s LVOT VTI:          0.265 m LVOT/AV VTI ratio: 0.90  AORTA Ao Root diam: 2.90 cm MITRAL VALVE                TRICUSPID VALVE MV Area (PHT): 2.97 cm     TR Peak grad:   33.2 mmHg MV Area VTI:   2.80 cm     TR Vmax:  288.00 cm/s MV Peak grad:  8.6 mmHg MV Mean grad:  2.0 mmHg     SHUNTS MV Vmax:       1.47 m/s     Systemic VTI:  0.26 m MV Vmean:      65.1 cm/s    Systemic Diam: 2.00 cm MV Decel Time: 255 msec MV E velocity: 71.10 cm/s MV A velocity: 113.00 cm/s MV E/A ratio:  0.63 Oswaldo Milian MD Electronically signed by Oswaldo Milian MD Signature Date/Time: 07/19/2021/3:42:13 PM    Final    CT HEAD CODE STROKE WO CONTRAST  Result Date: 07/18/2021 CLINICAL DATA:  Code stroke. EXAM: CT HEAD WITHOUT CONTRAST TECHNIQUE: Contiguous axial images were obtained from the base of the skull through the vertex without intravenous contrast. RADIATION DOSE REDUCTION: This exam was performed according to the departmental dose-optimization program which includes automated exposure control, adjustment of the mA and/or kV according to patient size and/or use of  iterative reconstruction technique. COMPARISON:  Prior CT from 11/13/2020. FINDINGS: Brain: Diffuse prominence of the CSF containing spaces compatible generalized cerebral atrophy. Patchy and confluent hypodensity involving the supratentorial cerebral white matter most consistent with chronic small vessel ischemic disease, moderate in nature. Few scatter remote lacunar infarcts present about the deep gray nuclei. No acute intracranial hemorrhage. Hypodensity involving the parasagittal right frontoparietal region adjacent to the falx consistent with a right ACA territory ischemic infarct, age indeterminate. Upon review of prior CT from 11/13/2020, there is suggestion of a small acute ischemic infarct at this location on prior head CT, although the area involved on today's exam appears larger, and a superimposed acute component would be difficult to exclude. No other visible large vessel territory infarct. No mass lesion, mass effect, or midline shift. No hydrocephalus or extra-axial fluid collection. Vascular: No visible hyperdense vessel. Calcified atherosclerosis present at the skull base. Skull: Scalp soft tissues and calvarium within normal limits. Sinuses/Orbits: Globes and orbital soft tissues demonstrate no acute finding. Visualized paranasal sinuses and mastoid air cells are clear. Other: None. ASPECTS ([place] Stroke Program Early CT Score) - Ganglionic level infarction (caudate, lentiform nuclei, internal capsule, insula, M1-M3 cortex): 7 - Supraganglionic infarction (M4-M6 cortex): 3 Total score (0-10 with 10 being normal): 10 IMPRESSION: 1. Age-indeterminate posterior right ACA territory infarct involving the parasagittal right frontoparietal region. While this could be chronic in nature, a possible acute to subacute component is difficult to exclude, and could be considered in the correct clinical setting. No intracranial hemorrhage. 2. ASPECTS is 10. 3. Underlying age-related cerebral atrophy with  moderate chronic microvascular ischemic disease. Critical Value/emergent results were called by telephone at the time of interpretation on 07/18/2021 at 10:51 pm to provider Godfrey Pick , who verbally acknowledged these results. Electronically Signed   By: Jeannine Boga M.D.   On: 07/18/2021 22:58      Subjective: Patient is seen sitting up in chair.  She is pleasantly confused.  Discharge Exam: Vitals:   07/19/21 0857 07/19/21 1247 07/19/21 2112 07/20/21 0418  BP: (!) 158/69 (!) 169/70 (!) 145/95 (!) 164/72  Pulse: 74 76 73 68  Resp: 18 18 19 19   Temp: 98.6 F (37 C) 98.5 F (36.9 C) 98.9 F (37.2 C) 98 F (36.7 C)  TempSrc: Oral Oral    SpO2: 100% 100% 99% 97%  Weight:      Height:        General: Pt is alert, awake, not in acute distress Cardiovascular: RRR, S1/S2 +, no rubs, no gallops Respiratory: CTA  bilaterally, no wheezing, no rhonchi Abdominal: Soft, NT, ND, bowel sounds + Extremities: no edema, no cyanosis    The results of significant diagnostics from this hospitalization (including imaging, microbiology, ancillary and laboratory) are listed below for reference.     Microbiology: Recent Results (from the past 240 hour(s))  Resp Panel by RT-PCR (Flu A&B, Covid) Nasopharyngeal Swab     Status: None   Collection Time: 07/18/21 10:44 PM   Specimen: Nasopharyngeal Swab; Nasopharyngeal(NP) swabs in vial transport medium  Result Value Ref Range Status   SARS Coronavirus 2 by RT PCR NEGATIVE NEGATIVE Final    Comment: (NOTE) SARS-CoV-2 target nucleic acids are NOT DETECTED.  The SARS-CoV-2 RNA is generally detectable in upper respiratory specimens during the acute phase of infection. The lowest concentration of SARS-CoV-2 viral copies this assay can detect is 138 copies/mL. A negative result does not preclude SARS-Cov-2 infection and should not be used as the sole basis for treatment or other patient management decisions. A negative result may occur with   improper specimen collection/handling, submission of specimen other than nasopharyngeal swab, presence of viral mutation(s) within the areas targeted by this assay, and inadequate number of viral copies(<138 copies/mL). A negative result must be combined with clinical observations, patient history, and epidemiological information. The expected result is Negative.  Fact Sheet for Patients:  EntrepreneurPulse.com.au  Fact Sheet for Healthcare Providers:  IncredibleEmployment.be  This test is no t yet approved or cleared by the [place] FDA and  has been authorized for detection and/or diagnosis of SARS-CoV-2 by FDA under an Emergency Use Authorization (EUA). This EUA will remain  in effect (meaning this test can be used) for the duration of the COVID-19 declaration under Section 564(b)(1) of the Act, 21 U.S.C.section 360bbb-3(b)(1), unless the authorization is terminated  or revoked sooner.       Influenza A by PCR NEGATIVE NEGATIVE Final   Influenza B by PCR NEGATIVE NEGATIVE Final    Comment: (NOTE) The Xpert Xpress SARS-CoV-2/FLU/RSV plus assay is intended as an aid in the diagnosis of influenza from Nasopharyngeal swab specimens and should not be used as a sole basis for treatment. Nasal washings and aspirates are unacceptable for Xpert Xpress SARS-CoV-2/FLU/RSV testing.  Fact Sheet for Patients: EntrepreneurPulse.com.au  Fact Sheet for Healthcare Providers: IncredibleEmployment.be  This test is not yet approved or cleared by the [place] FDA and has been authorized for detection and/or diagnosis of SARS-CoV-2 by FDA under an Emergency Use Authorization (EUA). This EUA will remain in effect (meaning this test can be used) for the duration of the COVID-19 declaration under Section 564(b)(1) of the Act, 21 U.S.C. section 360bbb-3(b)(1), unless the authorization is terminated  or revoked.  Performed at [facility], [address] [place], [place] [address]      Labs: BNP (last 3 results) No results for input(s): BNP in the last 8760 hours. Basic Metabolic Panel: Recent Labs  Lab 07/18/21 2329 07/18/21 2334 07/19/21 0711  NA 138 140 140  K 4.1 4.1 4.1  CL 104 104 108  CO2 25  --  23  GLUCOSE 160* 156* 139*  BUN 25* 24* 24*  CREATININE 1.12* 1.20* 1.09*  CALCIUM 9.5  --  9.0  MG  --   --  1.7  PHOS  --   --  3.2   Liver Function Tests: Recent Labs  Lab 07/18/21 2329 07/19/21 0711  AST 20 17  ALT 18 15  ALKPHOS 68 54  BILITOT 0.4 0.4  PROT  7.1 5.7*  ALBUMIN 4.0 3.2*   No results for input(s): LIPASE, AMYLASE in the last 168 hours. No results for input(s): AMMONIA in the last 168 hours. CBC: Recent Labs  Lab 07/18/21 2329 07/18/21 2334 07/19/21 0711  WBC 7.1  --  6.7  NEUTROABS 4.6  --   --   HGB 11.5* 11.6* 9.6*  HCT 35.3* 34.0* 30.6*  MCV 88.0  --  88.4  PLT 221  --  183   Cardiac Enzymes: No results for input(s): CKTOTAL, CKMB, CKMBINDEX, TROPONINI in the last 168 hours. BNP: Invalid input(s): POCBNP CBG: Recent Labs  Lab 07/19/21 1135 07/19/21 1611 07/19/21 2112 07/20/21 0712 07/20/21 1138  GLUCAP 122* 97 99 123* 118*   D-Dimer No results for input(s): DDIMER in the last 72 hours. Hgb A1c Recent Labs    07/19/21 0712  HGBA1C 5.7*   Lipid Profile Recent Labs    07/19/21 0711  CHOL 144  HDL 26*  LDLCALC 93  TRIG 124  CHOLHDL 5.5   Thyroid function studies No results for input(s): TSH, T4TOTAL, T3FREE, THYROIDAB in the last 72 hours.  Invalid input(s): FREET3 Anemia work up No results for input(s): VITAMINB12, FOLATE, FERRITIN, TIBC, IRON, RETICCTPCT in the last 72 hours. Urinalysis    Component Value Date/Time   COLORURINE YELLOW 07/19/2021 1205   APPEARANCEUR CLEAR 07/19/2021 1205   LABSPEC >1.030 (H) 07/19/2021 1205   PHURINE 5.5 07/19/2021 1205   GLUCOSEU NEGATIVE 07/19/2021 1205   HGBUR  NEGATIVE 07/19/2021 1205   [place] 07/19/2021 1205   KETONESUR NEGATIVE 07/19/2021 1205   PROTEINUR NEGATIVE 07/19/2021 1205   NITRITE POSITIVE (A) 07/19/2021 1205   LEUKOCYTESUR SMALL (A) 07/19/2021 1205   Sepsis Labs Invalid input(s): PROCALCITONIN,  WBC,  LACTICIDVEN Microbiology Recent Results (from the past 240 hour(s))  Resp Panel by RT-PCR (Flu A&B, Covid) Nasopharyngeal Swab     Status: None   Collection Time: 07/18/21 10:44 PM   Specimen: Nasopharyngeal Swab; Nasopharyngeal(NP) swabs in vial transport medium  Result Value Ref Range Status   SARS Coronavirus 2 by RT PCR NEGATIVE NEGATIVE Final    Comment: (NOTE) SARS-CoV-2 target nucleic acids are NOT DETECTED.  The SARS-CoV-2 RNA is generally detectable in upper respiratory specimens during the acute phase of infection. The lowest concentration of SARS-CoV-2 viral copies this assay can detect is 138 copies/mL. A negative result does not preclude SARS-Cov-2 infection and should not be used as the sole basis for treatment or other patient management decisions. A negative result may occur with  improper specimen collection/handling, submission of specimen other than nasopharyngeal swab, presence of viral mutation(s) within the areas targeted by this assay, and inadequate number of viral copies(<138 copies/mL). A negative result must be combined with clinical observations, patient history, and epidemiological information. The expected result is Negative.  Fact Sheet for Patients:  EntrepreneurPulse.com.au  Fact Sheet for Healthcare Providers:  IncredibleEmployment.be  This test is no t yet approved or cleared by the [place] FDA and  has been authorized for detection and/or diagnosis of SARS-CoV-2 by FDA under an Emergency Use Authorization (EUA). This EUA will remain  in effect (meaning this test can be used) for the duration of the COVID-19 declaration under Section  564(b)(1) of the Act, 21 U.S.C.section 360bbb-3(b)(1), unless the authorization is terminated  or revoked sooner.       Influenza A by PCR NEGATIVE NEGATIVE Final   Influenza B by PCR NEGATIVE NEGATIVE Final    Comment: (NOTE) The Xpert Xpress  SARS-CoV-2/FLU/RSV plus assay is intended as an aid in the diagnosis of influenza from Nasopharyngeal swab specimens and should not be used as a sole basis for treatment. Nasal washings and aspirates are unacceptable for Xpert Xpress SARS-CoV-2/FLU/RSV testing.  Fact Sheet for Patients: EntrepreneurPulse.com.au  Fact Sheet for Healthcare Providers: IncredibleEmployment.be  This test is not yet approved or cleared by the [place] FDA and has been authorized for detection and/or diagnosis of SARS-CoV-2 by FDA under an Emergency Use Authorization (EUA). This EUA will remain in effect (meaning this test can be used) for the duration of the COVID-19 declaration under Section 564(b)(1) of the Act, 21 U.S.C. section 360bbb-3(b)(1), unless the authorization is terminated or revoked.  Performed at [facility], [address], [place], [place] [address]      Time coordinating discharge: 12mins  SIGNED:   Kathie Dike, MD  [facility] 07/20/2021, 1:40 PM   If 7PM-7AM, please contact night-coverage [email or web address]

## 2021-07-20 NOTE — TOC Transition Note (Signed)
Transition of Care [facility]) - CM/SW Discharge Note   Patient Details  Name: Julia [name] MRN: [id number] Date of Birth: [birth year]  [facility] CM/SW Contact:  Boneta Lucks, RN Phone Number: 07/20/2021, 2:59 PM   Clinical Narrative:   Patient discharging back to [facility] ALF. FL2 faxed and RN called report. EMS scheduled, Nurse left daughter a message. TOC unable to reach family. Medical necessity printed.   Final next level of care: Assisted Living Barriers to Discharge: Barriers Resolved   Patient Goals and CMS Choice Patient states their goals for this hospitalization and ongoing recovery are:: to return to ALF. CMS Medicare.gov Compare Post Acute Care list provided to:: Patient Choice offered to / list presented to : Patient  Discharge Placement           Patient to be transferred to facility by: EMS Name of family member notified: Left message for daughter Patient and family notified of of transfer: 07/20/21  Discharge Plan and Services    HH Arranged: PT [facility] Agency: [place] Date [place]: 07/19/21 Time [place]: 6438 Representative spoke with at [place]: Judson Roch

## 2021-07-20 NOTE — NC FL2 (Signed)
[place] MEDICAID FL2 LEVEL OF CARE SCREENING TOOL     IDENTIFICATION  Patient Name: Julia [name] Birthdate: June 10, 1928 Sex: female Admission Date (Current Location): 07/18/2021  [facility] and [place] Number:  [employer] and Address:  [place] [address], [place]      Provider Number: [id number]  Attending Physician Name and Address:  Kathie Dike, MD  Relative Name and Phone Number:  Kristin Barcus  Spouse - [phone]    Current Level of Care: Hospital Recommended Level of Care: Memory Care Prior Approval Number:    Date Approved/Denied:   PASRR Number:    Discharge Plan: Domiciliary (Rest home)    Current Diagnoses: Patient Active Problem List   Diagnosis Date Noted   TIA (transient ischemic attack) 07/20/2021   Acute lower UTI 07/20/2021   Hyperglycemia due to diabetes mellitus ([place]) 07/19/2021   Essential hypertension 07/19/2021   Alzheimer's disease ([place]) 07/19/2021    Orientation RESPIRATION BLADDER Height & Weight     Self, Place  Normal Incontinent Weight: 74.8 kg (pt. was not able to stand nurse was award of it and also the different in the weight I mention to) Height:  5\' 6"  (167.6 cm)  BEHAVIORAL SYMPTOMS/MOOD NEUROLOGICAL BOWEL NUTRITION STATUS      Incontinent Diet (carb controlled)  AMBULATORY STATUS COMMUNICATION OF NEEDS Skin   Extensive Assist Verbally Bruising                       Personal Care Assistance Level of Assistance  Bathing, Feeding, Dressing Bathing Assistance: Limited assistance Feeding assistance: Limited assistance Dressing Assistance: Limited assistance     Functional Limitations Info  Sight, Hearing, Speech Sight Info: Adequate Hearing Info: Impaired Speech Info: Adequate    SPECIAL CARE FACTORS FREQUENCY  PT (By licensed PT)     PT Frequency: 3 times a week              Contractures Contractures Info: Not present    Additional Factors Info  Code  Status, Allergies Code Status Info: DNR Allergies Info: Sulfa           Current Medications (07/20/2021):  This is the current hospital active medication list Current Facility-Administered Medications  Medication Dose Route Frequency Provider Last Rate Last Admin   acetaminophen (TYLENOL) tablet 650 mg  650 mg Oral Q4H PRN Adefeso, Oladapo, DO       Or   acetaminophen (TYLENOL) 160 MG/5ML solution 650 mg  650 mg Per Tube Q4H PRN Adefeso, Oladapo, DO       Or   acetaminophen (TYLENOL) suppository 650 mg  650 mg Rectal Q4H PRN Adefeso, Oladapo, DO       aspirin EC tablet 81 mg  81 mg Oral Daily Adefeso, Oladapo, DO   81 mg at 07/20/21 0800   clopidogrel (PLAVIX) tablet 75 mg  75 mg Oral Daily Rosalin Hawking, MD   75 mg at 07/20/21 0800   insulin aspart (novoLOG) injection 0-5 Units  0-5 Units Subcutaneous QHS Adefeso, Oladapo, DO       insulin aspart (novoLOG) injection 0-9 Units  0-9 Units Subcutaneous TID WC Adefeso, Oladapo, DO   1 Units at 07/20/21 0800   nitrofurantoin (macrocrystal-monohydrate) (MACROBID) capsule 100 mg  100 mg Oral Q12H Memon, Jolaine Artist, MD   100 mg at 07/20/21 0800   pantoprazole (PROTONIX) EC tablet 40 mg  40 mg Oral Daily Rosalin Hawking, MD   40 mg at 07/20/21 0800  pravastatin (PRAVACHOL) tablet 20 mg  20 mg Oral q1800 Rosalin Hawking, MD   20 mg at 07/19/21 1818   sertraline (ZOLOFT) tablet 50 mg  50 mg Oral Daily Rosalin Hawking, MD   50 mg at 07/20/21 0800   [START ON 07/21/2021] Vitamin D (Ergocalciferol) (DRISDOL) capsule 50,000 Units  50,000 Units Oral Weekly Rosalin Hawking, MD         Discharge Medications: Medication List       TAKE these medications     acetaminophen 500 MG tablet Commonly known as: TYLENOL Take 500 mg by mouth 2 (two) times daily.    amLODipine 2.5 MG tablet Commonly known as: NORVASC Take 2.5 mg by mouth daily.    aspirin EC 81 MG tablet Take 1 tablet (81 mg total) by mouth daily. Swallow whole. Discontinue after 08/10/21 What changed:  additional instructions    cetirizine 10 MG tablet Commonly known as: ZYRTEC Take 10 mg by mouth daily.    clopidogrel 75 MG tablet Commonly known as: PLAVIX Take 1 tablet (75 mg total) by mouth daily. Start taking on: July 21, 2021    glipiZIDE 5 MG tablet Commonly known as: GLUCOTROL Take 5 mg by mouth daily.    lisinopril 40 MG tablet Commonly known as: ZESTRIL Take 40 mg by mouth daily.    loperamide 2 MG tablet Commonly known as: IMODIUM A-D Take 2 mg by mouth 4 (four) times daily as needed for diarrhea or loose stools.    LORazepam 0.5 MG tablet Commonly known as: ATIVAN Take 0.5 mg by mouth daily as needed for anxiety.    metFORMIN 500 MG tablet Commonly known as: GLUCOPHAGE Take 500 mg by mouth 2 (two) times daily.    nitrofurantoin (macrocrystal-monohydrate) 100 MG capsule Commonly known as: MACROBID Take 1 capsule (100 mg total) by mouth every 12 (twelve) hours.    Nyamyc powder Generic drug: nystatin Apply 1 application topically in the morning and at bedtime.    ondansetron 4 MG disintegrating tablet Commonly known as: ZOFRAN-ODT Take 1 tablet by mouth every 8 (eight) hours as needed for nausea or vomiting.    pantoprazole 20 MG tablet Commonly known as: PROTONIX Take 20 mg by mouth daily.    pravastatin 20 MG tablet Commonly known as: PRAVACHOL Take 1 tablet (20 mg total) by mouth daily at 6 PM.    sertraline 50 MG tablet Commonly known as: ZOLOFT Take 50 mg by mouth daily.    Tums 500 MG chewable tablet Generic drug: calcium carbonate Chew 2 tablets by mouth 3 (three) times daily as needed for heartburn or indigestion.    Vitamin D (Ergocalciferol) 1.25 MG (50000 UNIT) Caps capsule Commonly known as: DRISDOL Take 50,000 Units by mouth once a week. Fridays        Relevant Imaging Results:  Relevant Lab Results:   Additional Information SS# [id number]  Boneta Lucks, RN

## 2021-07-20 NOTE — Progress Notes (Signed)
STROKE TEAM PROGRESS NOTE   SUBJECTIVE (INTERVAL HISTORY) Her RN is at the bedside, no family is at the bedside.  Overall her condition is unchanged. She is awake alert still only orientated to self. Left UE and LE mildly weaker than right but seems more brisk on movement than yesterday.    OBJECTIVE Temp:  [98 F (36.7 C)-98.9 F (37.2 C)] 98 F (36.7 C) (01/19 0418) Pulse Rate:  [68-76] 68 (01/19 0418) Cardiac Rhythm: Normal sinus rhythm (01/18 2032) Resp:  [18-19] 19 (01/19 0418) BP: (145-169)/(69-95) 164/72 (01/19 0418) SpO2:  [97 %-100 %] 97 % (01/19 0418)  Recent Labs  Lab 07/19/21 0823 07/19/21 1135 07/19/21 1611 07/19/21 2112 07/20/21 0712  GLUCAP 115* 122* 97 99 123*   Recent Labs  Lab 07/18/21 2329 07/18/21 2334 07/19/21 0711  NA 138 140 140  K 4.1 4.1 4.1  CL 104 104 108  CO2 25  --  23  GLUCOSE 160* 156* 139*  BUN 25* 24* 24*  CREATININE 1.12* 1.20* 1.09*  CALCIUM 9.5  --  9.0  MG  --   --  1.7  PHOS  --   --  3.2   Recent Labs  Lab 07/18/21 2329 07/19/21 0711  AST 20 17  ALT 18 15  ALKPHOS 68 54  BILITOT 0.4 0.4  PROT 7.1 5.7*  ALBUMIN 4.0 3.2*   Recent Labs  Lab 07/18/21 2329 07/18/21 2334 07/19/21 0711  WBC 7.1  --  6.7  NEUTROABS 4.6  --   --   HGB 11.5* 11.6* 9.6*  HCT 35.3* 34.0* 30.6*  MCV 88.0  --  88.4  PLT 221  --  183   No results for input(s): CKTOTAL, CKMB, CKMBINDEX, TROPONINI in the last 168 hours. Recent Labs    07/18/21 2329  LABPROT 12.5  INR 0.9   Recent Labs    07/19/21 1205  COLORURINE YELLOW  LABSPEC >1.030*  PHURINE 5.5  GLUCOSEU NEGATIVE  HGBUR NEGATIVE  BILIRUBINUR NEGATIVE  KETONESUR NEGATIVE  PROTEINUR NEGATIVE  NITRITE POSITIVE*  LEUKOCYTESUR SMALL*       Component Value Date/Time   CHOL 144 07/19/2021 0711   TRIG 124 07/19/2021 0711   HDL 26 (L) 07/19/2021 0711   CHOLHDL 5.5 07/19/2021 0711   VLDL 25 07/19/2021 0711   LDLCALC 93 07/19/2021 0711   Lab Results  Component Value Date    HGBA1C 5.7 (H) 07/19/2021   No results found for: LABOPIA, [place], [place], [place], [place], [place]  Recent Labs  Lab 07/18/21 [place] <10    I have personally reviewed the radiological images below and agree with the radiology interpretations.  MR ANGIO HEAD WO CONTRAST  Result Date: 07/19/2021 CLINICAL DATA:  Weakness and confusion for 3 days, stroke suspected EXAM: MRI HEAD WITHOUT CONTRAST MRA HEAD WITHOUT CONTRAST MRA NECK WITHOUT AND WITH CONTRAST TECHNIQUE: Multiplanar, multi-echo pulse sequences of the brain and surrounding structures were acquired without intravenous contrast. Angiographic images of the Circle of Willis were acquired using MRA technique without intravenous contrast. Angiographic images of the neck were acquired using MRA technique without and with intravenous contrast. Carotid stenosis measurements (when applicable) are obtained utilizing NASCET criteria, using the distal internal carotid diameter as the denominator. CONTRAST:  11mL GADAVIST GADOBUTROL 1 MMOL/ML IV SOLN COMPARISON:  MRI head 08/15/2018, correlation is also made with CT head 07/18/2021. No prior MRA FINDINGS: MRI HEAD FINDINGS Brain: No restricted diffusion to suggest acute or subacute infarct. No acute hemorrhage, mass, mass effect,  or midline shift. Area of susceptibility in the right frontal lobe may be related to a prior infarct. Focus of susceptibility in the left basal ganglia and thalamus, likely related to prior hypertensive microhemorrhages. No hydrocephalus or extra-axial collection. Confluent T2 hyperintense signal in the periventricular white matter and pons, likely the sequela of moderate to severe chronic small vessel ischemic disease, which has progressed from the prior exam. Encephalomalacia in the medial right parietal lobe (series 13, image 38 and series 12, image 28), likely related to remote parietal cortical infarct, although this appears new compared to 2020. Vascular: Please see  MRA findings below. Skull and upper cervical spine: Normal marrow signal. Sinuses/Orbits: No acute or significant finding. Status post bilateral lens replacements. Other: The mastoids are well aerated. MRA HEAD FINDINGS Anterior circulation: Both internal carotid arteries are patent to the termini, with mild irregularity but without significant stenosis. Hypoplastic right A1 with severe focal stenosis distally (series 1, image 101). Mild narrowing in a patent left A1. Normal anterior communicating artery. Multifocal narrowing in the left ACA, with severe stenosis in the A2 segment (series 1, image 109), and complete non opacification of the more distal left ACA (series 1, images 160-166) . Mild narrowing in the proximal right A2 (series 1, image 107), with significantly diminished opacification of the distal right ACA (series 1, image 163) and mild multifocal narrowing in the distal ACA branches (series 1, image 163). Mild narrowing in the distal left M1 (series 100, image 102) and in the proximal inferior left M2 (series 1, image 99). Poor perfusion of additional branches of the inferior left M2, with intermittent opacification. Distal left MCA branches are irregular but otherwise perfused. Irregularity without focal stenosis in the right M1. Multifocal narrowing in the M2 and more distal right MCA branches, with focal poor opacification of some M3 branches (for example series 1, images 105-116). Posterior circulation: The left V4 segment and left PICA origin are patent. The right V4 is poorly opacified proximally and not opacified distally, with some retrograde flow in the most distal aspect of the right V4. A prominent right AICA is visualized. Basilar somewhat irregular but patent to its distal aspect. Superior cerebellar arteries patent proximally. Bilateral P1 segments originate from the basilar artery. The left posterior communicating artery is visualized, although there appears to be significant stenosis in  the midportion of the vessel, with poor signal. PCAs with multifocal irregularity, as well as focal stenosis in the left distal P2 (series 1, image 84) and proximal P3 segment (series 1, images 92-96). Poor visualization of the P3s bilaterally. Anatomic variants: None significant MRA NECK FINDINGS Aortic arch: Three-vessel arch without significant stenosis. No dissection or aneurysm. Right carotid system: Mild narrowing at the bifurcation, without hemodynamically significant stenosis. No evidence of occlusion or aneurysm. Left carotid system: Mild narrowing at the bifurcation without hemodynamically significant stenosis. No evidence of occlusion or aneurysm. Vertebral arteries: Normal left vertebral artery, which is patent from its origin to the vertebrobasilar junction, without significant stenosis, occlusion, or aneurysm. Multifocal narrowing of the right V1, which appears patent, with intermittent non opacification of the right V2 (for example series 17, images 46-48). Intermittent opacification is also noted in the V3 and V4 segments. Other: None IMPRESSION: 1. No acute infarct. Sequela of chronic small vessel disease, with a remote right parietal cortical infarct that is new compared to 2020. 2. Intermittent non opacification of the right V2 and V3 segments, with non opacification of the majority of the right V4 segment, likely  secondary to multifocal atherosclerotic narrowing. No other hemodynamically significant stenosis in the neck. 3. No other intracranial proximal large vessel occlusion, however there is multifocal intracranial atherosclerotic narrowing, with focal non opacification of the distal ACAs. Additional severe stenosis or poor perfusion is noted in the distal right A1, proximal left A2, left M2, distal left P2, bilateral distal MCAs and PCAs, and left posterior communicating artery. Electronically Signed   By: Merilyn Baba M.D.   On: 07/19/2021 13:27   MR ANGIO NECK W WO CONTRAST  Result  Date: 07/19/2021 CLINICAL DATA:  Weakness and confusion for 3 days, stroke suspected EXAM: MRI HEAD WITHOUT CONTRAST MRA HEAD WITHOUT CONTRAST MRA NECK WITHOUT AND WITH CONTRAST TECHNIQUE: Multiplanar, multi-echo pulse sequences of the brain and surrounding structures were acquired without intravenous contrast. Angiographic images of the Circle of Willis were acquired using MRA technique without intravenous contrast. Angiographic images of the neck were acquired using MRA technique without and with intravenous contrast. Carotid stenosis measurements (when applicable) are obtained utilizing NASCET criteria, using the distal internal carotid diameter as the denominator. CONTRAST:  58mL GADAVIST GADOBUTROL 1 MMOL/ML IV SOLN COMPARISON:  MRI head 08/15/2018, correlation is also made with CT head 07/18/2021. No prior MRA FINDINGS: MRI HEAD FINDINGS Brain: No restricted diffusion to suggest acute or subacute infarct. No acute hemorrhage, mass, mass effect, or midline shift. Area of susceptibility in the right frontal lobe may be related to a prior infarct. Focus of susceptibility in the left basal ganglia and thalamus, likely related to prior hypertensive microhemorrhages. No hydrocephalus or extra-axial collection. Confluent T2 hyperintense signal in the periventricular white matter and pons, likely the sequela of moderate to severe chronic small vessel ischemic disease, which has progressed from the prior exam. Encephalomalacia in the medial right parietal lobe (series 13, image 38 and series 12, image 28), likely related to remote parietal cortical infarct, although this appears new compared to 2020. Vascular: Please see MRA findings below. Skull and upper cervical spine: Normal marrow signal. Sinuses/Orbits: No acute or significant finding. Status post bilateral lens replacements. Other: The mastoids are well aerated. MRA HEAD FINDINGS Anterior circulation: Both internal carotid arteries are patent to the termini, with  mild irregularity but without significant stenosis. Hypoplastic right A1 with severe focal stenosis distally (series 1, image 101). Mild narrowing in a patent left A1. Normal anterior communicating artery. Multifocal narrowing in the left ACA, with severe stenosis in the A2 segment (series 1, image 109), and complete non opacification of the more distal left ACA (series 1, images 160-166) . Mild narrowing in the proximal right A2 (series 1, image 107), with significantly diminished opacification of the distal right ACA (series 1, image 163) and mild multifocal narrowing in the distal ACA branches (series 1, image 163). Mild narrowing in the distal left M1 (series 100, image 102) and in the proximal inferior left M2 (series 1, image 99). Poor perfusion of additional branches of the inferior left M2, with intermittent opacification. Distal left MCA branches are irregular but otherwise perfused. Irregularity without focal stenosis in the right M1. Multifocal narrowing in the M2 and more distal right MCA branches, with focal poor opacification of some M3 branches (for example series 1, images 105-116). Posterior circulation: The left V4 segment and left PICA origin are patent. The right V4 is poorly opacified proximally and not opacified distally, with some retrograde flow in the most distal aspect of the right V4. A prominent right AICA is visualized. Basilar somewhat irregular but patent to its  distal aspect. Superior cerebellar arteries patent proximally. Bilateral P1 segments originate from the basilar artery. The left posterior communicating artery is visualized, although there appears to be significant stenosis in the midportion of the vessel, with poor signal. PCAs with multifocal irregularity, as well as focal stenosis in the left distal P2 (series 1, image 84) and proximal P3 segment (series 1, images 92-96). Poor visualization of the P3s bilaterally. Anatomic variants: None significant MRA NECK FINDINGS Aortic  arch: Three-vessel arch without significant stenosis. No dissection or aneurysm. Right carotid system: Mild narrowing at the bifurcation, without hemodynamically significant stenosis. No evidence of occlusion or aneurysm. Left carotid system: Mild narrowing at the bifurcation without hemodynamically significant stenosis. No evidence of occlusion or aneurysm. Vertebral arteries: Normal left vertebral artery, which is patent from its origin to the vertebrobasilar junction, without significant stenosis, occlusion, or aneurysm. Multifocal narrowing of the right V1, which appears patent, with intermittent non opacification of the right V2 (for example series 17, images 46-48). Intermittent opacification is also noted in the V3 and V4 segments. Other: None IMPRESSION: 1. No acute infarct. Sequela of chronic small vessel disease, with a remote right parietal cortical infarct that is new compared to 2020. 2. Intermittent non opacification of the right V2 and V3 segments, with non opacification of the majority of the right V4 segment, likely secondary to multifocal atherosclerotic narrowing. No other hemodynamically significant stenosis in the neck. 3. No other intracranial proximal large vessel occlusion, however there is multifocal intracranial atherosclerotic narrowing, with focal non opacification of the distal ACAs. Additional severe stenosis or poor perfusion is noted in the distal right A1, proximal left A2, left M2, distal left P2, bilateral distal MCAs and PCAs, and left posterior communicating artery. Electronically Signed   By: Merilyn Baba M.D.   On: 07/19/2021 13:27   MR BRAIN WO CONTRAST  Result Date: 07/19/2021 CLINICAL DATA:  Weakness and confusion for 3 days, stroke suspected EXAM: MRI HEAD WITHOUT CONTRAST MRA HEAD WITHOUT CONTRAST MRA NECK WITHOUT AND WITH CONTRAST TECHNIQUE: Multiplanar, multi-echo pulse sequences of the brain and surrounding structures were acquired without intravenous contrast.  Angiographic images of the Circle of Willis were acquired using MRA technique without intravenous contrast. Angiographic images of the neck were acquired using MRA technique without and with intravenous contrast. Carotid stenosis measurements (when applicable) are obtained utilizing NASCET criteria, using the distal internal carotid diameter as the denominator. CONTRAST:  81mL GADAVIST GADOBUTROL 1 MMOL/ML IV SOLN COMPARISON:  MRI head 08/15/2018, correlation is also made with CT head 07/18/2021. No prior MRA FINDINGS: MRI HEAD FINDINGS Brain: No restricted diffusion to suggest acute or subacute infarct. No acute hemorrhage, mass, mass effect, or midline shift. Area of susceptibility in the right frontal lobe may be related to a prior infarct. Focus of susceptibility in the left basal ganglia and thalamus, likely related to prior hypertensive microhemorrhages. No hydrocephalus or extra-axial collection. Confluent T2 hyperintense signal in the periventricular white matter and pons, likely the sequela of moderate to severe chronic small vessel ischemic disease, which has progressed from the prior exam. Encephalomalacia in the medial right parietal lobe (series 13, image 38 and series 12, image 28), likely related to remote parietal cortical infarct, although this appears new compared to 2020. Vascular: Please see MRA findings below. Skull and upper cervical spine: Normal marrow signal. Sinuses/Orbits: No acute or significant finding. Status post bilateral lens replacements. Other: The mastoids are well aerated. MRA HEAD FINDINGS Anterior circulation: Both internal carotid arteries are patent to  the termini, with mild irregularity but without significant stenosis. Hypoplastic right A1 with severe focal stenosis distally (series 1, image 101). Mild narrowing in a patent left A1. Normal anterior communicating artery. Multifocal narrowing in the left ACA, with severe stenosis in the A2 segment (series 1, image 109), and  complete non opacification of the more distal left ACA (series 1, images 160-166) . Mild narrowing in the proximal right A2 (series 1, image 107), with significantly diminished opacification of the distal right ACA (series 1, image 163) and mild multifocal narrowing in the distal ACA branches (series 1, image 163). Mild narrowing in the distal left M1 (series 100, image 102) and in the proximal inferior left M2 (series 1, image 99). Poor perfusion of additional branches of the inferior left M2, with intermittent opacification. Distal left MCA branches are irregular but otherwise perfused. Irregularity without focal stenosis in the right M1. Multifocal narrowing in the M2 and more distal right MCA branches, with focal poor opacification of some M3 branches (for example series 1, images 105-116). Posterior circulation: The left V4 segment and left PICA origin are patent. The right V4 is poorly opacified proximally and not opacified distally, with some retrograde flow in the most distal aspect of the right V4. A prominent right AICA is visualized. Basilar somewhat irregular but patent to its distal aspect. Superior cerebellar arteries patent proximally. Bilateral P1 segments originate from the basilar artery. The left posterior communicating artery is visualized, although there appears to be significant stenosis in the midportion of the vessel, with poor signal. PCAs with multifocal irregularity, as well as focal stenosis in the left distal P2 (series 1, image 84) and proximal P3 segment (series 1, images 92-96). Poor visualization of the P3s bilaterally. Anatomic variants: None significant MRA NECK FINDINGS Aortic arch: Three-vessel arch without significant stenosis. No dissection or aneurysm. Right carotid system: Mild narrowing at the bifurcation, without hemodynamically significant stenosis. No evidence of occlusion or aneurysm. Left carotid system: Mild narrowing at the bifurcation without hemodynamically  significant stenosis. No evidence of occlusion or aneurysm. Vertebral arteries: Normal left vertebral artery, which is patent from its origin to the vertebrobasilar junction, without significant stenosis, occlusion, or aneurysm. Multifocal narrowing of the right V1, which appears patent, with intermittent non opacification of the right V2 (for example series 17, images 46-48). Intermittent opacification is also noted in the V3 and V4 segments. Other: None IMPRESSION: 1. No acute infarct. Sequela of chronic small vessel disease, with a remote right parietal cortical infarct that is new compared to 2020. 2. Intermittent non opacification of the right V2 and V3 segments, with non opacification of the majority of the right V4 segment, likely secondary to multifocal atherosclerotic narrowing. No other hemodynamically significant stenosis in the neck. 3. No other intracranial proximal large vessel occlusion, however there is multifocal intracranial atherosclerotic narrowing, with focal non opacification of the distal ACAs. Additional severe stenosis or poor perfusion is noted in the distal right A1, proximal left A2, left M2, distal left P2, bilateral distal MCAs and PCAs, and left posterior communicating artery. Electronically Signed   By: Merilyn Baba M.D.   On: 07/19/2021 13:27   EEG adult  Result Date: 07/19/2021 Jaynie Bream, MD     07/19/2021  9:36 PM ROUTINE EEG REPORT DATE OF STUDY:  07/19/21 from 16:55 to 17:35 HISTORY: 88yo woman with prior stroke with residual left sided weakness with episode of left sided weakness and slurred speech. DESCRIPTION: During maximal wakefulness, the background was organized and  continuous but mildly slow.  There was a posterior dominant rhythm seen up to 6 Hz. Spontaneous variability and reactivity to stimulation were present. Stage I (N1) sleep was recorded, with alpha dropout, slow roving eye movements, high-voltage centrally predominant vertex waves, and positive  occipital sharp transients of sleep (POSTS). Stage II (N2) sleep was recorded, with bilateral and symmetric K complexes, and sleep spindles at 12-16 Hz. There was a mild asymmetry with decreased amplitudes seen over the right frontal region. There were frequent very brief bursts of polymorphic delta slowing seen moreso over the right hemisphere between 1-5 seconds. No epileptiform discharges were recorded. No clinical or electrographic seizures were recorded.  Hyperventilation and photic stimulation were not performed. CLASSIFICATION (EEG IMPRESSION): Abnormal Significance II (awake, asleep) Intermittent slow, lateralized, right Asymmetry (mild), right frontal decreased Background slow CLINICAL INTERPRETATION: 1) There is evidence of moderate focal cerebral dysfunction located over the right frontal region which is in keeping with patient's history of prior infarct located there. 2) There is also evidence of a diffuse mild to moderate encephalopathy which is non-specific and could be related to underlying neurocognitive disorders, multiple prior infarcts, or sedating medications. 3) No seizures were seen on this recording.  A routine EEG does not exclude a diagnosis of epilepsy. Colby A. Marvel Plan, MD Neurology and Clinical Neurophysiology  ECHOCARDIOGRAM COMPLETE  Result Date: 07/19/2021    ECHOCARDIOGRAM REPORT   Patient Name:   [facility] Date of Exam: 07/19/2021 Medical Rec #:  [id number]   Height:       66.0 in Accession #:    [id number]  Weight:       164.9 lb Date of Birth:  31-Oct-1927   BSA:          1.842 m Patient Age:    19 years    BP:           155/58 mmHg Patient Gender: F           HR:           72 bpm. Exam Location:  [facility] Procedure: 2D Echo, Cardiac Doppler and Color Doppler Indications:    Stroke  History:        Patient has no prior history of Echocardiogram examinations.                 Stroke, Signs/Symptoms:Alzheimer's; Risk Factors:Hypertension                 and Diabetes.   Sonographer:    Wenda Low Referring Phys: [id number] OLADAPO ADEFESO IMPRESSIONS  1. Left ventricular ejection fraction, by estimation, is 60 to 65%. The left ventricle has normal function. The left ventricle has no regional wall motion abnormalities. There is mild left ventricular hypertrophy. Left ventricular diastolic parameters are consistent with Grade I diastolic dysfunction (impaired relaxation).  2. Right ventricular systolic function is normal. The right ventricular size is normal. There is mildly elevated pulmonary artery systolic pressure. The estimated right ventricular systolic pressure is 67.5 mmHg.  3. The mitral valve is normal in structure. Trivial mitral valve regurgitation. No evidence of mitral stenosis.  4. The aortic valve is tricuspid. Aortic valve regurgitation is not visualized. No aortic stenosis is present.  5. The inferior vena cava is normal in size with greater than 50% respiratory variability, suggesting right atrial pressure of 3 mmHg. FINDINGS  Left Ventricle: Left ventricular ejection fraction, by estimation, is 60 to 65%. The left ventricle has normal function. The left ventricle has no regional wall  motion abnormalities. The left ventricular internal cavity size was normal in size. There is  mild left ventricular hypertrophy. Left ventricular diastolic parameters are consistent with Grade I diastolic dysfunction (impaired relaxation). Right Ventricle: The right ventricular size is normal. No increase in right ventricular wall thickness. Right ventricular systolic function is normal. There is mildly elevated pulmonary artery systolic pressure. The tricuspid regurgitant velocity is 2.88  m/s, and with an assumed right atrial pressure of 3 mmHg, the estimated right ventricular systolic pressure is 07.6 mmHg. Left Atrium: Left atrial size was normal in size. Right Atrium: Right atrial size was normal in size. Pericardium: There is no evidence of pericardial effusion. Presence of  epicardial fat layer. Mitral Valve: The mitral valve is normal in structure. Trivial mitral valve regurgitation. No evidence of mitral valve stenosis. MV peak gradient, 8.6 mmHg. The mean mitral valve gradient is 2.0 mmHg. Tricuspid Valve: The tricuspid valve is normal in structure. Tricuspid valve regurgitation is trivial. Aortic Valve: The aortic valve is tricuspid. Aortic valve regurgitation is not visualized. No aortic stenosis is present. Aortic valve mean gradient measures 4.0 mmHg. Aortic valve peak gradient measures 7.4 mmHg. Aortic valve area, by VTI measures 2.83 cm. Pulmonic Valve: The pulmonic valve was not well visualized. Pulmonic valve regurgitation is not visualized. Aorta: The aortic root is normal in size and structure. Venous: The inferior vena cava is normal in size with greater than 50% respiratory variability, suggesting right atrial pressure of 3 mmHg. IAS/Shunts: The interatrial septum was not well visualized.  LEFT VENTRICLE PLAX 2D LVIDd:         4.20 cm     Diastology LVIDs:         3.10 cm     LV e' medial:    5.55 cm/s LV PW:         1.20 cm     LV E/e' medial:  12.8 LV IVS:        1.30 cm     LV e' lateral:   5.00 cm/s LVOT diam:     2.00 cm     LV E/e' lateral: 14.2 LV SV:         83 LV SV Index:   45 LVOT Area:     3.14 cm  LV Volumes (MOD) LV vol d, MOD A2C: 45.8 ml LV vol d, MOD A4C: 47.6 ml LV vol s, MOD A2C: 22.2 ml LV vol s, MOD A4C: 16.3 ml LV SV MOD A2C:     23.6 ml LV SV MOD A4C:     47.6 ml LV SV MOD BP:      28.6 ml RIGHT VENTRICLE RV Basal diam:  3.00 cm RV Mid diam:    2.10 cm RV S prime:     18.60 cm/s TAPSE (M-mode): 2.3 cm LEFT ATRIUM             Index        RIGHT ATRIUM           Index LA diam:        3.90 cm 2.12 cm/m   RA Area:     15.40 cm LA Vol (A2C):   44.6 ml 24.21 ml/m  RA Volume:   36.30 ml  19.70 ml/m LA Vol (A4C):   38.4 ml 20.84 ml/m LA Biplane Vol: 41.8 ml 22.69 ml/m  AORTIC VALVE                    PULMONIC VALVE AV Area (  Vmax):    2.84 cm      PV Vmax:       0.92 m/s AV Area (Vmean):   2.86 cm     PV Peak grad:  3.4 mmHg AV Area (VTI):     2.83 cm AV Vmax:           136.00 cm/s AV Vmean:          85.300 cm/s AV VTI:            0.294 m AV Peak Grad:      7.4 mmHg AV Mean Grad:      4.0 mmHg LVOT Vmax:         123.00 cm/s LVOT Vmean:        77.700 cm/s LVOT VTI:          0.265 m LVOT/AV VTI ratio: 0.90  AORTA Ao Root diam: 2.90 cm MITRAL VALVE                TRICUSPID VALVE MV Area (PHT): 2.97 cm     TR Peak grad:   33.2 mmHg MV Area VTI:   2.80 cm     TR Vmax:        288.00 cm/s MV Peak grad:  8.6 mmHg MV Mean grad:  2.0 mmHg     SHUNTS MV Vmax:       1.47 m/s     Systemic VTI:  0.26 m MV Vmean:      65.1 cm/s    Systemic Diam: 2.00 cm MV Decel Time: 255 msec MV E velocity: 71.10 cm/s MV A velocity: 113.00 cm/s MV E/A ratio:  0.63 Oswaldo Milian MD Electronically signed by Oswaldo Milian MD Signature Date/Time: 07/19/2021/3:42:13 PM    Final    CT HEAD CODE STROKE WO CONTRAST  Result Date: 07/18/2021 CLINICAL DATA:  Code stroke. EXAM: CT HEAD WITHOUT CONTRAST TECHNIQUE: Contiguous axial images were obtained from the base of the skull through the vertex without intravenous contrast. RADIATION DOSE REDUCTION: This exam was performed according to the departmental dose-optimization program which includes automated exposure control, adjustment of the mA and/or kV according to patient size and/or use of iterative reconstruction technique. COMPARISON:  Prior CT from 11/13/2020. FINDINGS: Brain: Diffuse prominence of the CSF containing spaces compatible generalized cerebral atrophy. Patchy and confluent hypodensity involving the supratentorial cerebral white matter most consistent with chronic small vessel ischemic disease, moderate in nature. Few scatter remote lacunar infarcts present about the deep gray nuclei. No acute intracranial hemorrhage. Hypodensity involving the parasagittal right frontoparietal region adjacent to the falx consistent  with a right ACA territory ischemic infarct, age indeterminate. Upon review of prior CT from 11/13/2020, there is suggestion of a small acute ischemic infarct at this location on prior head CT, although the area involved on today's exam appears larger, and a superimposed acute component would be difficult to exclude. No other visible large vessel territory infarct. No mass lesion, mass effect, or midline shift. No hydrocephalus or extra-axial fluid collection. Vascular: No visible hyperdense vessel. Calcified atherosclerosis present at the skull base. Skull: Scalp soft tissues and calvarium within normal limits. Sinuses/Orbits: Globes and orbital soft tissues demonstrate no acute finding. Visualized paranasal sinuses and mastoid air cells are clear. Other: None. ASPECTS [facility] Stroke Program Early CT Score) - Ganglionic level infarction (caudate, lentiform nuclei, internal capsule, insula, M1-M3 cortex): 7 - Supraganglionic infarction (M4-M6 cortex): 3 Total score (0-10 with 10 being normal): 10 IMPRESSION: 1. Age-indeterminate posterior right ACA territory  infarct involving the parasagittal right frontoparietal region. While this could be chronic in nature, a possible acute to subacute component is difficult to exclude, and could be considered in the correct clinical setting. No intracranial hemorrhage. 2. ASPECTS is 10. 3. Underlying age-related cerebral atrophy with moderate chronic microvascular ischemic disease. Critical Value/emergent results were called by telephone at the time of interpretation on 07/18/2021 at 10:51 pm to provider Godfrey Pick , who verbally acknowledged these results. Electronically Signed   By: Jeannine Boga M.D.   On: 07/18/2021 22:58     PHYSICAL EXAM  Temp:  [98 F (36.7 C)-98.9 F (37.2 C)] 98 F (36.7 C) (01/19 0418) Pulse Rate:  [68-76] 68 (01/19 0418) Resp:  [18-19] 19 (01/19 0418) BP: (145-169)/(69-95) 164/72 (01/19 0418) SpO2:  [97 %-100 %] 97 % (01/19  0418)  General - Well nourished, well developed, in no apparent distress.   Ophthalmologic - fundi not visualized due to noncooperation.   Cardiovascular - Regular rhythm and rate.   Neuro - awake, alert, eyes open, orientated to self only, but not to age, place, time. No aphasia, able to speak short sentences but paucity of speech with incoherent content, but following all simple commands although psychomotor slowing. Able to name 1/3 and repeat simple sentences. No gaze palsy, tracking bilaterally, blinking to visual threat bilaterally. No facial droop. Tongue midline. RUE raise up high without drift. LUE raise up in low position but higher than yesterday, against gravity without drift. L hand grip weaker than right. RLE 3+/5 proximal and 4/5 distal toe PF/DF, LLE 3/5 proximal and 3/5 distal toe PF/DF. Sensation symmetrical bilaterally subjectively, b/l FTN intact but slow, gait not tested.    ASSESSMENT/PLAN Ms. Julia [name] is a 88 y.o. female with history of HTN, DM, Alzheimer's Disease, stroke (residual left leg weakness) who was admitted from local SNF for left sided weakness, left facial droop and slurred speech. Symptoms were resolved on arrival, no tPA given.   TIA:  possible right brain TIA, however, seizure is still in DDx CT head showed age--indeterminate posterior right ACA territory infarct involving the parasagittal right frontoparietal region, new comparing with her 10/2020 CT head.  MRI  No acute infarct. Remote right parietal cortical infarct that is new compared to 2020. MRA  neck Intermittent non opacification of the right V2 and V3 segments, with non opacification of the majority of the right V4 segment, likely secondary to multifocal atherosclerotic narrowing MRA head multifocal intracranial atherosclerotic narrowing, with focal non opacification of the distal ACAs, distal right A1, proximal left A2, left M2, distal left P2, bilateral distal MCAs and PCAs, and left PCOM 2D  Echo  EF 60-65% EEG no seizure, moderate focal cerebral dysfunction located over the right frontal region which is in keeping with patient's history of prior infarct located there. diffuse mild to moderate encephalopathy which is non-specific  LDL 93 HgbA1c 5.7 SCDs for VTE prophylaxis aspirin 81 mg daily prior to admission, now on aspirin 81 mg daily and clopidogrel 75 mg daily DAPT for 3 weeks and then plavix alone Ongoing aggressive stroke risk factor management Therapy recommendations:  HH PT OT Disposition:  pending  Diabetes HgbA1c 5.7 goal < 7.0 Controlled CBG monitoring SSI DM education and close PCP follow up  Hypertension Stable Long term BP goal normotensive  Hyperlipidemia Home meds:  none  LDL 93, goal < 70 Now on pravastatin 20 No high intensity statin given advanced age and LDL not far from goal Continue statin at discharge  Other Stroke Risk Factors Advanced age Hx stroke/TIA - 08/2020 admitted to [facility] with left sided weakness. With rehab later, her left arm weakness was recovered but her leg weakness remained and she could not walk anymore. At baseline in SNF, she was wheelchair bound but able to standup from wheelchair but not able to walk.  Other Active Problems Alzheimer's Disease  Hospital day # 0  Neurology will sign off. Please call with questions. Pt will follow up with Dr. Merlene Laughter as outpt in about 4 weeks. Thanks for the consult.   Rosalin Hawking, MD PhD Stroke Neurology 07/20/2021 8:38 AM    To contact Stroke Continuity provider, please refer to [email or web address] After hours, contact General Neurology

## 2021-07-20 NOTE — Progress Notes (Signed)
Patient is being discharged back to [name], report has been given and IV's removed.

## 2021-07-20 NOTE — Care Management Obs Status (Signed)
[facility] NOTIFICATION   Patient Details  Name: Julia [name] MRN: [id number] Date of Birth: Sep 21, 1927   Medicare Observation Status Notification Given:  Yes (copy left in room as requested)    Tommy Medal 07/20/2021, 11:15 AM

## 2021-07-22 LAB — URINE CULTURE: Culture: >=100000 — AB

## 2021-07-25 DIAGNOSIS — Z7902 Long term (current) use of antithrombotics/antiplatelets: Secondary | ICD-10-CM | POA: Diagnosis not present

## 2021-07-25 DIAGNOSIS — I69354 Hemiplegia and hemiparesis following cerebral infarction affecting left non-dominant side: Secondary | ICD-10-CM | POA: Diagnosis not present

## 2021-07-25 DIAGNOSIS — I081 Rheumatic disorders of both mitral and tricuspid valves: Secondary | ICD-10-CM | POA: Diagnosis not present

## 2021-07-25 DIAGNOSIS — F339 Major depressive disorder, recurrent, unspecified: Secondary | ICD-10-CM | POA: Diagnosis not present

## 2021-07-25 DIAGNOSIS — E1165 Type 2 diabetes mellitus with hyperglycemia: Secondary | ICD-10-CM | POA: Diagnosis not present

## 2021-07-25 DIAGNOSIS — E1151 Type 2 diabetes mellitus with diabetic peripheral angiopathy without gangrene: Secondary | ICD-10-CM | POA: Diagnosis not present

## 2021-07-25 DIAGNOSIS — G309 Alzheimer's disease, unspecified: Secondary | ICD-10-CM | POA: Diagnosis not present

## 2021-07-25 DIAGNOSIS — G301 Alzheimer's disease with late onset: Secondary | ICD-10-CM | POA: Diagnosis not present

## 2021-07-25 DIAGNOSIS — F02B18 Dementia in other diseases classified elsewhere, moderate, with other behavioral disturbance: Secondary | ICD-10-CM | POA: Diagnosis not present

## 2021-07-25 DIAGNOSIS — F0284 Dementia in other diseases classified elsewhere, unspecified severity, with anxiety: Secondary | ICD-10-CM | POA: Diagnosis not present

## 2021-07-25 DIAGNOSIS — N390 Urinary tract infection, site not specified: Secondary | ICD-10-CM | POA: Diagnosis not present

## 2021-07-25 DIAGNOSIS — Z7984 Long term (current) use of oral hypoglycemic drugs: Secondary | ICD-10-CM | POA: Diagnosis not present

## 2021-07-25 DIAGNOSIS — L84 Corns and callosities: Secondary | ICD-10-CM | POA: Diagnosis not present

## 2021-07-25 DIAGNOSIS — I119 Hypertensive heart disease without heart failure: Secondary | ICD-10-CM | POA: Diagnosis not present

## 2021-07-27 DIAGNOSIS — M545 Low back pain, unspecified: Secondary | ICD-10-CM | POA: Diagnosis not present

## 2021-07-27 DIAGNOSIS — N390 Urinary tract infection, site not specified: Secondary | ICD-10-CM | POA: Diagnosis not present

## 2021-07-27 DIAGNOSIS — E1122 Type 2 diabetes mellitus with diabetic chronic kidney disease: Secondary | ICD-10-CM | POA: Diagnosis not present

## 2021-07-27 DIAGNOSIS — I1310 Hypertensive heart and chronic kidney disease without heart failure, with stage 1 through stage 4 chronic kidney disease, or unspecified chronic kidney disease: Secondary | ICD-10-CM | POA: Diagnosis not present

## 2021-07-27 DIAGNOSIS — Z8616 Personal history of COVID-19: Secondary | ICD-10-CM | POA: Diagnosis not present

## 2021-07-27 DIAGNOSIS — N1831 Chronic kidney disease, stage 3a: Secondary | ICD-10-CM | POA: Diagnosis not present

## 2021-07-27 DIAGNOSIS — E559 Vitamin D deficiency, unspecified: Secondary | ICD-10-CM | POA: Diagnosis not present

## 2021-07-27 DIAGNOSIS — F4321 Adjustment disorder with depressed mood: Secondary | ICD-10-CM | POA: Diagnosis not present

## 2021-07-28 DIAGNOSIS — I119 Hypertensive heart disease without heart failure: Secondary | ICD-10-CM | POA: Diagnosis not present

## 2021-07-28 DIAGNOSIS — G309 Alzheimer's disease, unspecified: Secondary | ICD-10-CM | POA: Diagnosis not present

## 2021-07-28 DIAGNOSIS — N390 Urinary tract infection, site not specified: Secondary | ICD-10-CM | POA: Diagnosis not present

## 2021-07-28 DIAGNOSIS — I69354 Hemiplegia and hemiparesis following cerebral infarction affecting left non-dominant side: Secondary | ICD-10-CM | POA: Diagnosis not present

## 2021-07-28 DIAGNOSIS — F0284 Dementia in other diseases classified elsewhere, unspecified severity, with anxiety: Secondary | ICD-10-CM | POA: Diagnosis not present

## 2021-07-28 DIAGNOSIS — E1165 Type 2 diabetes mellitus with hyperglycemia: Secondary | ICD-10-CM | POA: Diagnosis not present

## 2021-07-30 DIAGNOSIS — N390 Urinary tract infection, site not specified: Secondary | ICD-10-CM | POA: Diagnosis not present

## 2021-07-30 DIAGNOSIS — I69354 Hemiplegia and hemiparesis following cerebral infarction affecting left non-dominant side: Secondary | ICD-10-CM | POA: Diagnosis not present

## 2021-07-30 DIAGNOSIS — G309 Alzheimer's disease, unspecified: Secondary | ICD-10-CM | POA: Diagnosis not present

## 2021-07-30 DIAGNOSIS — F0284 Dementia in other diseases classified elsewhere, unspecified severity, with anxiety: Secondary | ICD-10-CM | POA: Diagnosis not present

## 2021-07-30 DIAGNOSIS — I119 Hypertensive heart disease without heart failure: Secondary | ICD-10-CM | POA: Diagnosis not present

## 2021-07-30 DIAGNOSIS — E1165 Type 2 diabetes mellitus with hyperglycemia: Secondary | ICD-10-CM | POA: Diagnosis not present

## 2021-07-31 DIAGNOSIS — F0284 Dementia in other diseases classified elsewhere, unspecified severity, with anxiety: Secondary | ICD-10-CM | POA: Diagnosis not present

## 2021-07-31 DIAGNOSIS — I119 Hypertensive heart disease without heart failure: Secondary | ICD-10-CM | POA: Diagnosis not present

## 2021-07-31 DIAGNOSIS — N390 Urinary tract infection, site not specified: Secondary | ICD-10-CM | POA: Diagnosis not present

## 2021-07-31 DIAGNOSIS — E1165 Type 2 diabetes mellitus with hyperglycemia: Secondary | ICD-10-CM | POA: Diagnosis not present

## 2021-07-31 DIAGNOSIS — I69354 Hemiplegia and hemiparesis following cerebral infarction affecting left non-dominant side: Secondary | ICD-10-CM | POA: Diagnosis not present

## 2021-07-31 DIAGNOSIS — G309 Alzheimer's disease, unspecified: Secondary | ICD-10-CM | POA: Diagnosis not present

## 2021-08-02 ENCOUNTER — Other Ambulatory Visit: Payer: Self-pay

## 2021-08-02 ENCOUNTER — Non-Acute Institutional Stay: Payer: Self-pay | Admitting: Nurse Practitioner

## 2021-08-02 ENCOUNTER — Encounter: Payer: Self-pay | Admitting: Nurse Practitioner

## 2021-08-02 VITALS — BP 146/70 | HR 77 | Temp 97.5°F | Resp 18

## 2021-08-02 DIAGNOSIS — G309 Alzheimer's disease, unspecified: Secondary | ICD-10-CM | POA: Diagnosis not present

## 2021-08-02 DIAGNOSIS — R5381 Other malaise: Secondary | ICD-10-CM | POA: Diagnosis not present

## 2021-08-02 DIAGNOSIS — E1165 Type 2 diabetes mellitus with hyperglycemia: Secondary | ICD-10-CM | POA: Diagnosis not present

## 2021-08-02 DIAGNOSIS — F0284 Dementia in other diseases classified elsewhere, unspecified severity, with anxiety: Secondary | ICD-10-CM | POA: Diagnosis not present

## 2021-08-02 DIAGNOSIS — I639 Cerebral infarction, unspecified: Secondary | ICD-10-CM

## 2021-08-02 DIAGNOSIS — I119 Hypertensive heart disease without heart failure: Secondary | ICD-10-CM | POA: Diagnosis not present

## 2021-08-02 DIAGNOSIS — R630 Anorexia: Secondary | ICD-10-CM

## 2021-08-02 DIAGNOSIS — Z515 Encounter for palliative care: Secondary | ICD-10-CM

## 2021-08-02 DIAGNOSIS — N390 Urinary tract infection, site not specified: Secondary | ICD-10-CM | POA: Diagnosis not present

## 2021-08-02 DIAGNOSIS — I69354 Hemiplegia and hemiparesis following cerebral infarction affecting left non-dominant side: Secondary | ICD-10-CM | POA: Diagnosis not present

## 2021-08-02 NOTE — Progress Notes (Signed)
[facility] Consult Note Telephone: [phone]  Fax: [phone]   Date of encounter: 08/02/21 10:32 AM PATIENT NAME: Julia [name] [place] [address]   [phone] (home)  DOB: Jul 24, 1927 MRN: [id number] PRIMARY CARE PROVIDER:    [facility]  RESPONSIBLE PARTY:    Contact Information     Name Relation Home Work Mobile   Cornick,Julia Spouse [phone]     Luccia, Reinheimer Daughter   [phone]       Due to the COVID-19 crisis, this visit was done via telemedicine from my office and it was initiated and consent by this patient and or family.  I connected with Vance Gather RN PC with  [facility] on 08/02/21 by a video enabled telemedicine application and verified that I am speaking with the correct person using two identifiers.   I discussed the limitations of evaluation and management by telemedicine. The patient expressed understanding and agreed to proceed.   I met by telemedicine video with patient and family in the facility connecting virtually with Vance Gather, RN. Palliative Care was asked to follow this patient by consultation request of  [facility] to address advance care planning and complex medical decision making. This is the initial visit.   ASSESSMENT AND PLAN / RECOMMENDATIONS:  Advance Care Planning/Goals of Care: Goals include to maximize quality of life and symptom management. Patient/health care surrogate gave his/her permission to discuss.Our advance care planning conversation included a discussion about:    The value and importance of advance care planning  Experiences with loved ones who have been seriously ill or have died-father-in-law was previously under hospice care.  Exploration of personal, cultural or spiritual beliefs that might influence medical decisions  Exploration of goals of care in the event of a sudden injury or illness-Julia would be interested in  hospice when appropriate.  Identification  of a healthcare agent-shared between [place] and Darius Bump per [place] documents on chart.  Review an  advance directive document . Decision already made for DNR. CODE STATUS:  DNR-gold form on chart will upload to [facility].   Symptom Management/Plan: 1. Advance Care Planning; DNR  2. Goals of Care: Goals include to maximize quality of life and symptom management. Our advance care planning conversation included a discussion about:    The value and importance of advance care planning  Exploration of personal, cultural or spiritual beliefs that might influence medical decisions  Exploration of goals of care in the event of a sudden injury or illness  Identification and preparation of a healthcare agent  Review and updating or creation of an advance directive document.  3. Anorexia; current weight in 06/2021 noted 164.8 lbs; decreased appetite since cva; will continue to encourage to eat, supplement if needed.   4. Debility secondary to late onset cva; still establishing new baseline. PT/OT working with [facility] [name], prior to cva speaking, now speech, processing is very difficult to understand, encourage mobility with assistance at baseline w/c dependent. Fall risk  5. Palliative care encounter; Palliative care encounter; Palliative medicine team will continue to support patient, patient's family, and medical team. Visit consisted of counseling and education dealing with the complex and emotionally intense issues of symptom management and palliative care in the setting of serious and potentially life-threatening illness 6. f/u 1 month for ongoing monitoring chronic disease progression, ongoing discussions complex medical decision making  Follow up Palliative Care Visit: Palliative care will continue to follow for  complex medical decision making, advance care planning, and clarification of goals. Return 4 weeks or prn.  I spent 61 minutes  providing this consultation. More than 50% of the time in this consultation was spent in counseling and care coordination.  PPS: 40%  Chief Complaint: Initial palliative consult for complex medical decision making  HISTORY OF PRESENT ILLNESS:  Kathaleya Mcduffee is a 88 y.o. year old female  with multiple medical problems including Alzheimer's dementia, HTN, DM. Hospitalized 07/18/2021 to 07/20/2021 for baseline left-sided weakness due to prior stroke.  She is admitted for further stroke work-up.  Seen by neurology.  MRI brain was unrevealing for acute stroke.  Echocardiogram also unrevealing.  EEG was performed that did not show any seizure activity.  There was concern that she may have had a TIA.  Recommended to continue on dual antiplatelet therapy for the next 3 weeks after which she can continue on Plavix alone.  She is continued on statin.  Overall neurodeficits are returned to baseline.  Urine indicated possible urinary tract infection.  She was started on a course of Macrodantin.  I connected by telemedicine video with Vance Gather RN PC with [name]. Julia [name] was sitting up in w/c, she made eye contact, attempted to be interactive though difficulty understanding. Julia [name] speech was slow, mumbled with some clear words. Julia [name] was able to follow commands. Case reviewed with facility staff, Julia [name] continues to receive PT/OT to see where new baseline is. Appetite is declined and facility to re-weigh. ROS, symptoms. Julia [name] is w/c dependent from prior late onset CVA's, requires assistance with ADL's. Julia [name] does feeds herself. Julia [name] appears comfortable, cooperative with assessment. Emotional support provided. DNR in place. Updated staff f/u 1 month when close to therapy to being completed.   Vance Gather RN PC Connected with son-Julia [name] by telephone and patient history shared.  Patient became wheelchair bound in December of 2021 after having a CVA.  She was sent to the [facility] for rehab but was unable to progress with ambulation.  She has [facility] a [facility] of [facility].  Most recent hospitalization was 07/18/21 with a TIA.  Julia [name] endorses frequent TIA symptoms with patient over the last year.  She does have left-sided hemiparesis.  Julia confirms DNR status and HCPOA documents are present on chart.  We further discussed hospice and family desires for services if patient declines.  Julia [name] endorses family is open to hospice when patient qualifies.  Advised that Palliative Care will continue to follow patient and if patient displays a decline, we will revisit hospice services.   History obtained from review of EMR, discussion with primary team, and interview with family, facility staff/caregiver and/or Julia [name].  I reviewed available labs, medications, imaging, studies and related documents from the EMR.  Records reviewed and summarized above.   ROS Per facility staff General: NAD EYES: denies vision changes ENMT: denies dysphagia Cardiovascular: denies chest pain, denies DOE Pulmonary: denies cough, denies increased SOB Abdomen: endorses fair appetite, denies constipation, + incontinence of bowel GU: denies dysuria, endorses + incontinence of urine MSK:  + increased weakness,  no falls reported Skin: denies rashes or wounds Neurological: denies pain, denies insomnia Psych: Endorses positive mood Heme/lymph/immuno: denies bruises, abnormal bleeding  Physical Exam: completed by Vance Gather RN PC Constitutional: NAD General: frail appearing EYES: anicteric sclera, lids intact, no discharge  ENMT: intact hearing, oral mucous membranes moist, dentition intact CV: S1S2, RRR, no  LE edema Pulmonary: LCTA, no increased work of breathing, no cough, room air Abdomen: intake 25-50%, normo-active BS + 4 quadrants, soft and non tender MSK: + sarcopenia, moves all extremities, non-ambulatory, wheelchair bound. Skin: warm and dry Neuro:   + generalized weakness,  + cognitive impairment Psych: non-anxious affect, A and O x 1 Hem/lymph/immuno: no widespread bruising CURRENT PROBLEM LIST:  Patient Active Problem List   Diagnosis Date Noted   TIA (transient ischemic attack) 07/20/2021   Acute lower UTI 07/20/2021   Hyperglycemia due to diabetes mellitus ([place]) 07/19/2021   Essential hypertension 07/19/2021   Alzheimer's disease ([place]) 07/19/2021   PAST MEDICAL HISTORY:  Active Ambulatory Problems    Diagnosis Date Noted   Hyperglycemia due to diabetes mellitus ([place]) 07/19/2021   Essential hypertension 07/19/2021   Alzheimer's disease ([place]) 07/19/2021   TIA (transient ischemic attack) 07/20/2021   Acute lower UTI 07/20/2021   Resolved Ambulatory Problems    Diagnosis Date Noted   No Resolved Ambulatory Problems   Past Medical History:  Diagnosis Date   Alzheimer disease ([place])    Anxiety    CVA (cerebral vascular accident) ([place])    Hypertension    SOCIAL HX:  Social History   Tobacco Use   Smoking status: Unknown   Smokeless tobacco: Not on file  Substance Use Topics   Alcohol use: Not on file   FAMILY HX: reviewed per chart  ALLERGIES:  Allergies  Allergen Reactions   Sulfa Antibiotics Rash    Other reaction(s): Other (See Comments)      PERTINENT MEDICATIONS:  Outpatient Encounter Medications as of 08/02/2021  Medication Sig   acetaminophen (TYLENOL) 500 MG tablet Take 500 mg by mouth 2 (two) times daily.   amLODipine (NORVASC) 2.5 MG tablet Take 2.5 mg by mouth daily.   aspirin EC 81 MG tablet Take 1 tablet (81 mg total) by mouth daily. Swallow whole. Discontinue after 08/10/21   cetirizine (ZYRTEC) 10 MG tablet Take 10 mg by mouth daily.   clopidogrel (PLAVIX) 75 MG tablet Take 1 tablet (75 mg total) by mouth daily.   glipiZIDE (GLUCOTROL) 5 MG tablet Take 5 mg by mouth daily.   lisinopril (ZESTRIL) 40 MG tablet Take 40 mg by mouth daily.   loperamide (IMODIUM A-D) 2 MG tablet Take 2 mg by mouth 4  (four) times daily as needed for diarrhea or loose stools.   LORazepam (ATIVAN) 0.5 MG tablet Take 0.5 mg by mouth daily as needed for anxiety.   metFORMIN (GLUCOPHAGE) 500 MG tablet Take 500 mg by mouth 2 (two) times daily.   nitrofurantoin, macrocrystal-monohydrate, (MACROBID) 100 MG capsule Take 1 capsule (100 mg total) by mouth every 12 (twelve) hours.   NYAMYC powder Apply 1 application topically in the morning and at bedtime.   ondansetron (ZOFRAN-ODT) 4 MG disintegrating tablet Take 1 tablet by mouth every 8 (eight) hours as needed for nausea or vomiting.   pantoprazole (PROTONIX) 20 MG tablet Take 20 mg by mouth daily.   pravastatin (PRAVACHOL) 20 MG tablet Take 1 tablet (20 mg total) by mouth daily at 6 PM.   sertraline (ZOLOFT) 50 MG tablet Take 50 mg by mouth daily.   TUMS 500 MG chewable tablet Chew 2 tablets by mouth 3 (three) times daily as needed for heartburn or indigestion.   Vitamin D, Ergocalciferol, (DRISDOL) 1.25 MG (50000 UNIT) CAPS capsule Take 50,000 Units by mouth once a week. Fridays   No facility-administered encounter medications on file as of 08/02/2021.  Thank you for the opportunity to participate in the care of Julia [name].  The palliative care team will continue to follow. Please call our office at [phone] if we can be of additional assistance.   Lorenza Burton, RN ,   COVID-19 PATIENT SCREENING TOOL Asked and negative response unless otherwise noted:  Have you had symptoms of covid, tested positive or been in contact with someone with symptoms/positive test in the past 5-10 days? No

## 2021-08-03 DIAGNOSIS — G309 Alzheimer's disease, unspecified: Secondary | ICD-10-CM | POA: Diagnosis not present

## 2021-08-03 DIAGNOSIS — I119 Hypertensive heart disease without heart failure: Secondary | ICD-10-CM | POA: Diagnosis not present

## 2021-08-03 DIAGNOSIS — F0284 Dementia in other diseases classified elsewhere, unspecified severity, with anxiety: Secondary | ICD-10-CM | POA: Diagnosis not present

## 2021-08-03 DIAGNOSIS — E1165 Type 2 diabetes mellitus with hyperglycemia: Secondary | ICD-10-CM | POA: Diagnosis not present

## 2021-08-03 DIAGNOSIS — N390 Urinary tract infection, site not specified: Secondary | ICD-10-CM | POA: Diagnosis not present

## 2021-08-03 DIAGNOSIS — I69354 Hemiplegia and hemiparesis following cerebral infarction affecting left non-dominant side: Secondary | ICD-10-CM | POA: Diagnosis not present

## 2021-08-09 DIAGNOSIS — N390 Urinary tract infection, site not specified: Secondary | ICD-10-CM | POA: Diagnosis not present

## 2021-08-09 DIAGNOSIS — F0284 Dementia in other diseases classified elsewhere, unspecified severity, with anxiety: Secondary | ICD-10-CM | POA: Diagnosis not present

## 2021-08-09 DIAGNOSIS — I119 Hypertensive heart disease without heart failure: Secondary | ICD-10-CM | POA: Diagnosis not present

## 2021-08-09 DIAGNOSIS — E1165 Type 2 diabetes mellitus with hyperglycemia: Secondary | ICD-10-CM | POA: Diagnosis not present

## 2021-08-09 DIAGNOSIS — G309 Alzheimer's disease, unspecified: Secondary | ICD-10-CM | POA: Diagnosis not present

## 2021-08-09 DIAGNOSIS — I69354 Hemiplegia and hemiparesis following cerebral infarction affecting left non-dominant side: Secondary | ICD-10-CM | POA: Diagnosis not present

## 2021-08-11 ENCOUNTER — Other Ambulatory Visit: Payer: Self-pay

## 2021-08-11 ENCOUNTER — Inpatient Hospital Stay (HOSPITAL_COMMUNITY): Payer: Medicare Other

## 2021-08-11 ENCOUNTER — Emergency Department (HOSPITAL_COMMUNITY): Payer: Medicare Other

## 2021-08-11 ENCOUNTER — Inpatient Hospital Stay (HOSPITAL_COMMUNITY)
Admission: EM | Admit: 2021-08-11 | Discharge: 2021-08-18 | DRG: 061 | Disposition: A | Discharge status: Skilled Nursing Facility | Payer: Medicare Other | Source: Skilled Nursing Facility | Attending: Family Medicine | Admitting: Family Medicine

## 2021-08-11 ENCOUNTER — Encounter (HOSPITAL_COMMUNITY): Payer: Self-pay | Admitting: Emergency Medicine

## 2021-08-11 DIAGNOSIS — G309 Alzheimer's disease, unspecified: Secondary | ICD-10-CM | POA: Diagnosis present

## 2021-08-11 DIAGNOSIS — E785 Hyperlipidemia, unspecified: Secondary | ICD-10-CM | POA: Diagnosis present

## 2021-08-11 DIAGNOSIS — R29810 Facial weakness: Secondary | ICD-10-CM | POA: Diagnosis present

## 2021-08-11 DIAGNOSIS — Z7902 Long term (current) use of antithrombotics/antiplatelets: Secondary | ICD-10-CM

## 2021-08-11 DIAGNOSIS — Z66 Do not resuscitate: Secondary | ICD-10-CM | POA: Diagnosis present

## 2021-08-11 DIAGNOSIS — I639 Cerebral infarction, unspecified: Secondary | ICD-10-CM | POA: Diagnosis not present

## 2021-08-11 DIAGNOSIS — I6622 Occlusion and stenosis of left posterior cerebral artery: Secondary | ICD-10-CM | POA: Diagnosis not present

## 2021-08-11 DIAGNOSIS — Z993 Dependence on wheelchair: Secondary | ICD-10-CM | POA: Diagnosis not present

## 2021-08-11 DIAGNOSIS — R29818 Other symptoms and signs involving the nervous system: Secondary | ICD-10-CM | POA: Diagnosis not present

## 2021-08-11 DIAGNOSIS — I152 Hypertension secondary to endocrine disorders: Secondary | ICD-10-CM | POA: Diagnosis not present

## 2021-08-11 DIAGNOSIS — G8194 Hemiplegia, unspecified affecting left nondominant side: Secondary | ICD-10-CM | POA: Diagnosis present

## 2021-08-11 DIAGNOSIS — Z515 Encounter for palliative care: Secondary | ICD-10-CM | POA: Diagnosis not present

## 2021-08-11 DIAGNOSIS — Z20822 Contact with and (suspected) exposure to covid-19: Secondary | ICD-10-CM | POA: Diagnosis present

## 2021-08-11 DIAGNOSIS — R2681 Unsteadiness on feet: Secondary | ICD-10-CM | POA: Diagnosis not present

## 2021-08-11 DIAGNOSIS — Z882 Allergy status to sulfonamides status: Secondary | ICD-10-CM

## 2021-08-11 DIAGNOSIS — G936 Cerebral edema: Secondary | ICD-10-CM | POA: Diagnosis present

## 2021-08-11 DIAGNOSIS — Z8673 Personal history of transient ischemic attack (TIA), and cerebral infarction without residual deficits: Secondary | ICD-10-CM | POA: Diagnosis not present

## 2021-08-11 DIAGNOSIS — F0284 Dementia in other diseases classified elsewhere, unspecified severity, with anxiety: Secondary | ICD-10-CM | POA: Diagnosis not present

## 2021-08-11 DIAGNOSIS — I69354 Hemiplegia and hemiparesis following cerebral infarction affecting left non-dominant side: Secondary | ICD-10-CM | POA: Diagnosis not present

## 2021-08-11 DIAGNOSIS — Z7982 Long term (current) use of aspirin: Secondary | ICD-10-CM

## 2021-08-11 DIAGNOSIS — Z7401 Bed confinement status: Secondary | ICD-10-CM | POA: Diagnosis not present

## 2021-08-11 DIAGNOSIS — I69328 Other speech and language deficits following cerebral infarction: Secondary | ICD-10-CM | POA: Diagnosis not present

## 2021-08-11 DIAGNOSIS — D649 Anemia, unspecified: Secondary | ICD-10-CM | POA: Diagnosis not present

## 2021-08-11 DIAGNOSIS — I161 Hypertensive emergency: Secondary | ICD-10-CM | POA: Diagnosis present

## 2021-08-11 DIAGNOSIS — I160 Hypertensive urgency: Secondary | ICD-10-CM | POA: Diagnosis not present

## 2021-08-11 DIAGNOSIS — R52 Pain, unspecified: Secondary | ICD-10-CM | POA: Diagnosis not present

## 2021-08-11 DIAGNOSIS — Z9282 Status post administration of tPA (rtPA) in a different facility within the last 24 hours prior to admission to current facility: Secondary | ICD-10-CM | POA: Diagnosis not present

## 2021-08-11 DIAGNOSIS — G319 Degenerative disease of nervous system, unspecified: Secondary | ICD-10-CM | POA: Diagnosis not present

## 2021-08-11 DIAGNOSIS — I63412 Cerebral infarction due to embolism of left middle cerebral artery: Secondary | ICD-10-CM

## 2021-08-11 DIAGNOSIS — R627 Adult failure to thrive: Secondary | ICD-10-CM | POA: Diagnosis present

## 2021-08-11 DIAGNOSIS — G459 Transient cerebral ischemic attack, unspecified: Secondary | ICD-10-CM | POA: Diagnosis not present

## 2021-08-11 DIAGNOSIS — M6281 Muscle weakness (generalized): Secondary | ICD-10-CM | POA: Diagnosis not present

## 2021-08-11 DIAGNOSIS — Z6825 Body mass index (BMI) 25.0-25.9, adult: Secondary | ICD-10-CM

## 2021-08-11 DIAGNOSIS — R509 Fever, unspecified: Secondary | ICD-10-CM | POA: Diagnosis not present

## 2021-08-11 DIAGNOSIS — R4182 Altered mental status, unspecified: Secondary | ICD-10-CM | POA: Diagnosis not present

## 2021-08-11 DIAGNOSIS — J309 Allergic rhinitis, unspecified: Secondary | ICD-10-CM | POA: Diagnosis not present

## 2021-08-11 DIAGNOSIS — R1311 Dysphagia, oral phase: Secondary | ICD-10-CM | POA: Diagnosis present

## 2021-08-11 DIAGNOSIS — N390 Urinary tract infection, site not specified: Secondary | ICD-10-CM | POA: Diagnosis present

## 2021-08-11 DIAGNOSIS — Z7984 Long term (current) use of oral hypoglycemic drugs: Secondary | ICD-10-CM | POA: Diagnosis not present

## 2021-08-11 DIAGNOSIS — I69321 Dysphasia following cerebral infarction: Secondary | ICD-10-CM | POA: Diagnosis not present

## 2021-08-11 DIAGNOSIS — F0280 Dementia in other diseases classified elsewhere without behavioral disturbance: Secondary | ICD-10-CM | POA: Diagnosis present

## 2021-08-11 DIAGNOSIS — Z79899 Other long term (current) drug therapy: Secondary | ICD-10-CM | POA: Diagnosis not present

## 2021-08-11 DIAGNOSIS — E1122 Type 2 diabetes mellitus with diabetic chronic kidney disease: Secondary | ICD-10-CM | POA: Diagnosis not present

## 2021-08-11 DIAGNOSIS — E1165 Type 2 diabetes mellitus with hyperglycemia: Secondary | ICD-10-CM | POA: Diagnosis present

## 2021-08-11 DIAGNOSIS — I63211 Cerebral infarction due to unspecified occlusion or stenosis of right vertebral arteries: Principal | ICD-10-CM | POA: Diagnosis present

## 2021-08-11 DIAGNOSIS — I10 Essential (primary) hypertension: Secondary | ICD-10-CM | POA: Diagnosis present

## 2021-08-11 DIAGNOSIS — J9811 Atelectasis: Secondary | ICD-10-CM | POA: Diagnosis not present

## 2021-08-11 DIAGNOSIS — I6603 Occlusion and stenosis of bilateral middle cerebral arteries: Secondary | ICD-10-CM | POA: Diagnosis not present

## 2021-08-11 DIAGNOSIS — I119 Hypertensive heart disease without heart failure: Secondary | ICD-10-CM | POA: Diagnosis not present

## 2021-08-11 DIAGNOSIS — Z741 Need for assistance with personal care: Secondary | ICD-10-CM | POA: Diagnosis not present

## 2021-08-11 DIAGNOSIS — K219 Gastro-esophageal reflux disease without esophagitis: Secondary | ICD-10-CM | POA: Diagnosis not present

## 2021-08-11 DIAGNOSIS — F339 Major depressive disorder, recurrent, unspecified: Secondary | ICD-10-CM | POA: Diagnosis not present

## 2021-08-11 DIAGNOSIS — F411 Generalized anxiety disorder: Secondary | ICD-10-CM | POA: Diagnosis not present

## 2021-08-11 DIAGNOSIS — I610 Nontraumatic intracerebral hemorrhage in hemisphere, subcortical: Secondary | ICD-10-CM | POA: Diagnosis not present

## 2021-08-11 DIAGNOSIS — I69398 Other sequelae of cerebral infarction: Secondary | ICD-10-CM | POA: Diagnosis not present

## 2021-08-11 DIAGNOSIS — R29728 NIHSS score 28: Secondary | ICD-10-CM | POA: Diagnosis present

## 2021-08-11 DIAGNOSIS — M2550 Pain in unspecified joint: Secondary | ICD-10-CM | POA: Diagnosis not present

## 2021-08-11 DIAGNOSIS — E559 Vitamin D deficiency, unspecified: Secondary | ICD-10-CM | POA: Diagnosis not present

## 2021-08-11 DIAGNOSIS — I619 Nontraumatic intracerebral hemorrhage, unspecified: Secondary | ICD-10-CM | POA: Diagnosis not present

## 2021-08-11 DIAGNOSIS — R638 Other symptoms and signs concerning food and fluid intake: Secondary | ICD-10-CM | POA: Diagnosis not present

## 2021-08-11 DIAGNOSIS — R4701 Aphasia: Secondary | ICD-10-CM | POA: Diagnosis present

## 2021-08-11 DIAGNOSIS — W19XXXA Unspecified fall, initial encounter: Secondary | ICD-10-CM | POA: Diagnosis not present

## 2021-08-11 DIAGNOSIS — E1169 Type 2 diabetes mellitus with other specified complication: Secondary | ICD-10-CM | POA: Diagnosis not present

## 2021-08-11 DIAGNOSIS — R279 Unspecified lack of coordination: Secondary | ICD-10-CM | POA: Diagnosis not present

## 2021-08-11 DIAGNOSIS — R404 Transient alteration of awareness: Secondary | ICD-10-CM | POA: Diagnosis not present

## 2021-08-11 DIAGNOSIS — I6523 Occlusion and stenosis of bilateral carotid arteries: Secondary | ICD-10-CM | POA: Diagnosis not present

## 2021-08-11 LAB — PROTIME-INR
INR: 1.1 (ref 0.8–1.2)
INR: 1.1 (ref 0.8–1.2)
Prothrombin Time: 14.2 seconds (ref 11.4–15.2)
Prothrombin Time: 14.5 seconds (ref 11.4–15.2)

## 2021-08-11 LAB — COMPREHENSIVE METABOLIC PANEL
ALT: 20 U/L (ref 0–44)
AST: 25 U/L (ref 15–41)
Albumin: 4 g/dL (ref 3.5–5.0)
Alkaline Phosphatase: 64 U/L (ref 38–126)
Anion gap: 8 (ref 5–15)
BUN: 21 mg/dL (ref 8–23)
CO2: 24 mmol/L (ref 22–32)
Calcium: 9.5 mg/dL (ref 8.9–10.3)
Chloride: 108 mmol/L (ref 98–111)
Creatinine, Ser: 1.06 mg/dL — ABNORMAL HIGH (ref 0.44–1.00)
GFR, Estimated: 50 mL/min — ABNORMAL LOW (ref >60)
Glucose, Bld: 75 mg/dL (ref 70–99)
Potassium: 4 mmol/L (ref 3.5–5.1)
Sodium: 140 mmol/L (ref 135–145)
Total Bilirubin: 0.8 mg/dL (ref 0.3–1.2)
Total Protein: 7.1 g/dL (ref 6.5–8.1)

## 2021-08-11 LAB — CBC
HCT: 35.5 % — ABNORMAL LOW (ref 36.0–46.0)
Hemoglobin: 11.4 g/dL — ABNORMAL LOW (ref 12.0–15.0)
MCH: 28.5 pg (ref 26.0–34.0)
MCHC: 32.1 g/dL (ref 30.0–36.0)
MCV: 88.8 fL (ref 80.0–100.0)
Platelets: 236 10*3/uL (ref 150–400)
RBC: 4 MIL/uL (ref 3.87–5.11)
RDW: 13.2 % (ref 11.5–15.5)
WBC: 9.3 10*3/uL (ref 4.0–10.5)
nRBC: 0 % (ref 0.0–0.2)

## 2021-08-11 LAB — TYPE AND SCREEN
ABO/RH(D): O NEG
ABO/RH(D): O NEG
Antibody Screen: NEGATIVE
Antibody Screen: NEGATIVE

## 2021-08-11 LAB — DIFFERENTIAL
Abs Immature Granulocytes: 0.05 10*3/uL (ref 0.00–0.07)
Basophils Absolute: 0.1 10*3/uL (ref 0.0–0.1)
Basophils Relative: 1 %
Eosinophils Absolute: 0.2 10*3/uL (ref 0.0–0.5)
Eosinophils Relative: 2 %
Immature Granulocytes: 1 %
Lymphocytes Relative: 14 %
Lymphs Abs: 1.3 10*3/uL (ref 0.7–4.0)
Monocytes Absolute: 0.6 10*3/uL (ref 0.1–1.0)
Monocytes Relative: 7 %
Neutro Abs: 7.1 10*3/uL (ref 1.7–7.7)
Neutrophils Relative %: 75 %

## 2021-08-11 LAB — ABO/RH: ABO/RH(D): O NEG

## 2021-08-11 LAB — APTT
aPTT: 25 seconds (ref 24–36)
aPTT: 27 seconds (ref 24–36)

## 2021-08-11 LAB — RESP PANEL BY RT-PCR (FLU A&B, COVID) ARPGX2
Influenza A by PCR: NEGATIVE
Influenza B by PCR: NEGATIVE
SARS Coronavirus 2 by RT PCR: NEGATIVE

## 2021-08-11 LAB — FIBRINOGEN: Fibrinogen: 513 mg/dL — ABNORMAL HIGH (ref 210–475)

## 2021-08-11 LAB — ETHANOL: Alcohol, Ethyl (B): <10 mg/dL (ref <10)

## 2021-08-11 IMAGING — CT CT HEAD W/O CM
4 series · 14 of 47 positions shown, 16 images · non-contrast
Comparison: Earlier exam of [DATE]

CLINICAL DATA: Follow-up stroke, less responsive,



[Series 2: head ax w o · axial · 0.35mm/px · z∈[-17,+103]mm · 7 of 33 slices shown, 9 images]
[im 5/33  brain]
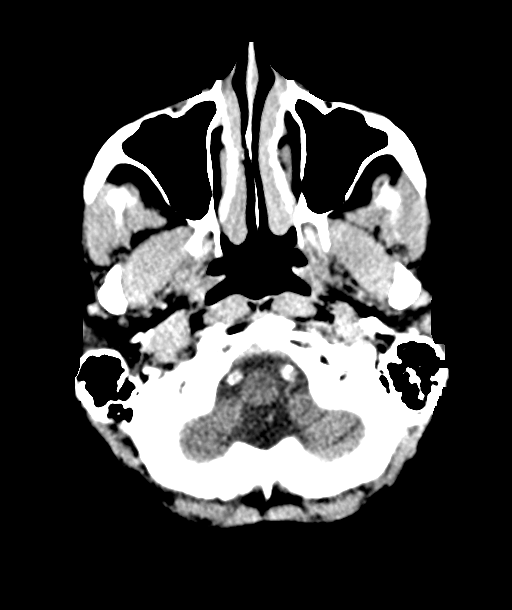
[im 5/33  bone]
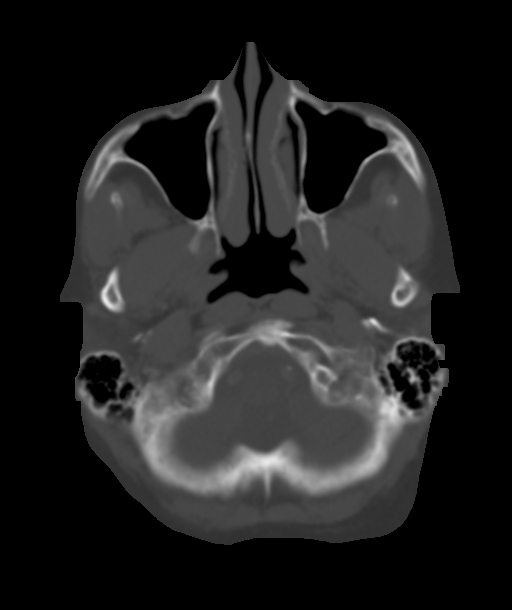
[im 9/33  brain]
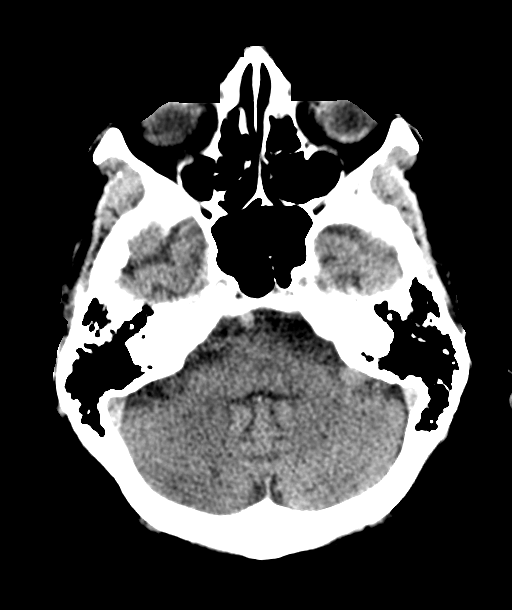
[im 13/33  brain]
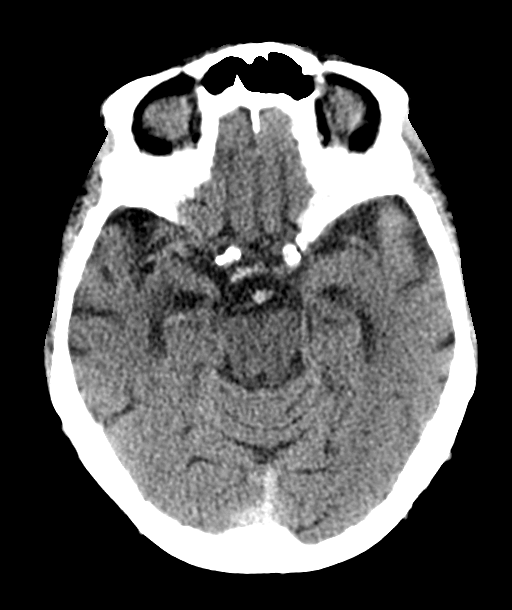
[im 17/33  brain]
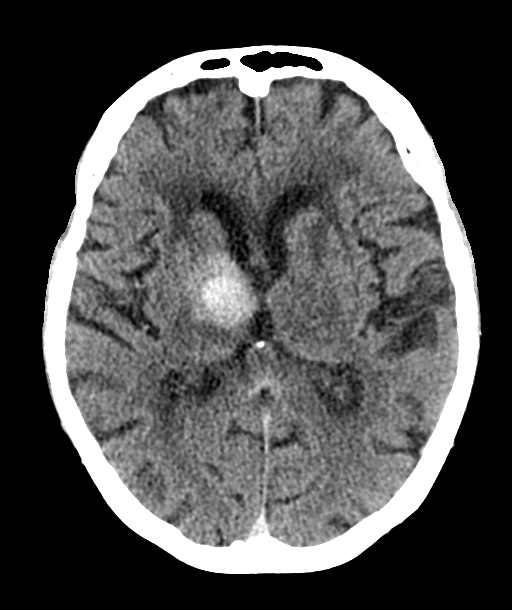
[im 21/33  brain]
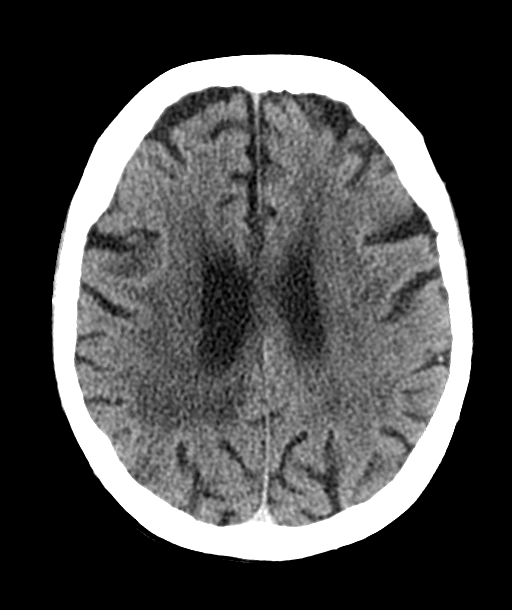
[im 21/33  bone]
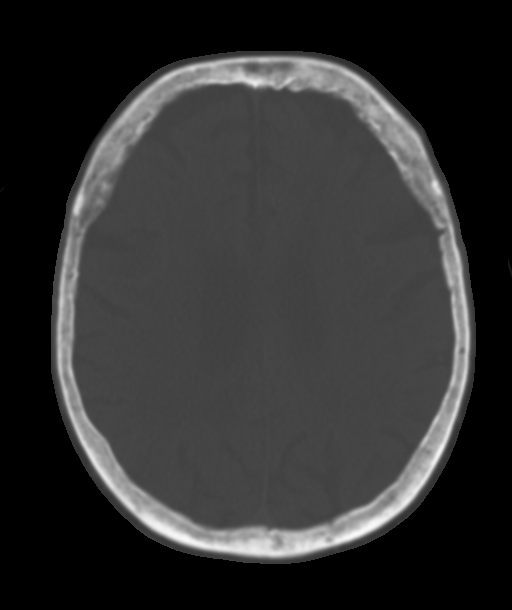
[im 25/33  brain]
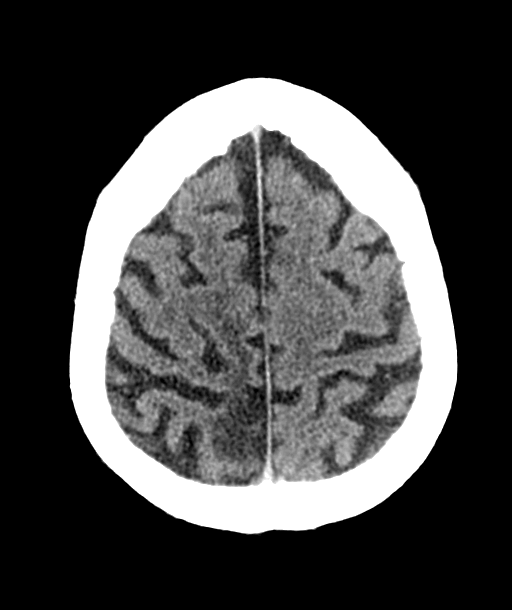
[im 29/33  brain]
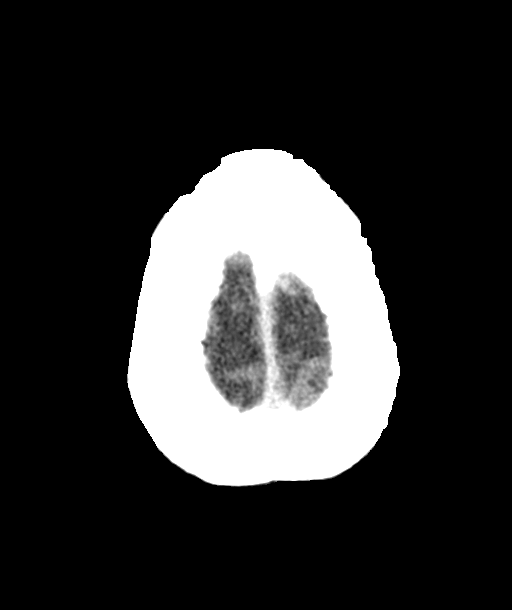

[Series 3: head bone · axial · 0.43mm/px · 1 of 79 slices shown]
[im 8/79  bone]
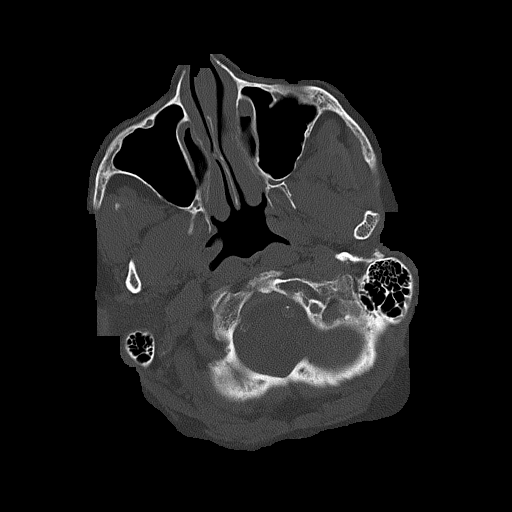

[Series 4: coronal soft · coronal · 0.31mm/px · 3 of 71 slices shown]
[im 24/71  brain]
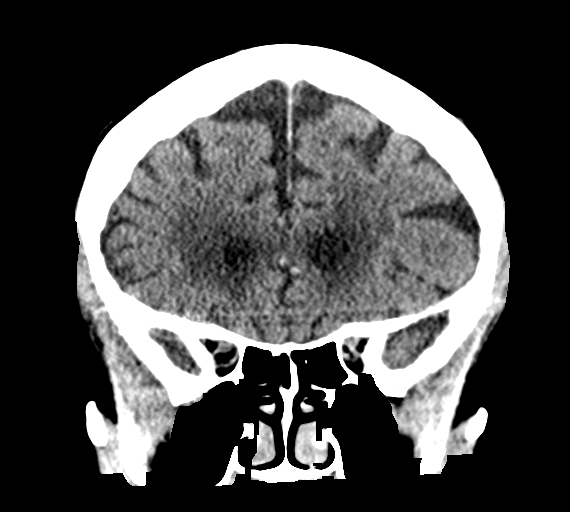
[im 32/71  brain]
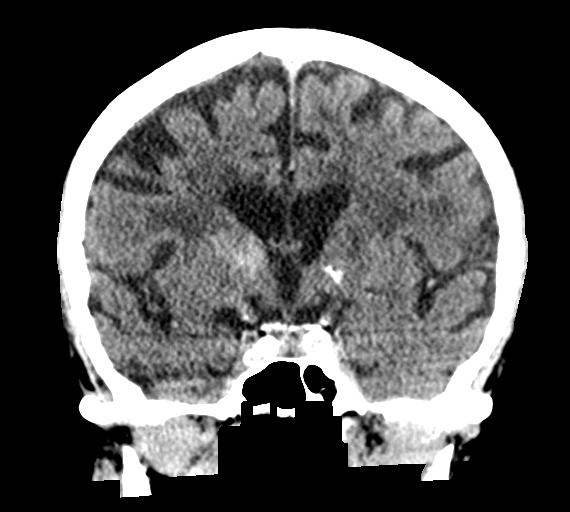
[im 39/71  brain]
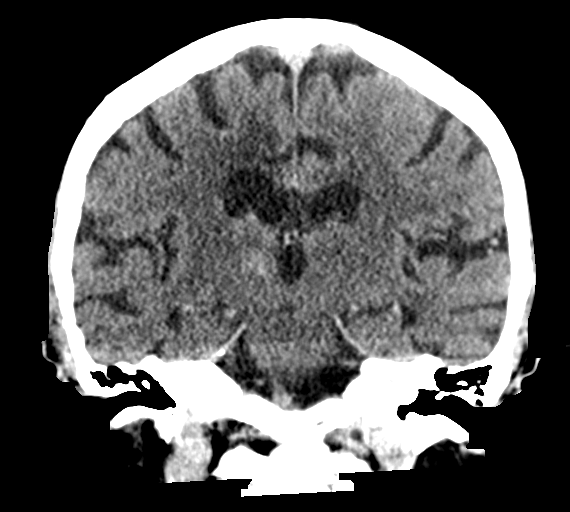

[Series 5: sagittal soft · sagittal · 0.31mm/px · 3 of 58 slices shown]
[im 20/58  brain]
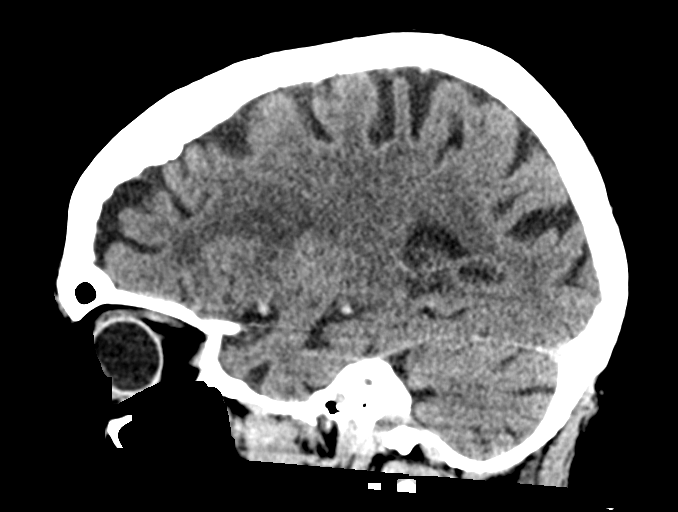
[im 29/58  brain]
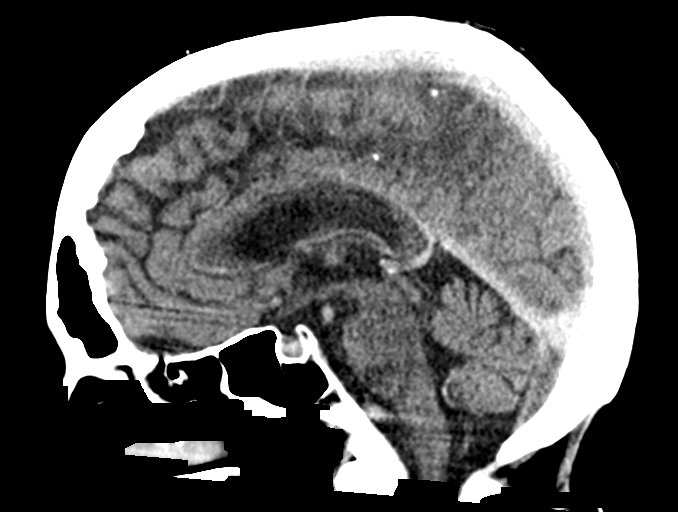
[im 39/58  brain]
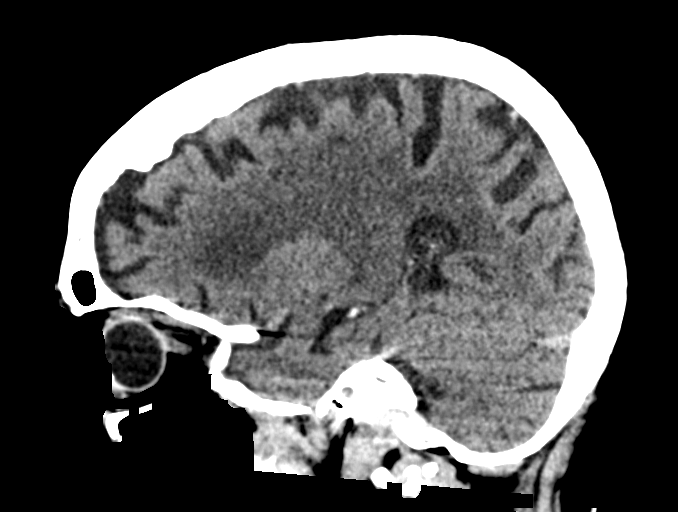

[14 of 47 positions shown; findings below may reference images not displayed]

FINDINGS: Brain: Interval acute hemorrhage within the RIGHT thalamus extending
into internal capsule, area of hemorrhage measuring 2.7 x 1.9 x
cm. Underlying atrophy and small vessel chronic ischemic changes. No
additional hemorrhage or infarct. No extra-axial fluid collections.
No midline shift or significant mass effect.

Vascular: Atherosclerotic calcification of internal carotid arteries
at skull base mild residual contrast within the vessels from earlier
CTA.

Skull: Intact, demineralized

Sinuses/Orbits: Clear

Other: N/A
IMPRESSION: Interval acute hemorrhage within the RIGHT thalamus extending into
internal capsule measuring 2.7 x 1.9 x 1.6 cm.

Atrophy with small vessel chronic ischemic changes.

Critical Value/emergent results were discussed with Dr. ROSIMARIA on

## 2021-08-11 IMAGING — DX DG CHEST 1V PORT
1 series · 1 of 1 positions shown · non-contrast
Comparison: [DATE]

CLINICAL DATA: Fever.

EXAM:
PORTABLE CHEST 1 VIEW

[chest ap]
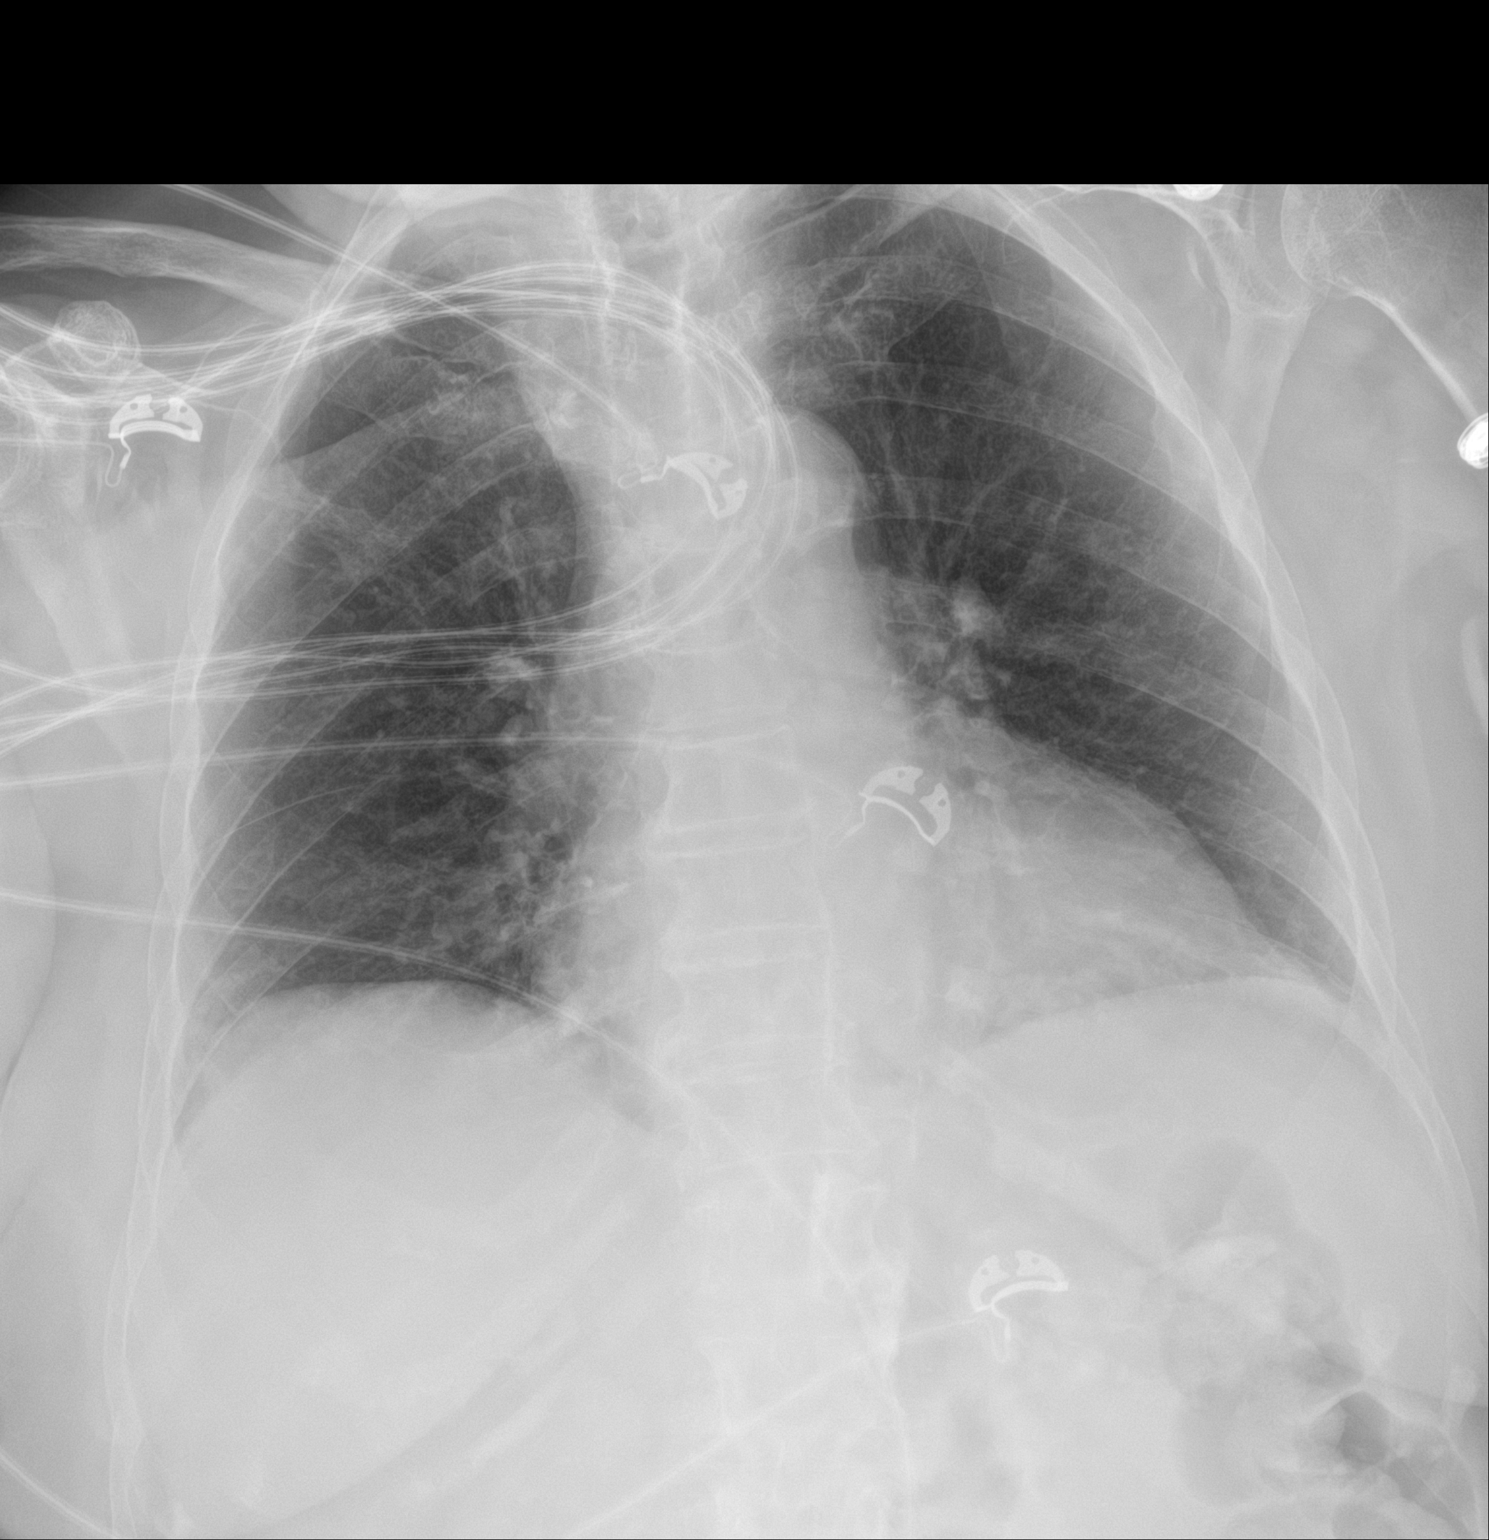

[1 of 1 positions shown; findings below may reference images not displayed]

FINDINGS: The cardiac silhouette is upper limits of normal in size. Aortic
atherosclerosis is noted. Telemetry leads overlie the right
hemithorax. The lungs are hypoinflated with minimal bibasilar
opacities compatible with atelectasis. No edema, sizable pleural
effusion, or pneumothorax is identified. No acute osseous
abnormality is seen.
IMPRESSION: Minimal bibasilar atelectasis.

## 2021-08-11 IMAGING — CT CT HEAD W/O CM
4 series · 16 of 47 positions shown, 18 images · non-contrast
Comparison: CT from earlier the same day.

CLINICAL DATA: Follow-up examination for stroke.



[Series 3: head wo · axial · 0.41mm/px · z∈[-127,-12]mm · 7 of 31 slices shown, 9 images]
[im 4/31  brain]
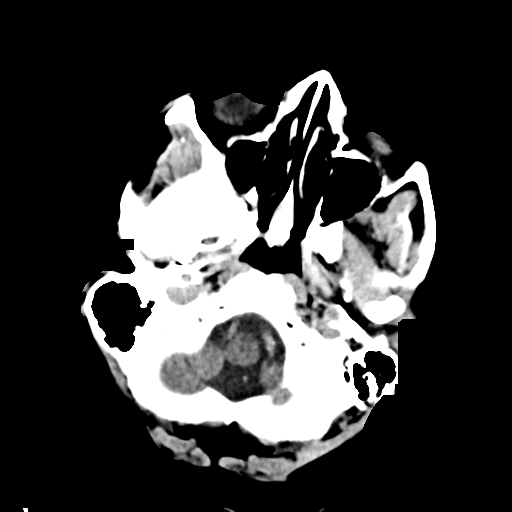
[im 4/31  bone]
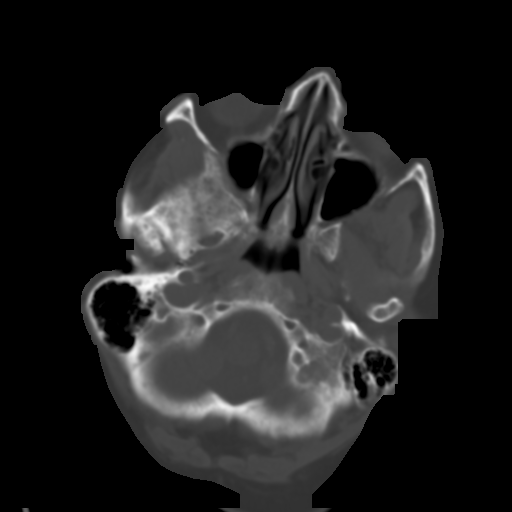
[im 8/31  brain]
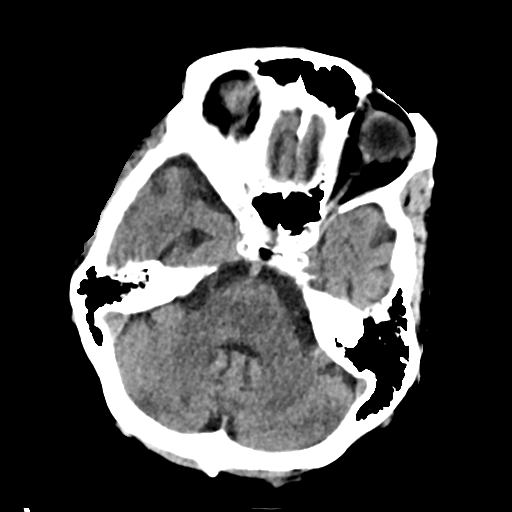
[im 12/31  brain]
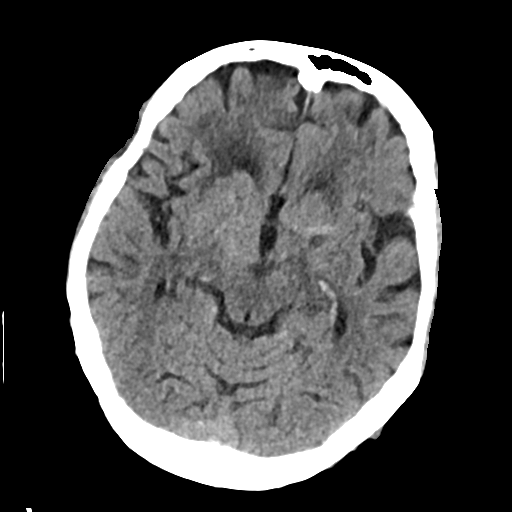
[im 16/31  brain]
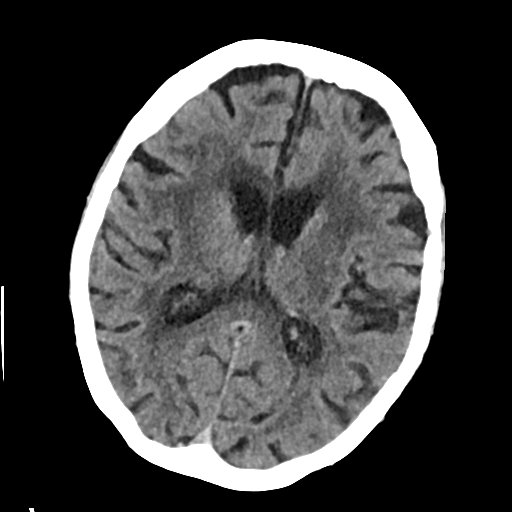
[im 19/31  brain]
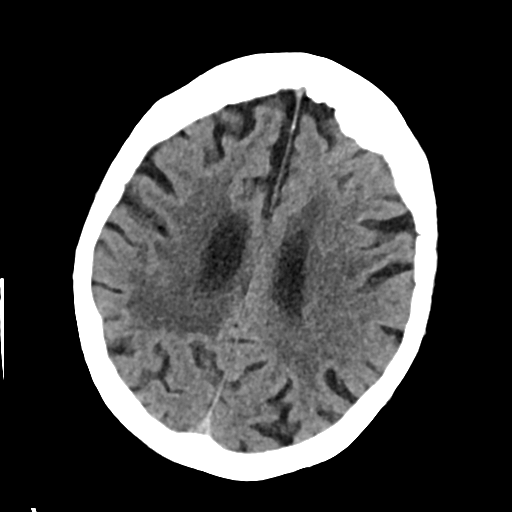
[im 19/31  bone]
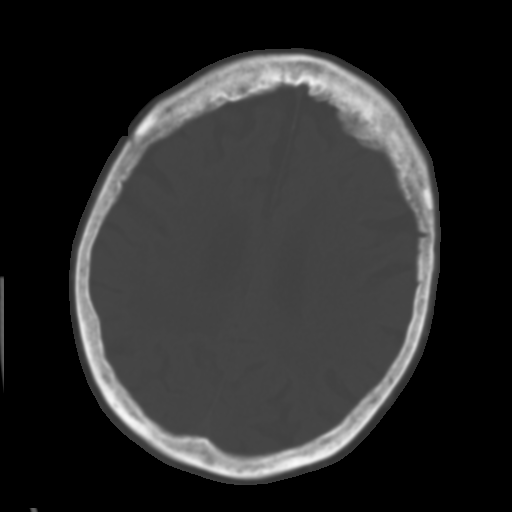
[im 23/31  brain]
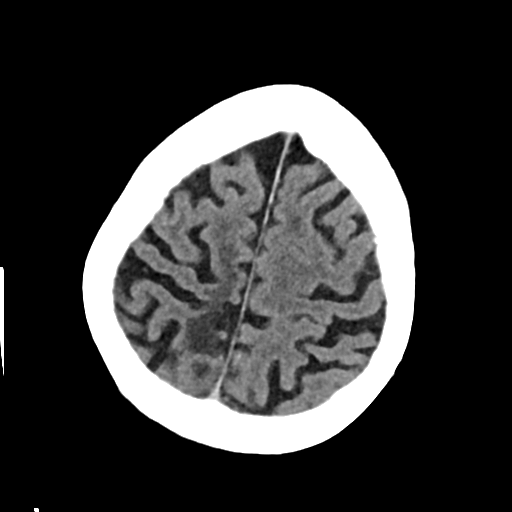
[im 27/31  brain]
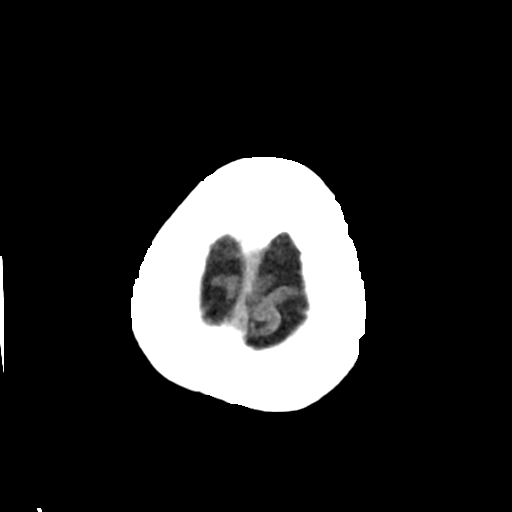

[Series 4: head bone · axial · 0.41mm/px · z∈[-128,-96]mm · 3 of 78 slices shown]
[im 8/78  bone]
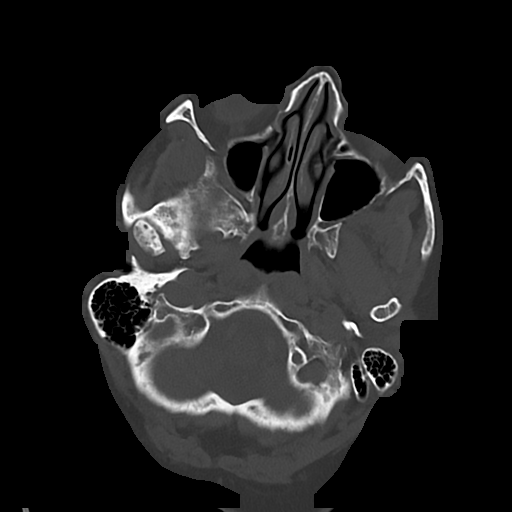
[im 16/78  bone]
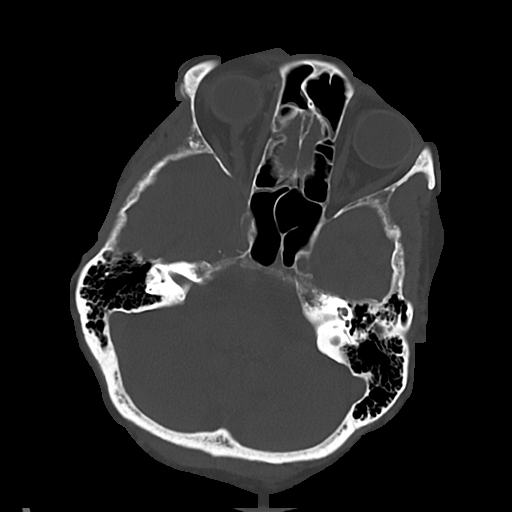
[im 24/78  bone]
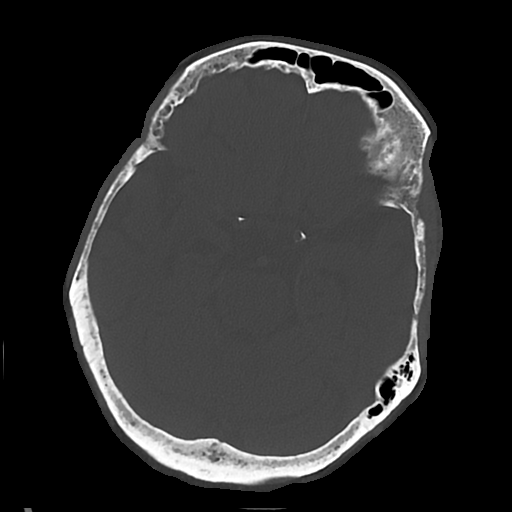

[Series 5: cor soft · coronal · 0.37mm/px · 3 of 70 slices shown]
[im 24/70  brain]
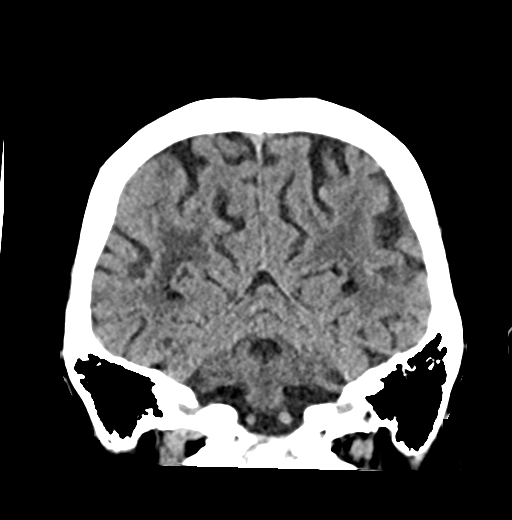
[im 31/70  brain]
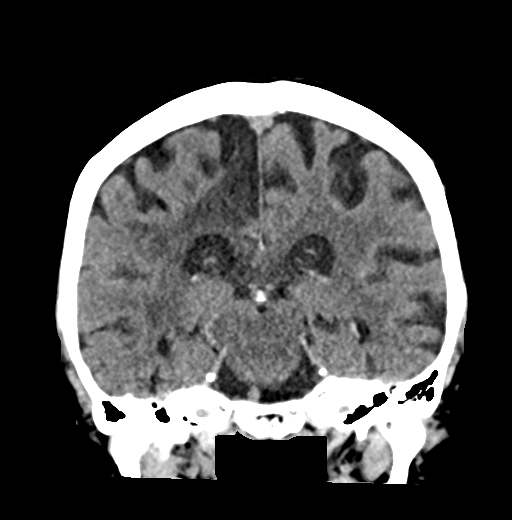
[im 39/70  brain]
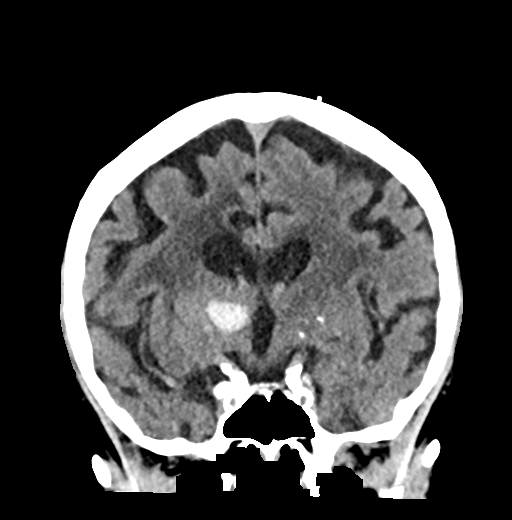

[Series 6: sag soft · sagittal · 0.31mm/px · 3 of 63 slices shown]
[im 21/63  brain]
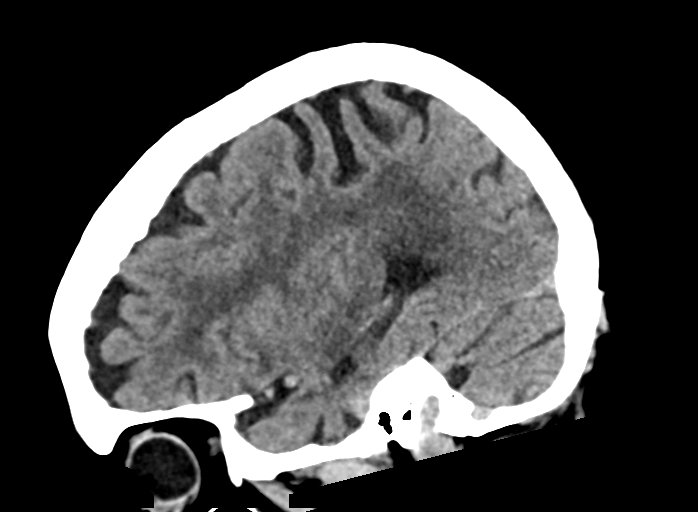
[im 32/63  brain]
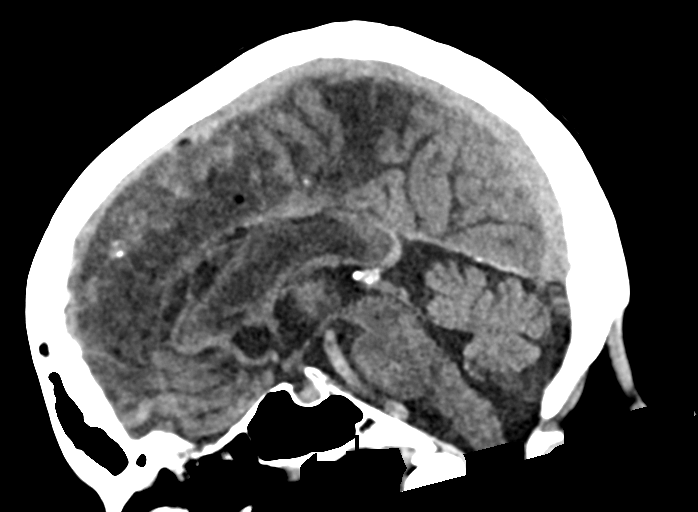
[im 42/63  brain]
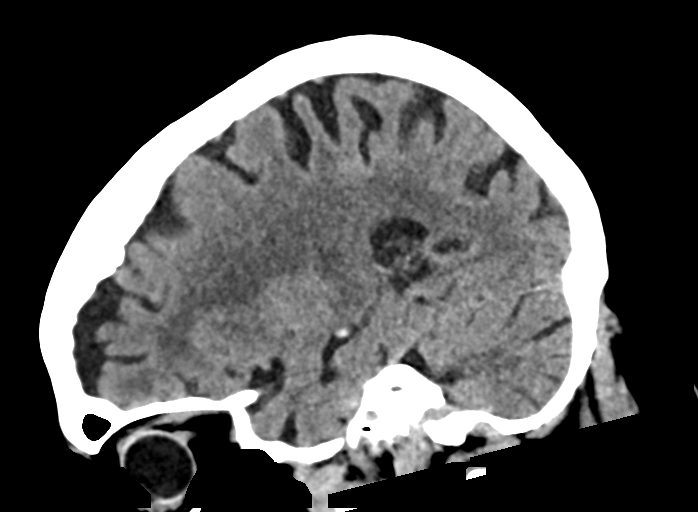

[16 of 47 positions shown; findings below may reference images not displayed]

FINDINGS: Brain: Previously identified intraparenchymal hemorrhage centered at
the right thalamus again seen, measuring 2.4 x 1.9 x 1.5 cm
(estimated volume 3.5 mL). This is relatively similar to previous.
Minimal localized edema without significant regional mass effect. No
intraventricular extension or other complicating features. No
midline shift.

No other acute intracranial hemorrhage or large vessel territory
infarct. Underlying atrophy with chronic microvascular ischemic
disease again noted. Chronic posterior right ACA distribution
infarct noted as well. No mass lesion or extra-axial fluid
collection.

Vascular: Calcified atherosclerosis present at the skull base.
Probable small lateral residual contrast within the intracranial
vasculature. No convincing new hyperdense vessel.

Skull: Scalp soft tissues and calvarium demonstrate no new finding.

Sinuses/Orbits: Globes orbital soft tissues demonstrate no acute
finding. Mild mucosal thickening noted within the ethmoidal air
cells. Paranasal sinuses are otherwise clear. No mastoid effusion.

Other: None.
IMPRESSION: 1. No significant interval change in size of right thalamic
intraparenchymal hemorrhage, measuring 2.4 x 1.9 x 1.5 cm (estimated
volume 3.5 mL). Minimal localized edema without significant regional
mass effect. No intraventricular extension or other complicating
features.
2. No other new acute intracranial abnormality.

## 2021-08-11 IMAGING — CT CT HEAD CODE STROKE
3 of 4 series · 15 of 47 positions shown, 18 images · non-contrast
Comparison: [DATE].

CLINICAL DATA: Code stroke.  Neuro deficit, acute, stroke suspected



[Series 3: head w o · axial · 0.45mm/px · z∈[+17,+147]mm · 9 of 32 slices shown, 12 images]
[im 3/32  brain]
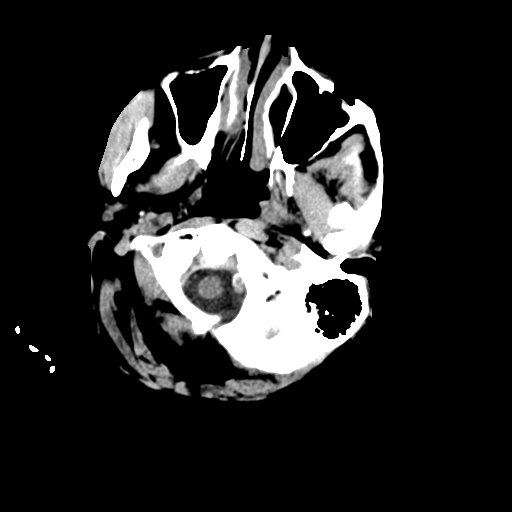
[im 3/32  bone]
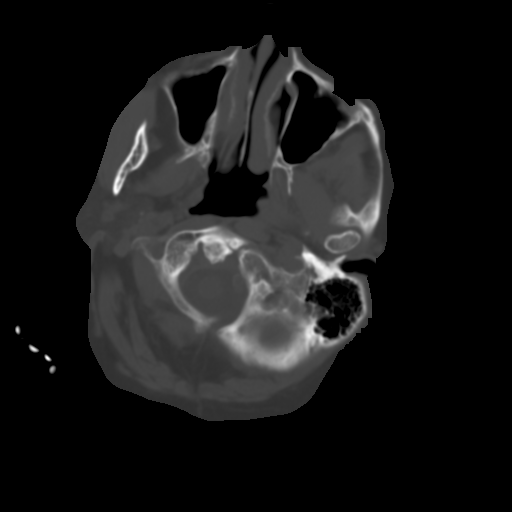
[im 7/32  brain]
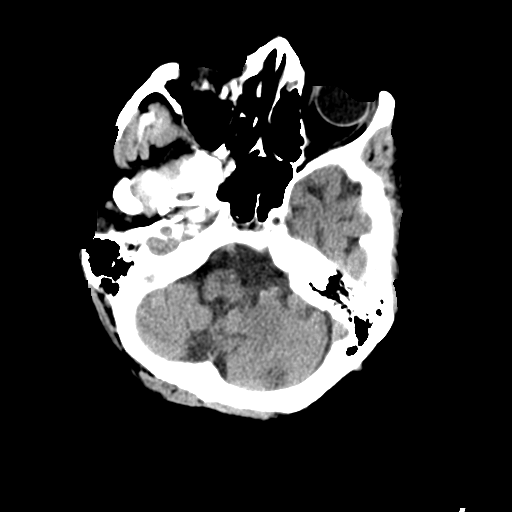
[im 9/32  brain]
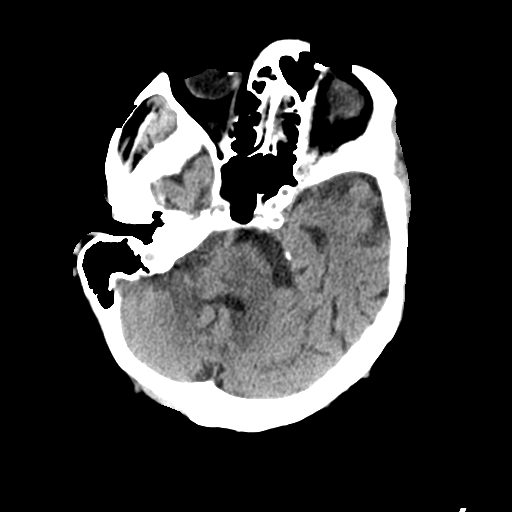
[im 14/32  brain]
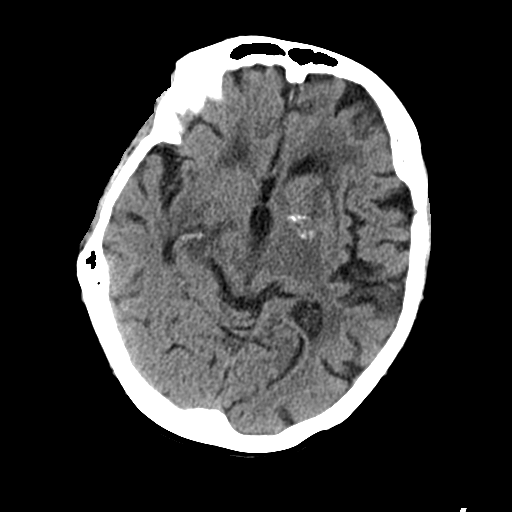
[im 16/32  brain]
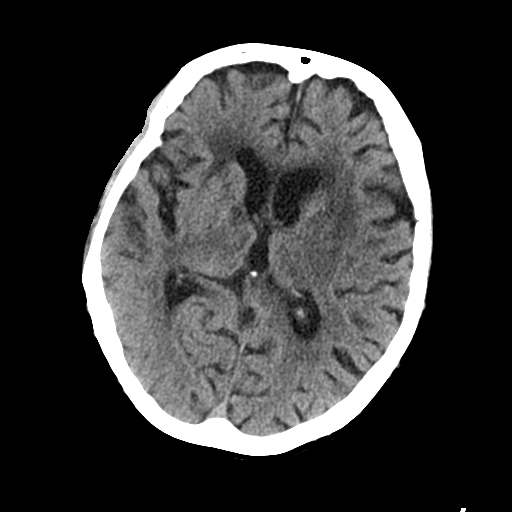
[im 16/32  bone]
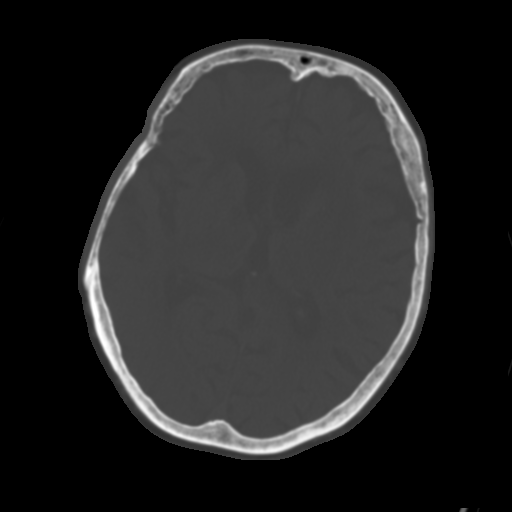
[im 18/32  brain]
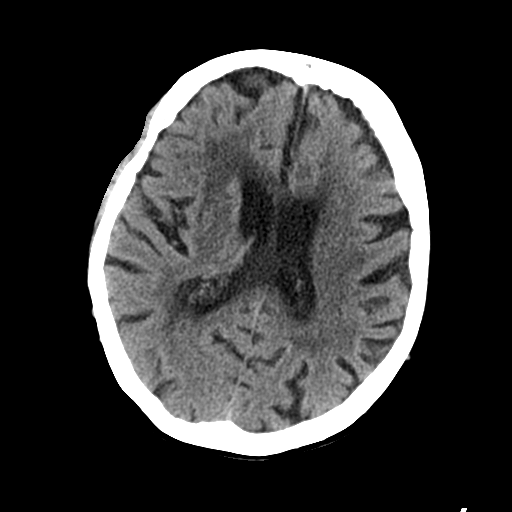
[im 23/32  brain]
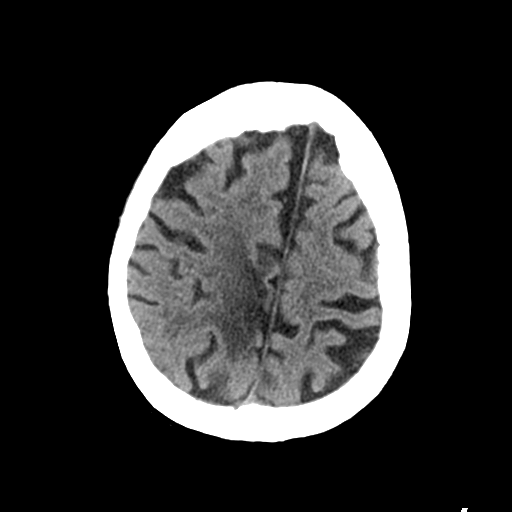
[im 25/32  brain]
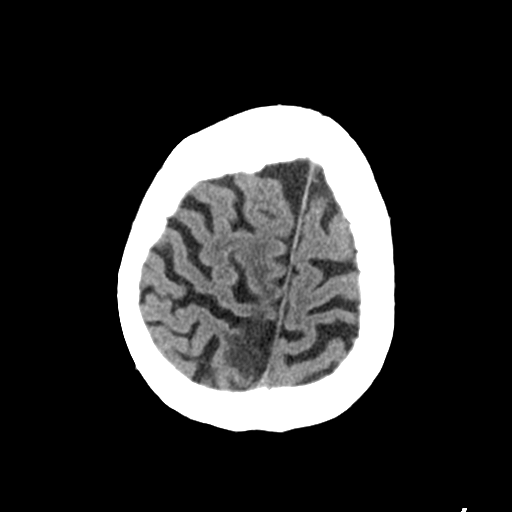
[im 29/32  brain]
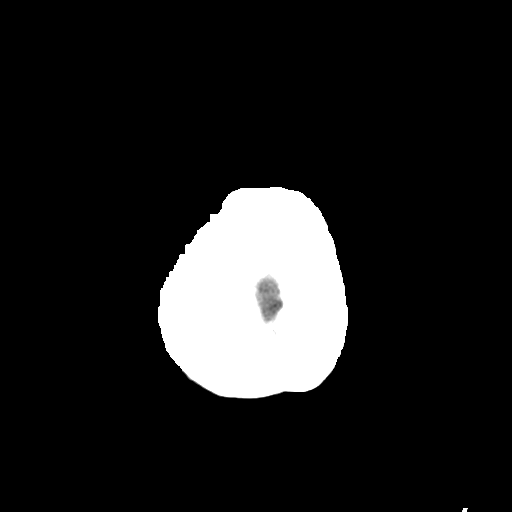
[im 29/32  bone]
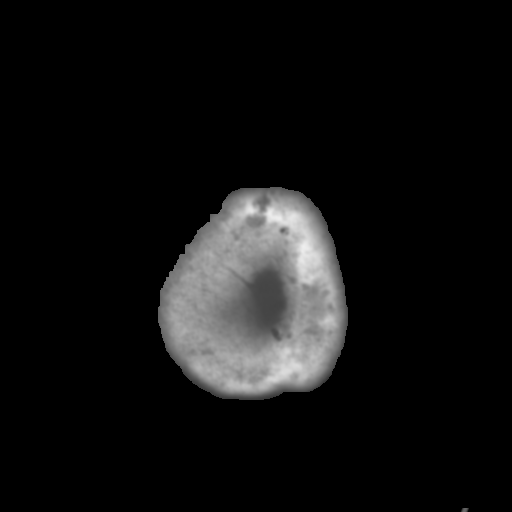

[Series 5: coronal soft · coronal · 0.32mm/px · 3 of 67 slices shown]
[im 23/67  brain]
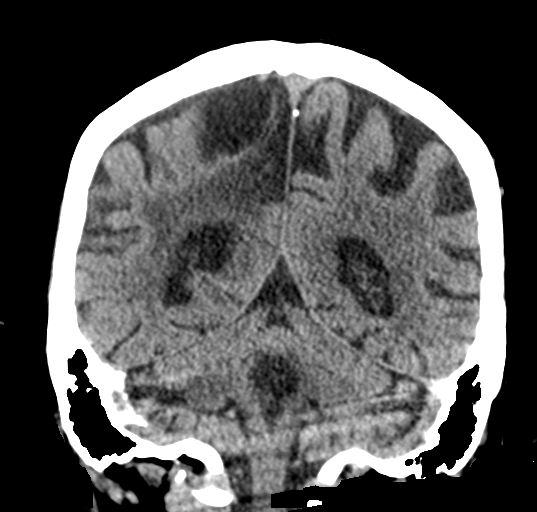
[im 30/67  brain]
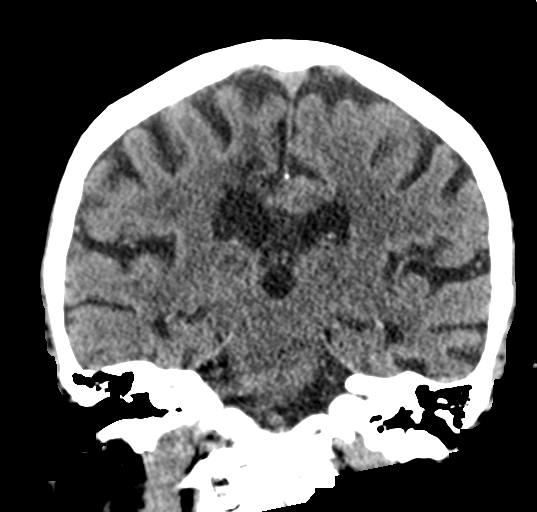
[im 37/67  brain]
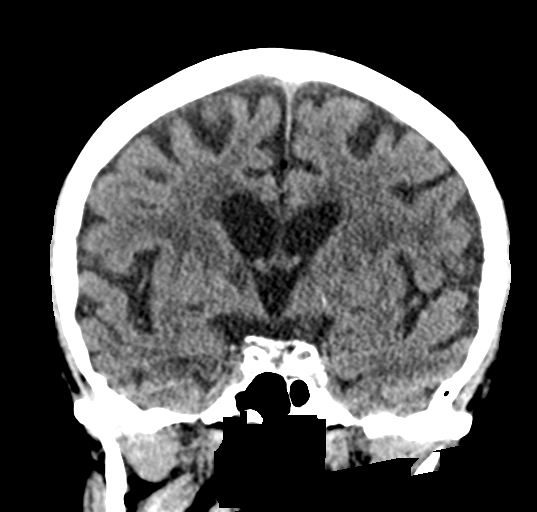

[Series 6: sagittal soft · sagittal · 0.32mm/px · 3 of 58 slices shown]
[im 25/58  brain]
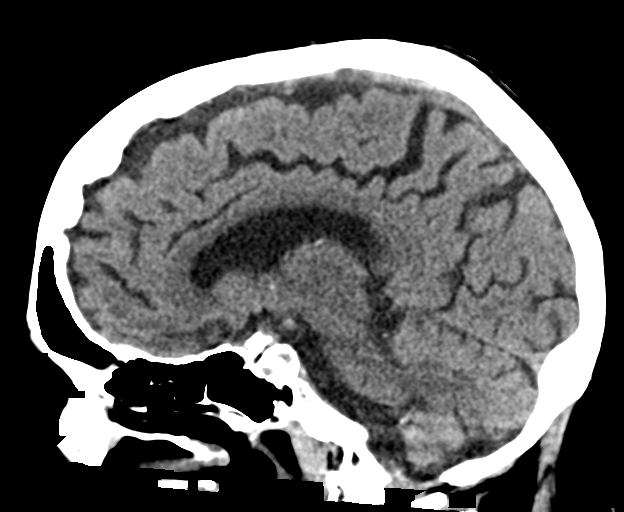
[im 29/58  brain]
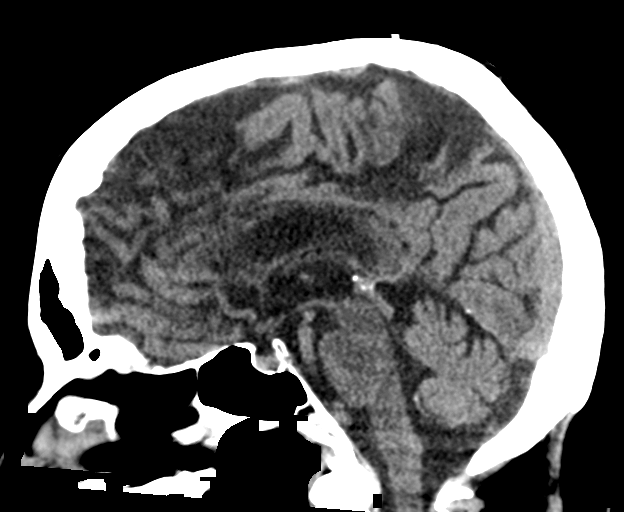
[im 34/58  brain]
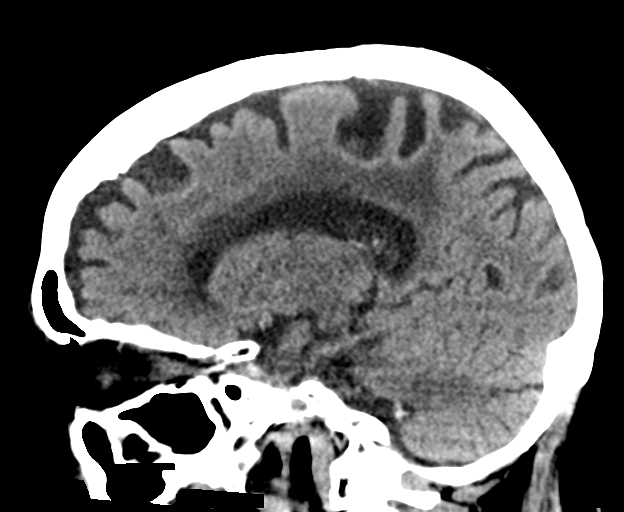

[15 of 47 positions shown; findings below may reference images not displayed]

FINDINGS: Brain: Remote right frontal parietal infarct. Slightly increased
hypoattenuation in the right thalamus. Otherwise, no evidence of
acute infarct by CT. Additional advanced patchy and confluent white
matter hypoattenuation, nonspecific but compatible chronic
microvascular ischemic disease. No mass lesion, midline shift,
hydrocephalus, or acute hemorrhage.

Vascular: No hyperdense vessel identified. Calcific intracranial
atherosclerosis.

Skull: No acute fracture.

Sinuses/Orbits: Clear sinuses.  Unremarkable orbits.

Other: No mastoid effusions.
IMPRESSION: 1. Slightly increased hypoattenuation in the right thalamus. This
could represent the sequela of advanced chronic microvascular
ischemic disease, but acute infarct is difficult to exclude. An MRI
could further evaluate if clinically indicated.
2. No acute hemorrhage.
3. Remote right frontoparietal infarct and similar advanced chronic
microvascular ischemic disease. An MRI could provide more sensitive
evaluation for acute infarct.

Findings discussed with Dr. JIM via telephone at [DATE] a.m.

## 2021-08-11 IMAGING — CT CT ANGIO HEAD-NECK (W OR W/O PERF)
3 of 7 series · 10 of 36 positions shown · IV contrast (Omnipaque or Isovue)
Comparison: MRA [DATE].  Same day CT code stroke.

CLINICAL DATA: Stroke/TIA, determine embolic source

EXAM:
CT ANGIOGRAPHY HEAD AND NECK
TECHNIQUE: Multidetector CT imaging of the head and neck was performed using
the standard protocol during bolus administration of intravenous
contrast. Multiplanar CT image reconstructions and MIPs were
obtained to evaluate the vascular anatomy. Carotid stenosis
measurements (when applicable) are obtained utilizing NASCET
criteria, using the distal internal carotid diameter as the
denominator.

[Series 4: cta head & neck · axial · 0.51mm/px · z∈[-89,+15]mm · 2 of 157 slices shown]
[im 53/157  soft-tissue]
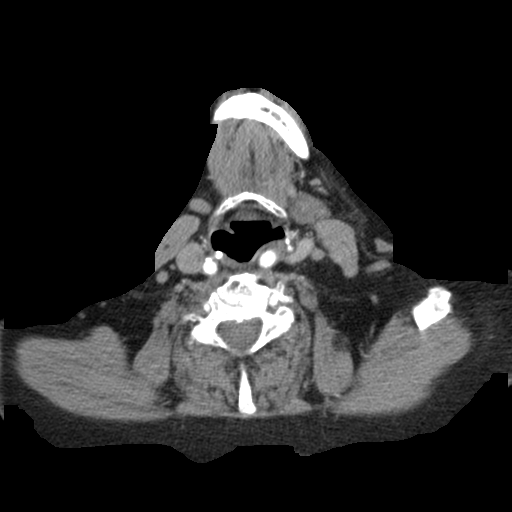
[im 105/157  soft-tissue]
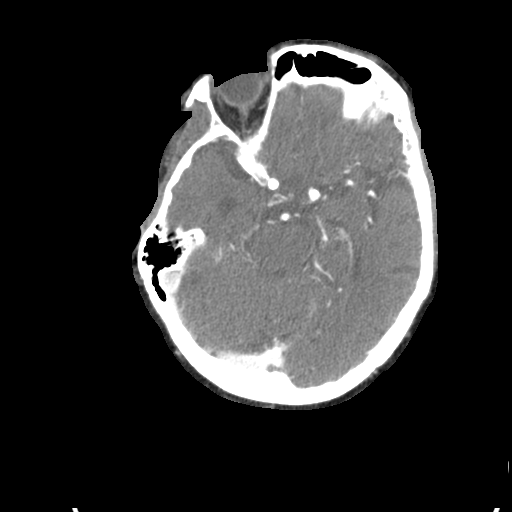

[Series 6: ax thins · axial · 0.45mm/px · z∈[-145,+80]mm · 6 of 315 slices shown]
[im 45/315  soft-tissue]
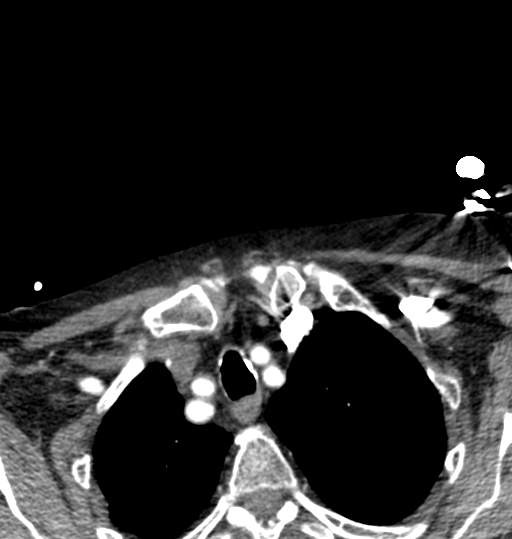
[im 90/315  bone]
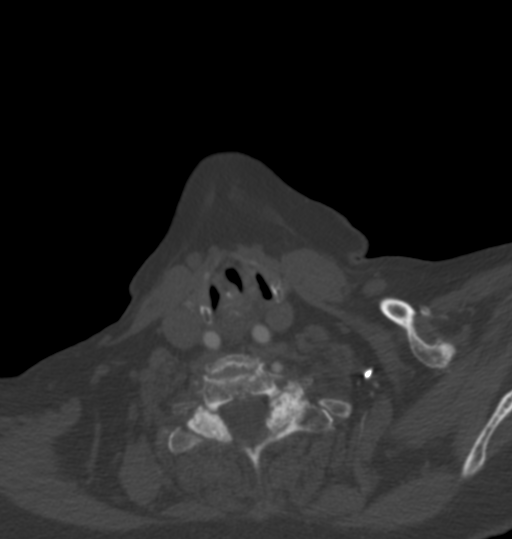
[im 135/315  soft-tissue]
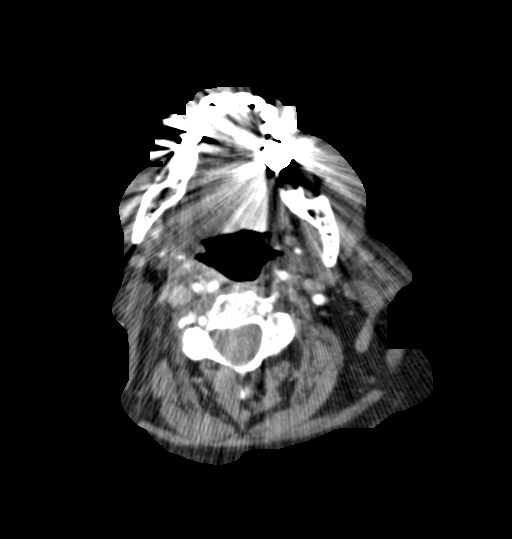
[im 180/315  bone]
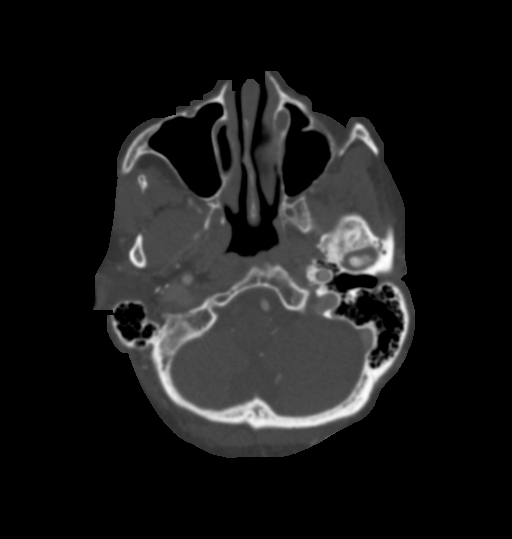
[im 225/315  soft-tissue]
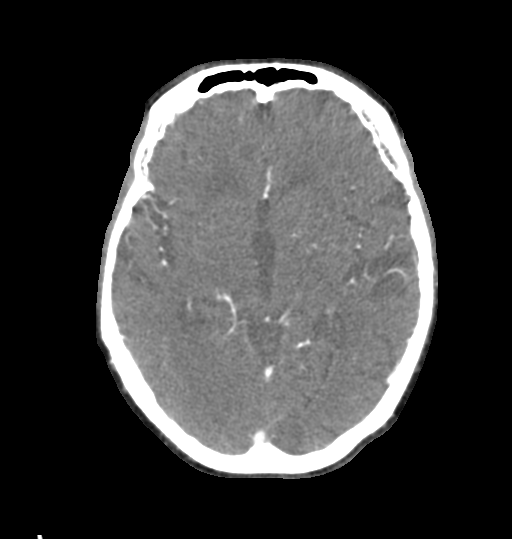
[im 270/315  bone]
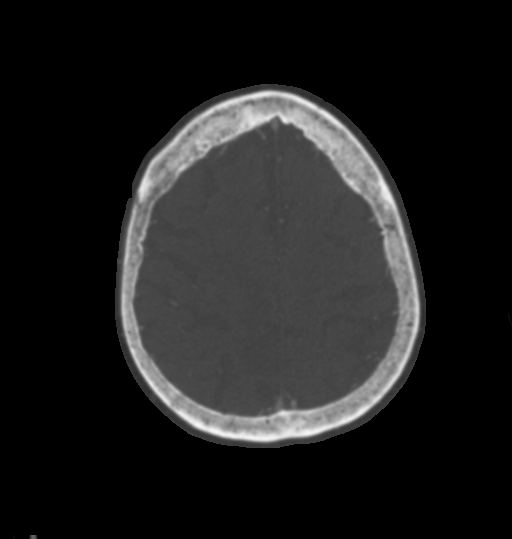

[Series 9: sag thin · sagittal · 0.47mm/px · 2 of 231 slices shown]
[im 35/231  soft-tissue]
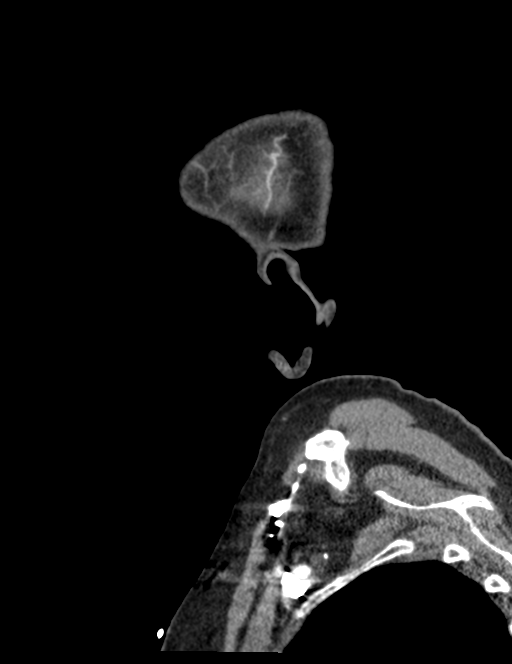
[im 197/231  soft-tissue]
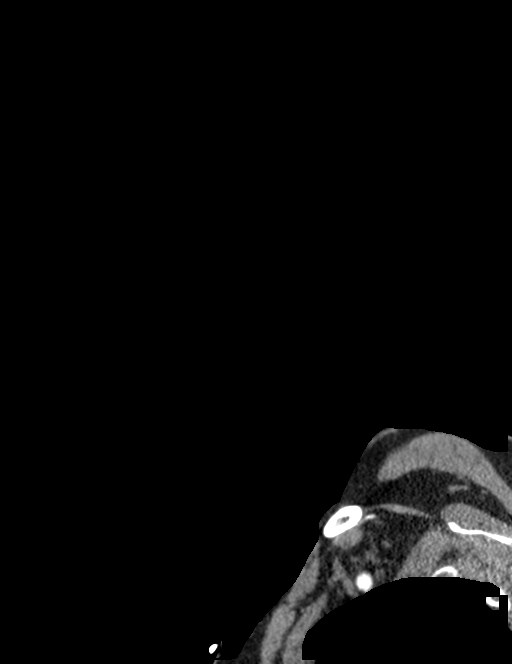

[10 of 36 positions shown; findings below may reference images not displayed]

RADIATION DOSE REDUCTION: This exam was performed according to the
departmental dose-optimization program which includes automated
exposure control, adjustment of the mA and/or kV according to
patient size and/or use of iterative reconstruction technique.

CONTRAST:  75mL OMNIPAQUE IOHEXOL 350 MG/ML SOLN
FINDINGS: CTA NECK FINDINGS

Aortic arch: Atherosclerosis of the great vessel origins, which are
patent.

Right carotid system: Patent. Atherosclerosis at the carotid
bifurcation with approximately 50% stenosis of the proximal ICA.

Left carotid system: Atherosclerosis at the carotid bifurcation with
approximately 40% stenosis of the ICA origin.

Vertebral arteries: Multifocal severe atherosclerosis the right V2
vertebral artery, nearly occlusive also, multifocal severe stenosis
of the right V3 vertebral artery with occlusion of the vertebral
artery at its dural margin. Left vertebral artery is patent without
hemodynamically significant stenosis.

Skeleton: Moderate to severe multilevel degenerative change.

Other neck: No evidence of acute abnormality on limited assessment.
Multiple thyroid nodules, measuring up to 9 mm and poorly evaluated
due to motion. No follow-up imaging recommended (ref: [HOSPITAL]. [DATE]): 143-50).

Upper chest: Approximately 1.8 cm ground-glass opacity in the imaged
right upper lobe and 4 mm area consolidation.

Review of the MIP images confirms the above findings

CTA HEAD FINDINGS

Anterior circulation: Bilateral intracranial ICAs are patent with
mild to moderate bilateral paraclinoid stenosis. Bilateral M1 MCAs
are patent with mild narrowing of the distal left M1 MCA. Motion
limited evaluation of the more distal MCA branches with multifocal
severe right M2 MCA stenosis. Also, suspected severe stenosis of a
small left M2 MCA branch anteriorly (for example series 6, image 92
small right A1 ACA, likely in part congenital given prominent left
A1 ACA. Moderate left A2 ACA stenosis. Severe distal A3/A4 ACA
stenosis bilaterally with intermittent/poor distal opacification.

Posterior circulation: The intradural right vertebral artery is
largely non-opacified with minimal irregular opacification.
Intradural left vertebral artery is patent. Small (1-2 mm)
outpouching at the right proximal basilar artery may represent
retrograde flow into the occluded right V4 vertebral artery versus
aneurysm. Basilar artery is patent with mild atherosclerotic
narrowing. Bilateral PCAs are patent with moderate to severe P2 PCA
stenosis on the left.

Venous sinuses: As permitted by contrast timing, patent.

Review of the MIP images confirms the above findings
IMPRESSION: 1. Multifocal severe stenosis of the right vertebral artery in the
neck with occlusion at its dural margin and largely occluded
intradurally.
2. Severe distal A3/A4 ACA stenosis bilaterally with
intermittent/poor distal opacification.
3. Suspected severe bilateral M2 MCA stenosis, although evaluation
is limited by motion.
4. Mild-to-moderate bilateral paraclinoid ICA stenosis.
5. Moderate to severe left P2 PCA stenosis.
6. Bilateral carotid bifurcation with proximally 50% right and 40%
left proximal ICA stenosis.
7. Small (1-2 mm) outpouching at the right proximal basilar artery
is suspicious for an aneurysm, although retrograde flow into the
occluded distal right vertebral artery is a differential
consideration.
8. Approximately 1.8 cm ground-glass opacity in the imaged right
upper lobe and 4 mm area consolidation. These areas may be
infectious/inflammatory; however, recommend follow-up CT chest in
3-6 months to ensure resolution and exclude malignancy. If areas
persist, subsequent management will be based upon the most
suspicious nodule(s). This recommendation follows the consensus
statement: Guidelines for Management of Incidental Pulmonary Nodules
Detected on CT Images: From the [HOSPITAL] [XV]; Radiology

## 2021-08-11 MED ORDER — SENNOSIDES-DOCUSATE SODIUM 8.6-50 MG PO TABS: 1.0000 | ORAL_TABLET | Freq: Every evening | ORAL | Status: DC | PRN

## 2021-08-11 MED ORDER — ACETAMINOPHEN 650 MG RE SUPP: 650.0000 mg | RECTAL | Status: DC | PRN

## 2021-08-11 MED ORDER — ACETAMINOPHEN 160 MG/5ML PO SOLN: 650.0000 mg | ORAL | Status: DC | PRN

## 2021-08-11 MED ORDER — STROKE: EARLY STAGES OF RECOVERY BOOK: Freq: Once | Status: DC

## 2021-08-11 MED ORDER — SODIUM CHLORIDE 0.9 % IV SOLN: Freq: Once | INTRAVENOUS | Status: AC

## 2021-08-11 MED ORDER — LABETALOL HCL 5 MG/ML IV SOLN
10.0000 mg | INTRAVENOUS | Status: DC | PRN
  Administered 2021-08-11 – 2021-08-12 (×4): 10 mg via INTRAVENOUS
  Filled 2021-08-11 (×3): qty 4

## 2021-08-11 MED ORDER — SODIUM CHLORIDE 0.9% IV SOLUTION: Freq: Once | INTRAVENOUS | Status: DC

## 2021-08-11 MED ORDER — TRANEXAMIC ACID-NACL 1000-0.7 MG/100ML-% IV SOLN
1000.0000 mg | Freq: Once | INTRAVENOUS | Status: AC
  Administered 2021-08-11: 1000 mg via INTRAVENOUS
  Filled 2021-08-11: qty 100

## 2021-08-11 MED ORDER — LABETALOL HCL 5 MG/ML IV SOLN
INTRAVENOUS | Status: AC
  Administered 2021-08-11: 10 mg via INTRAVENOUS
  Filled 2021-08-11: qty 4

## 2021-08-11 MED ORDER — ACETAMINOPHEN 650 MG RE SUPP
650.0000 mg | Freq: Once | RECTAL | Status: AC
  Administered 2021-08-11: 650 mg via RECTAL
  Filled 2021-08-11: qty 1

## 2021-08-11 MED ORDER — SODIUM CHLORIDE 0.9 % IV SOLN: INTRAVENOUS | Status: DC

## 2021-08-11 MED ORDER — CLEVIDIPINE BUTYRATE 0.5 MG/ML IV EMUL
0.0000 mg/h | INTRAVENOUS | Status: DC
  Administered 2021-08-11: 19:00:00 13 mg/h via INTRAVENOUS
  Administered 2021-08-11: 21 mg/h via INTRAVENOUS
  Administered 2021-08-11: 13:00:00 1 mg/h via INTRAVENOUS
  Administered 2021-08-11 (×2): 21 mg/h via INTRAVENOUS
  Administered 2021-08-12: 17 mg/h via INTRAVENOUS
  Administered 2021-08-12: 05:00:00 19 mg/h via INTRAVENOUS
  Administered 2021-08-12: 8 mg/h via INTRAVENOUS
  Administered 2021-08-12: 15 mg/h via INTRAVENOUS
  Administered 2021-08-12: 14:00:00 6 mg/h via INTRAVENOUS
  Administered 2021-08-12: 22:00:00 3 mg/h via INTRAVENOUS
  Administered 2021-08-12: 08:00:00 8 mg/h via INTRAVENOUS
  Administered 2021-08-12: 03:00:00 19 mg/h via INTRAVENOUS
  Filled 2021-08-11: qty 100
  Filled 2021-08-11 (×10): qty 50
  Filled 2021-08-11: qty 100
  Filled 2021-08-11: qty 50

## 2021-08-11 MED ORDER — PANTOPRAZOLE SODIUM 40 MG IV SOLR
40.0000 mg | Freq: Every day | INTRAVENOUS | Status: DC
  Administered 2021-08-11 – 2021-08-13 (×3): 40 mg via INTRAVENOUS
  Filled 2021-08-11 (×3): qty 10

## 2021-08-11 MED ORDER — TENECTEPLASE FOR STROKE
PACK | INTRAVENOUS | Status: AC
  Administered 2021-08-11: 18 mg via INTRAVENOUS
  Filled 2021-08-11: qty 10

## 2021-08-11 MED ORDER — LABETALOL HCL 5 MG/ML IV SOLN
10.0000 mg | Freq: Once | INTRAVENOUS | Status: AC
Start: 2021-08-11 — End: 2021-08-11

## 2021-08-11 MED ORDER — TENECTEPLASE FOR STROKE: 0.2500 mg/kg | PACK | Freq: Once | INTRAVENOUS | Status: AC

## 2021-08-11 MED ORDER — IOHEXOL 350 MG/ML SOLN
75.0000 mL | Freq: Once | INTRAVENOUS | Status: AC | PRN
  Administered 2021-08-11: 75 mL via INTRAVENOUS

## 2021-08-11 MED ORDER — ACETAMINOPHEN 325 MG PO TABS
650.0000 mg | ORAL_TABLET | ORAL | Status: DC | PRN
  Filled 2021-08-11: qty 2

## 2021-08-11 NOTE — Progress Notes (Signed)
1125 call time 1127 beeper time 1133 exam started 1136 exam finished 1136 images sent to soc 1137 exam completed in epic 1137 [facility] radiology called

## 2021-08-11 NOTE — ED Notes (Signed)
EDP notified that pt was opening eyes less and beginning to breath heavy. Instructed to notify tele-neuro. Teleneuro activated

## 2021-08-11 NOTE — ED Provider Notes (Signed)
[facility] EMERGENCY DEPARTMENT Provider Note   CSN: [id number] Arrival date & time: 08/11/21  1120  An emergency department physician performed an initial assessment on this suspected stroke patient at 1120.  History  Chief Complaint  Patient presents with   Code Stroke    Julia [name] is a 88 y.o. female.  Patient was brought to the emergency department with 30 minutes of being nonverbal and not moving her right arm.  Code stroke was called.  Patient has a past medical history of hypertension diabetes  The history is provided by a relative and medical records. No language interpreter was used.  Altered Mental Status Presenting symptoms: behavior changes   Severity:  Severe Most recent episode:  Today Episode history:  Continuous Timing:  Constant Progression:  Worsening Chronicity:  New Context: not alcohol use   Associated symptoms: no abdominal pain       Home Medications Prior to Admission medications   Medication Sig Start Date End Date Taking? Authorizing Provider  amLODipine (NORVASC) 2.5 MG tablet Take 2.5 mg by mouth daily. 06/29/21  Yes [provider]  aspirin EC 81 MG tablet Take 1 tablet (81 mg total) by mouth daily. Swallow whole. Discontinue after 08/10/21 07/20/21  Yes Kathie Dike, MD  cetirizine (ZYRTEC) 10 MG tablet Take 10 mg by mouth daily.   Yes [provider]  clopidogrel (PLAVIX) 75 MG tablet Take 1 tablet (75 mg total) by mouth daily. 07/21/21  Yes Kathie Dike, MD  fosfomycin (MONUROL) 3 g PACK Take 3 g by mouth every other day. 08/07/21  Yes [provider]  glipiZIDE (GLUCOTROL) 5 MG tablet Take 5 mg by mouth daily. 06/29/21  Yes [provider]  lisinopril (ZESTRIL) 40 MG tablet Take 40 mg by mouth daily. 06/29/21  Yes [provider]  loperamide (IMODIUM A-D) 2 MG tablet Take 2 mg by mouth 4 (four) times daily as needed for diarrhea or loose stools.   Yes [provider]  LORazepam  (ATIVAN) 0.5 MG tablet Take 0.5 mg by mouth daily as needed for anxiety. 05/24/21  Yes [provider]  metFORMIN (GLUCOPHAGE) 500 MG tablet Take 500 mg by mouth 2 (two) times daily. 07/03/21  Yes [provider]  [facility] powder Apply 1 application topically in the morning and at bedtime. 06/20/21  Yes [provider]  ondansetron (ZOFRAN-ODT) 4 MG disintegrating tablet Take 1 tablet by mouth every 8 (eight) hours as needed for nausea or vomiting. 07/25/20  Yes [provider]  pantoprazole (PROTONIX) 20 MG tablet Take 20 mg by mouth daily. 06/29/21  Yes [provider]  pravastatin (PRAVACHOL) 20 MG tablet Take 1 tablet (20 mg total) by mouth daily at 6 PM. 07/20/21  Yes Memon, Jolaine Artist, MD  sertraline (ZOLOFT) 50 MG tablet Take 50 mg by mouth daily. 06/29/21  Yes [provider]  TUMS 500 MG chewable tablet Chew 2 tablets by mouth 3 (three) times daily as needed for heartburn or indigestion. 06/23/21  Yes [provider]  Vitamin D, Ergocalciferol, (DRISDOL) 1.25 MG (50000 UNIT) CAPS capsule Take 50,000 Units by mouth once a week. Fridays 06/29/21  Yes [provider]  acetaminophen (TYLENOL) 500 MG tablet Take 500 mg by mouth 2 (two) times daily.    [provider]  nitrofurantoin, macrocrystal-monohydrate, (MACROBID) 100 MG capsule Take 1 capsule (100 mg total) by mouth every 12 (twelve) hours. Patient not taking: Reported on 08/11/2021 07/20/21   Kathie Dike, MD  Allergies    Sulfa antibiotics    Review of Systems   Review of Systems  Unable to perform ROS: Mental status change  Gastrointestinal:  Negative for abdominal pain.   Physical Exam Updated Vital Signs BP (!) 186/78    Pulse 91    Temp 98.3 F (36.8 C)    Resp (!) 21    Ht 5\' 6"  (1.676 m)    Wt 71.4 kg    SpO2 97%    BMI 25.39 kg/m  Physical Exam Constitutional:      Appearance: She is well-developed.  HENT:     Head: Normocephalic.      Nose: Nose normal.  Eyes:     General: No scleral icterus.    Conjunctiva/sclera: Conjunctivae normal.  Neck:     Thyroid: No thyromegaly.  Cardiovascular:     Rate and Rhythm: Normal rate and regular rhythm.     Heart sounds: No murmur heard.   No friction rub. No gallop.  Pulmonary:     Breath sounds: No stridor. No wheezing or rales.  Chest:     Chest wall: No tenderness.  Abdominal:     General: There is no distension.     Tenderness: There is no abdominal tenderness. There is no rebound.  Musculoskeletal:        General: Normal range of motion.     Cervical back: Neck supple.  Lymphadenopathy:     Cervical: No cervical adenopathy.  Skin:    Findings: No erythema or rash.  Neurological:     Mental Status: She is alert.     Motor: No abnormal muscle tone.     Coordination: Coordination normal.     Comments: Patient awake will not follow commands at all will not answer questions.  Patient cannot move right arm  Psychiatric:        Behavior: Behavior normal.    ED Results / Procedures / Treatments   Labs (all labs ordered are listed, but only abnormal results are displayed) Labs Reviewed  CBC - Abnormal; Notable for the following components:      Result Value   Hemoglobin 11.4 (*)    HCT 35.5 (*)    All other components within normal limits  COMPREHENSIVE METABOLIC PANEL - Abnormal; Notable for the following components:   Creatinine, Ser 1.06 (*)    GFR, Estimated 50 (*)    All other components within normal limits  RESP PANEL BY RT-PCR (FLU A&B, COVID) ARPGX2  ETHANOL  PROTIME-INR  APTT  DIFFERENTIAL  RAPID URINE DRUG SCREEN, HOSP PERFORMED  URINALYSIS, ROUTINE W REFLEX MICROSCOPIC  I-STAT CHEM 8, ED    EKG None  Radiology CT HEAD CODE STROKE WO CONTRAST  Result Date: 08/11/2021 CLINICAL DATA:  Code stroke.  Neuro deficit, acute, stroke suspected EXAM: CT HEAD WITHOUT CONTRAST TECHNIQUE: Contiguous axial images were obtained from the base of the skull  through the vertex without intravenous contrast. RADIATION DOSE REDUCTION: This exam was performed according to the departmental dose-optimization program which includes automated exposure control, adjustment of the mA and/or kV according to patient size and/or use of iterative reconstruction technique. COMPARISON:  07/18/2021. FINDINGS: Brain: Remote right frontal parietal infarct. Slightly increased hypoattenuation in the right thalamus. Otherwise, no evidence of acute infarct by CT. Additional advanced patchy and confluent white matter hypoattenuation, nonspecific but compatible chronic microvascular ischemic disease. No mass lesion, midline shift, hydrocephalus, or acute hemorrhage. Vascular: No hyperdense vessel identified. Calcific intracranial atherosclerosis. Skull: No acute fracture. Sinuses/Orbits:  Clear sinuses.  Unremarkable orbits. Other: No mastoid effusions. IMPRESSION: 1. Slightly increased hypoattenuation in the right thalamus. This could represent the sequela of advanced chronic microvascular ischemic disease, but acute infarct is difficult to exclude. An MRI could further evaluate if clinically indicated. 2. No acute hemorrhage. 3. Remote right frontoparietal infarct and similar advanced chronic microvascular ischemic disease. An MRI could provide more sensitive evaluation for acute infarct. Findings discussed with Dr. Roderic Palau via telephone at 11:42 a.m. Electronically Signed   By: Margaretha Sheffield M.D.   On: 08/11/2021 11:46    Procedures Procedures    Medications Ordered in ED Medications  labetalol (NORMODYNE) injection 10 mg (has no administration in time range)  clevidipine (CLEVIPREX) infusion 0.5 mg/mL (1 mg/hr Intravenous New Bag/Given 08/11/21 1325)  0.9 %  sodium chloride infusion ( Intravenous New Bag/Given 08/11/21 1325)  tenecteplase (TNKASE) injection for Stroke 18 mg (18 mg Intravenous Given 08/11/21 1230)  labetalol (NORMODYNE) injection 10 mg (10 mg Intravenous Given  08/11/21 1225)  iohexol (OMNIPAQUE) 350 MG/ML injection 75 mL (75 mLs Intravenous Contrast Given 08/11/21 1249)    ED Course/ Medical Decision Making/ A&P  CRITICAL CARE Performed by: Milton Ferguson Total critical care time: 40 minutes Critical care time was exclusive of separately billable procedures and treating other patients. Critical care was necessary to treat or prevent imminent or life-threatening deterioration. Critical care was time spent personally by me on the following activities: development of treatment plan with patient and/or surrogate as well as nursing, discussions with consultants, evaluation of patient's response to treatment, examination of patient, obtaining history from patient or surrogate, ordering and performing treatments and interventions, ordering and review of laboratory studies, ordering and review of radiographic studies, pulse oximetry and re-evaluation of patient's condition.                          Medical Decision Making Amount and/or Complexity of Data Reviewed Labs: ordered. Radiology: ordered.  Risk OTC drugs. Prescription drug management. Decision regarding hospitalization.   Patient was seen by neurology and thrombolytics were decided to be given.  Dr. Erlinda Hong felt like the patient could be admitted over at [facility] after thrombolytics.  The patient is not a candidate for interventional radiology procedure    This patient presents to the ED for concern of stroke, this involves an extensive number of treatment options, and is a complaint that carries with it a high risk of complications and morbidity.  The differential diagnosis includes ischemic stroke, tumor in the brain   Co morbidities that complicate the patient evaluation  Hypertension diabetes   Additional history obtained:  Additional history obtained from family External records from outside source obtained and reviewed including hospital records   Lab Tests:  I  Ordered, and personally interpreted labs.  The pertinent results include: CBC and chemistry   Imaging Studies ordered:  I ordered imaging studies including CT head shows no acute disease but second CT showed reportedly bleed I independently visualized and interpreted imaging which showed per CT negative for CT shows bleed I agree with the radiologist interpretation   Cardiac Monitoring:  The patient was maintained on a cardiac monitor.  I personally viewed and interpreted the cardiac monitored which showed an underlying rhythm of: Normal sinus rhythm   Medicines ordered and prescription drug management:  I ordered medication including TNKase Reevaluation of the patient after these medicines showed that the patient worsened I have reviewed the patients home medicines  and have made adjustments as needed   Test Considered:  MRI   Critical Interventions:  Neurology consult and thrombolytics given   Consultations Obtained:  I requested consultation with the neurology,  and discussed lab and imaging findings as well as pertinent plan - they recommend: Thrombolytics and admission   Problem List / ED Course:  Stroke and hemorrhage   Reevaluation:  After the interventions noted above, I reevaluated the patient and found that they have :worsened   Social Determinants of Health:  Dementia  Dispostion:  After consideration of the diagnostic results and the patients response to treatment, I feel that the patent would benefit from admission to current.         Final Clinical Impression(s) / ED Diagnoses Final diagnoses:  Acute ischemic stroke [facility])    Rx / DC Orders ED Discharge Orders     None         Milton Ferguson, MD 08/14/21 1124

## 2021-08-11 NOTE — ED Provider Notes (Signed)
Patient was given TXA for her stroke.  She has became more confused.  CT scan was done that showed intracerebral hemorrhage.  Neurology was notified and started her on some reversal agents for the bleeding.  I discussed the situation with her daughter in law and the patient was already a DNR and the daughter-in-law stated she also agrees with DNI.   Milton Ferguson, MD 08/11/21 1506

## 2021-08-11 NOTE — ED Notes (Signed)
Pt's cryoprecipitate infusion rate was changed to 453ml/hr at 1629 to infuse over 56min per order. Attempted to change infusion rate in [facility] but it would not let me change this.

## 2021-08-11 NOTE — ED Triage Notes (Signed)
Pt arrives via EMS from [facility] with sudden onset of weakness, AMS, not following commands and leaning to left side. LKW 9am while working with PT. Symptoms noted at 10:50am

## 2021-08-11 NOTE — ED Notes (Addendum)
Reviewed meds with neurologist to make aware that pt is currently taking plavix as well as aspirin at facility. Neurology confirms still okay to give tnk.

## 2021-08-11 NOTE — ED Notes (Signed)
BP noted to be trending upward. Neurologist Dr. Erlinda Hong aware, instructed to give pt 10mg  of labetolol prior to tnk for bp control.

## 2021-08-11 NOTE — H&P (Addendum)
Stroke Neurology H&P Note  The history was obtained from the RN and daughter.  During history and examination, all items were able to obtain unless otherwise noted.  History of Present Illness:  Julia [name] is a 88 y.o. Caucasian 88 year old female with history of hypertension, diabetes, Alzheimer disease, stroke and TIA presented to ED for code stroke.   Per chart "pt has had several stroke and TIAs in the last 1.5-2 years. The last one was in 08/2020 when she was admitted to [facility] with left sided weakness. With rehab later, her left arm weakness was recovered but her leg weakness remained and she could not walk anymore. At baseline in SNF, she was wheelchair bound but able to standup from wheelchair but not able to walk. Since then, from time to time when daughter visited her in SNF, she may have some left facial droop or left arm weakness but resolves the second day. Pt self has no complains of that and nobody can tell what it is and no specific management for that."   In 07/2021 patient had episode of "left facial droop, left sided weakness with slurry speech. She was sent to ED by EMS. Symptoms were resolved on arrival, no tPA given. BP 180s, and CT head showed age--indeterminate posterior right ACA territory infarct involving the parasagittal right frontoparietal region. However, this is new comparing with her 10/2020 CT head." MRI  No acute infarct. Remote right parietal cortical infarct that is new compared to 2020.  EF 60 to 65%.  EEG no seizure.  LDL 93, A1c 5.7.  Consider TIA episode, she was discharged on aspirin and Plavix DAPT for 3 weeks and then Plavix alone, as well as pravastatin 20.   Today, patient in nursing home working with PT/OT at her baseline at 9 AM, she was able to stand up with PT/OT, follows commands and was able to talk.  However around 10:50 AM she was found not to respond, nonverbal, left gaze and declined to the right side.  Code stroke activated.  BP with EMS  170/70, glucose 101.  CT no acute abnormality, questionable slightly decreased right thalamic hypodensity.   LKW: 9 AM TNK given?:  Yes after discussed with daughter Julia [name] over the phone IR Thrombectomy? No, poor baseline mRS = 4 Modified Rankin Scale: 4-Needs assistance to walk and tend to bodily needs with PMH of    Past Medical History:  Diagnosis Date   Alzheimer disease ([place])    Anxiety    CVA (cerebral vascular accident) ([place])    Hypertension     History reviewed. No pertinent surgical history.  History reviewed. No pertinent family history.  Social History:  has no history on file for tobacco use, alcohol use, and drug use.  Allergies:  Allergies  Allergen Reactions   Sulfa Antibiotics Rash    Other reaction(s): Other (See Comments)     No current facility-administered medications on file prior to encounter.   Current Outpatient Medications on File Prior to Encounter  Medication Sig Dispense Refill   amLODipine (NORVASC) 2.5 MG tablet Take 2.5 mg by mouth daily.     aspirin EC 81 MG tablet Take 1 tablet (81 mg total) by mouth daily. Swallow whole. Discontinue after 08/10/21 30 tablet 11   cetirizine (ZYRTEC) 10 MG tablet Take 10 mg by mouth daily.     clopidogrel (PLAVIX) 75 MG tablet Take 1 tablet (75 mg total) by mouth daily.     fosfomycin (MONUROL) 3 g PACK Take 3  g by mouth every other day.     glipiZIDE (GLUCOTROL) 5 MG tablet Take 5 mg by mouth daily.     lisinopril (ZESTRIL) 40 MG tablet Take 40 mg by mouth daily.     loperamide (IMODIUM A-D) 2 MG tablet Take 2 mg by mouth 4 (four) times daily as needed for diarrhea or loose stools.     LORazepam (ATIVAN) 0.5 MG tablet Take 0.5 mg by mouth daily as needed for anxiety.     metFORMIN (GLUCOPHAGE) 500 MG tablet Take 500 mg by mouth 2 (two) times daily.     NYAMYC powder Apply 1 application topically in the morning and at bedtime.     ondansetron (ZOFRAN-ODT) 4 MG disintegrating tablet Take 1 tablet by mouth  every 8 (eight) hours as needed for nausea or vomiting.     pantoprazole (PROTONIX) 20 MG tablet Take 20 mg by mouth daily.     pravastatin (PRAVACHOL) 20 MG tablet Take 1 tablet (20 mg total) by mouth daily at 6 PM.     sertraline (ZOLOFT) 50 MG tablet Take 50 mg by mouth daily.     TUMS 500 MG chewable tablet Chew 2 tablets by mouth 3 (three) times daily as needed for heartburn or indigestion.     Vitamin D, Ergocalciferol, (DRISDOL) 1.25 MG (50000 UNIT) CAPS capsule Take 50,000 Units by mouth once a week. Fridays     acetaminophen (TYLENOL) 500 MG tablet Take 500 mg by mouth 2 (two) times daily.     nitrofurantoin, macrocrystal-monohydrate, (MACROBID) 100 MG capsule Take 1 capsule (100 mg total) by mouth every 12 (twelve) hours. (Patient not taking: Reported on 08/11/2021) 10 capsule 0    Review of Systems: A full ROS was attempted today and was not able to be performed.   Physical Examination: Temp:  [98.3 F (36.8 C)-101 F (38.3 C)] 101 F (38.3 C) (02/10 1655) Pulse Rate:  [85-110] 94 (02/10 1655) Resp:  [12-25] 23 (02/10 1655) BP: (91-190)/(64-123) 167/76 (02/10 1655) SpO2:  [91 %-100 %] 96 % (02/10 1629) Weight:  [71.4 kg] 71.4 kg (02/10 1214)  General - well nourished, well developed, in no apparent distress.    Ophthalmologic - fundi not visualized due to noncooperation.    Cardiovascular - regular rhythm and rate  Neuro - Pt lying in bed, eyes closed, not open on voice or pain stimulation. Not answer questions but able to moan with pain stimulation. Still has left gaze palsy, not blinking to visual threat, b/l pupil equal but left pupil irregular shape per RN. Mild left facial droop which could be chronic per previous note and daughter at bedside. RUE withdraw to pain, BLU on knee flexion and foot on bed position without drift. LUE slight withdraw to pain, weaker than left. Pt dose have left sided chronic weakness per note and daughter at bedside. Sensation, coordination and  gait not tested.  Data Reviewed: CT ANGIO HEAD NECK W WO CM  Result Date: 08/11/2021 CLINICAL DATA:  Stroke/TIA, determine embolic source EXAM: CT ANGIOGRAPHY HEAD AND NECK TECHNIQUE: Multidetector CT imaging of the head and neck was performed using the standard protocol during bolus administration of intravenous contrast. Multiplanar CT image reconstructions and MIPs were obtained to evaluate the vascular anatomy. Carotid stenosis measurements (when applicable) are obtained utilizing NASCET criteria, using the distal internal carotid diameter as the denominator. RADIATION DOSE REDUCTION: This exam was performed according to the departmental dose-optimization program which includes automated exposure control, adjustment of the mA and/or kV  according to patient size and/or use of iterative reconstruction technique. CONTRAST:  52mL OMNIPAQUE IOHEXOL 350 MG/ML SOLN COMPARISON:  MRA 07/19/2021.  Same day CT code stroke. FINDINGS: CTA NECK FINDINGS Aortic arch: Atherosclerosis of the great vessel origins, which are patent. Right carotid system: Patent. Atherosclerosis at the carotid bifurcation with approximately 50% stenosis of the proximal ICA. Left carotid system: Atherosclerosis at the carotid bifurcation with approximately 40% stenosis of the ICA origin. Vertebral arteries: Multifocal severe atherosclerosis the right V2 vertebral artery, nearly occlusive also, multifocal severe stenosis of the right V3 vertebral artery with occlusion of the vertebral artery at its dural margin. Left vertebral artery is patent without hemodynamically significant stenosis. Skeleton: Moderate to severe multilevel degenerative change. Other neck: No evidence of acute abnormality on limited assessment. Multiple thyroid nodules, measuring up to 9 mm and poorly evaluated due to motion. No follow-up imaging recommended (ref: J Am Coll Radiol. 2015 Feb;12(2): 143-50). Upper chest: Approximately 1.8 cm ground-glass opacity in the imaged  right upper lobe and 4 mm area consolidation. Review of the MIP images confirms the above findings CTA HEAD FINDINGS Anterior circulation: Bilateral intracranial ICAs are patent with mild to moderate bilateral paraclinoid stenosis. Bilateral M1 MCAs are patent with mild narrowing of the distal left M1 MCA. Motion limited evaluation of the more distal MCA branches with multifocal severe right M2 MCA stenosis. Also, suspected severe stenosis of a small left M2 MCA branch anteriorly (for example series 6, image 92 small right A1 ACA, likely in part congenital given prominent left A1 ACA. Moderate left A2 ACA stenosis. Severe distal A3/A4 ACA stenosis bilaterally with intermittent/poor distal opacification. Posterior circulation: The intradural right vertebral artery is largely non-opacified with minimal irregular opacification. Intradural left vertebral artery is patent. Small (1-2 mm) outpouching at the right proximal basilar artery may represent retrograde flow into the occluded right V4 vertebral artery versus aneurysm. Basilar artery is patent with mild atherosclerotic narrowing. Bilateral PCAs are patent with moderate to severe P2 PCA stenosis on the left. Venous sinuses: As permitted by contrast timing, patent. Review of the MIP images confirms the above findings IMPRESSION: 1. Multifocal severe stenosis of the right vertebral artery in the neck with occlusion at its dural margin and largely occluded intradurally. 2. Severe distal A3/A4 ACA stenosis bilaterally with intermittent/poor distal opacification. 3. Suspected severe bilateral M2 MCA stenosis, although evaluation is limited by motion. 4. Mild-to-moderate bilateral paraclinoid ICA stenosis. 5. Moderate to severe left P2 PCA stenosis. 6. Bilateral carotid bifurcation with proximally 50% right and 40% left proximal ICA stenosis. 7. Small (1-2 mm) outpouching at the right proximal basilar artery is suspicious for an aneurysm, although retrograde flow into the  occluded distal right vertebral artery is a differential consideration. 8. Approximately 1.8 cm ground-glass opacity in the imaged right upper lobe and 4 mm area consolidation. These areas may be infectious/inflammatory; however, recommend follow-up CT chest in 3-6 months to ensure resolution and exclude malignancy. If areas persist, subsequent management will be based upon the most suspicious nodule(s). This recommendation follows the consensus statement: Guidelines for Management of Incidental Pulmonary Nodules Detected on CT Images: From the Fleischner Society 2017; Radiology 2017; 284:228-243. Electronically Signed   By: Margaretha Sheffield M.D.   On: 08/11/2021 13:39   CT Head Wo Contrast  Result Date: 08/11/2021 CLINICAL DATA:  Follow-up stroke, less responsive, EXAM: CT HEAD WITHOUT CONTRAST TECHNIQUE: Contiguous axial images were obtained from the base of the skull through the vertex without intravenous contrast. RADIATION DOSE REDUCTION:  This exam was performed according to the departmental dose-optimization program which includes automated exposure control, adjustment of the mA and/or kV according to patient size and/or use of iterative reconstruction technique. COMPARISON:  Earlier exam of 08/11/2021 FINDINGS: Brain: Interval acute hemorrhage within the RIGHT thalamus extending into internal capsule, area of hemorrhage measuring 2.7 x 1.9 x 1.6 cm. Underlying atrophy and small vessel chronic ischemic changes. No additional hemorrhage or infarct. No extra-axial fluid collections. No midline shift or significant mass effect. Vascular: Atherosclerotic calcification of internal carotid arteries at skull base mild residual contrast within the vessels from earlier CTA. Skull: Intact, demineralized Sinuses/Orbits: Clear Other: N/A IMPRESSION: Interval acute hemorrhage within the RIGHT thalamus extending into internal capsule measuring 2.7 x 1.9 x 1.6 cm. Atrophy with small vessel chronic ischemic changes.  Critical Value/emergent results were discussed with Dr. Roderic Palau on 08/11/2021 at 1453 hours. Electronically Signed   By: Lavonia Dana M.D.   On: 08/11/2021 14:58   MR ANGIO HEAD WO CONTRAST  Result Date: 07/19/2021 CLINICAL DATA:  Weakness and confusion for 3 days, stroke suspected EXAM: MRI HEAD WITHOUT CONTRAST MRA HEAD WITHOUT CONTRAST MRA NECK WITHOUT AND WITH CONTRAST TECHNIQUE: Multiplanar, multi-echo pulse sequences of the brain and surrounding structures were acquired without intravenous contrast. Angiographic images of the Circle of Willis were acquired using MRA technique without intravenous contrast. Angiographic images of the neck were acquired using MRA technique without and with intravenous contrast. Carotid stenosis measurements (when applicable) are obtained utilizing NASCET criteria, using the distal internal carotid diameter as the denominator. CONTRAST:  17mL GADAVIST GADOBUTROL 1 MMOL/ML IV SOLN COMPARISON:  MRI head 08/15/2018, correlation is also made with CT head 07/18/2021. No prior MRA FINDINGS: MRI HEAD FINDINGS Brain: No restricted diffusion to suggest acute or subacute infarct. No acute hemorrhage, mass, mass effect, or midline shift. Area of susceptibility in the right frontal lobe may be related to a prior infarct. Focus of susceptibility in the left basal ganglia and thalamus, likely related to prior hypertensive microhemorrhages. No hydrocephalus or extra-axial collection. Confluent T2 hyperintense signal in the periventricular white matter and pons, likely the sequela of moderate to severe chronic small vessel ischemic disease, which has progressed from the prior exam. Encephalomalacia in the medial right parietal lobe (series 13, image 38 and series 12, image 28), likely related to remote parietal cortical infarct, although this appears new compared to 2020. Vascular: Please see MRA findings below. Skull and upper cervical spine: Normal marrow signal. Sinuses/Orbits: No acute or  significant finding. Status post bilateral lens replacements. Other: The mastoids are well aerated. MRA HEAD FINDINGS Anterior circulation: Both internal carotid arteries are patent to the termini, with mild irregularity but without significant stenosis. Hypoplastic right A1 with severe focal stenosis distally (series 1, image 101). Mild narrowing in a patent left A1. Normal anterior communicating artery. Multifocal narrowing in the left ACA, with severe stenosis in the A2 segment (series 1, image 109), and complete non opacification of the more distal left ACA (series 1, images 160-166) . Mild narrowing in the proximal right A2 (series 1, image 107), with significantly diminished opacification of the distal right ACA (series 1, image 163) and mild multifocal narrowing in the distal ACA branches (series 1, image 163). Mild narrowing in the distal left M1 (series 100, image 102) and in the proximal inferior left M2 (series 1, image 99). Poor perfusion of additional branches of the inferior left M2, with intermittent opacification. Distal left MCA branches are irregular but otherwise perfused. Irregularity  without focal stenosis in the right M1. Multifocal narrowing in the M2 and more distal right MCA branches, with focal poor opacification of some M3 branches (for example series 1, images 105-116). Posterior circulation: The left V4 segment and left PICA origin are patent. The right V4 is poorly opacified proximally and not opacified distally, with some retrograde flow in the most distal aspect of the right V4. A prominent right AICA is visualized. Basilar somewhat irregular but patent to its distal aspect. Superior cerebellar arteries patent proximally. Bilateral P1 segments originate from the basilar artery. The left posterior communicating artery is visualized, although there appears to be significant stenosis in the midportion of the vessel, with poor signal. PCAs with multifocal irregularity, as well as focal  stenosis in the left distal P2 (series 1, image 84) and proximal P3 segment (series 1, images 92-96). Poor visualization of the P3s bilaterally. Anatomic variants: None significant MRA NECK FINDINGS Aortic arch: Three-vessel arch without significant stenosis. No dissection or aneurysm. Right carotid system: Mild narrowing at the bifurcation, without hemodynamically significant stenosis. No evidence of occlusion or aneurysm. Left carotid system: Mild narrowing at the bifurcation without hemodynamically significant stenosis. No evidence of occlusion or aneurysm. Vertebral arteries: Normal left vertebral artery, which is patent from its origin to the vertebrobasilar junction, without significant stenosis, occlusion, or aneurysm. Multifocal narrowing of the right V1, which appears patent, with intermittent non opacification of the right V2 (for example series 17, images 46-48). Intermittent opacification is also noted in the V3 and V4 segments. Other: None IMPRESSION: 1. No acute infarct. Sequela of chronic small vessel disease, with a remote right parietal cortical infarct that is new compared to 2020. 2. Intermittent non opacification of the right V2 and V3 segments, with non opacification of the majority of the right V4 segment, likely secondary to multifocal atherosclerotic narrowing. No other hemodynamically significant stenosis in the neck. 3. No other intracranial proximal large vessel occlusion, however there is multifocal intracranial atherosclerotic narrowing, with focal non opacification of the distal ACAs. Additional severe stenosis or poor perfusion is noted in the distal right A1, proximal left A2, left M2, distal left P2, bilateral distal MCAs and PCAs, and left posterior communicating artery. Electronically Signed   By: Merilyn Baba M.D.   On: 07/19/2021 13:27   MR ANGIO NECK W WO CONTRAST  Result Date: 07/19/2021 CLINICAL DATA:  Weakness and confusion for 3 days, stroke suspected EXAM: MRI HEAD  WITHOUT CONTRAST MRA HEAD WITHOUT CONTRAST MRA NECK WITHOUT AND WITH CONTRAST TECHNIQUE: Multiplanar, multi-echo pulse sequences of the brain and surrounding structures were acquired without intravenous contrast. Angiographic images of the Circle of Willis were acquired using MRA technique without intravenous contrast. Angiographic images of the neck were acquired using MRA technique without and with intravenous contrast. Carotid stenosis measurements (when applicable) are obtained utilizing NASCET criteria, using the distal internal carotid diameter as the denominator. CONTRAST:  2mL GADAVIST GADOBUTROL 1 MMOL/ML IV SOLN COMPARISON:  MRI head 08/15/2018, correlation is also made with CT head 07/18/2021. No prior MRA FINDINGS: MRI HEAD FINDINGS Brain: No restricted diffusion to suggest acute or subacute infarct. No acute hemorrhage, mass, mass effect, or midline shift. Area of susceptibility in the right frontal lobe may be related to a prior infarct. Focus of susceptibility in the left basal ganglia and thalamus, likely related to prior hypertensive microhemorrhages. No hydrocephalus or extra-axial collection. Confluent T2 hyperintense signal in the periventricular white matter and pons, likely the sequela of moderate to severe chronic small  vessel ischemic disease, which has progressed from the prior exam. Encephalomalacia in the medial right parietal lobe (series 13, image 38 and series 12, image 28), likely related to remote parietal cortical infarct, although this appears new compared to 2020. Vascular: Please see MRA findings below. Skull and upper cervical spine: Normal marrow signal. Sinuses/Orbits: No acute or significant finding. Status post bilateral lens replacements. Other: The mastoids are well aerated. MRA HEAD FINDINGS Anterior circulation: Both internal carotid arteries are patent to the termini, with mild irregularity but without significant stenosis. Hypoplastic right A1 with severe focal stenosis  distally (series 1, image 101). Mild narrowing in a patent left A1. Normal anterior communicating artery. Multifocal narrowing in the left ACA, with severe stenosis in the A2 segment (series 1, image 109), and complete non opacification of the more distal left ACA (series 1, images 160-166) . Mild narrowing in the proximal right A2 (series 1, image 107), with significantly diminished opacification of the distal right ACA (series 1, image 163) and mild multifocal narrowing in the distal ACA branches (series 1, image 163). Mild narrowing in the distal left M1 (series 100, image 102) and in the proximal inferior left M2 (series 1, image 99). Poor perfusion of additional branches of the inferior left M2, with intermittent opacification. Distal left MCA branches are irregular but otherwise perfused. Irregularity without focal stenosis in the right M1. Multifocal narrowing in the M2 and more distal right MCA branches, with focal poor opacification of some M3 branches (for example series 1, images 105-116). Posterior circulation: The left V4 segment and left PICA origin are patent. The right V4 is poorly opacified proximally and not opacified distally, with some retrograde flow in the most distal aspect of the right V4. A prominent right AICA is visualized. Basilar somewhat irregular but patent to its distal aspect. Superior cerebellar arteries patent proximally. Bilateral P1 segments originate from the basilar artery. The left posterior communicating artery is visualized, although there appears to be significant stenosis in the midportion of the vessel, with poor signal. PCAs with multifocal irregularity, as well as focal stenosis in the left distal P2 (series 1, image 84) and proximal P3 segment (series 1, images 92-96). Poor visualization of the P3s bilaterally. Anatomic variants: None significant MRA NECK FINDINGS Aortic arch: Three-vessel arch without significant stenosis. No dissection or aneurysm. Right carotid  system: Mild narrowing at the bifurcation, without hemodynamically significant stenosis. No evidence of occlusion or aneurysm. Left carotid system: Mild narrowing at the bifurcation without hemodynamically significant stenosis. No evidence of occlusion or aneurysm. Vertebral arteries: Normal left vertebral artery, which is patent from its origin to the vertebrobasilar junction, without significant stenosis, occlusion, or aneurysm. Multifocal narrowing of the right V1, which appears patent, with intermittent non opacification of the right V2 (for example series 17, images 46-48). Intermittent opacification is also noted in the V3 and V4 segments. Other: None IMPRESSION: 1. No acute infarct. Sequela of chronic small vessel disease, with a remote right parietal cortical infarct that is new compared to 2020. 2. Intermittent non opacification of the right V2 and V3 segments, with non opacification of the majority of the right V4 segment, likely secondary to multifocal atherosclerotic narrowing. No other hemodynamically significant stenosis in the neck. 3. No other intracranial proximal large vessel occlusion, however there is multifocal intracranial atherosclerotic narrowing, with focal non opacification of the distal ACAs. Additional severe stenosis or poor perfusion is noted in the distal right A1, proximal left A2, left M2, distal left P2, bilateral distal  MCAs and PCAs, and left posterior communicating artery. Electronically Signed   By: Merilyn Baba M.D.   On: 07/19/2021 13:27   MR BRAIN WO CONTRAST  Result Date: 07/19/2021 CLINICAL DATA:  Weakness and confusion for 3 days, stroke suspected EXAM: MRI HEAD WITHOUT CONTRAST MRA HEAD WITHOUT CONTRAST MRA NECK WITHOUT AND WITH CONTRAST TECHNIQUE: Multiplanar, multi-echo pulse sequences of the brain and surrounding structures were acquired without intravenous contrast. Angiographic images of the Circle of Willis were acquired using MRA technique without intravenous  contrast. Angiographic images of the neck were acquired using MRA technique without and with intravenous contrast. Carotid stenosis measurements (when applicable) are obtained utilizing NASCET criteria, using the distal internal carotid diameter as the denominator. CONTRAST:  61mL GADAVIST GADOBUTROL 1 MMOL/ML IV SOLN COMPARISON:  MRI head 08/15/2018, correlation is also made with CT head 07/18/2021. No prior MRA FINDINGS: MRI HEAD FINDINGS Brain: No restricted diffusion to suggest acute or subacute infarct. No acute hemorrhage, mass, mass effect, or midline shift. Area of susceptibility in the right frontal lobe may be related to a prior infarct. Focus of susceptibility in the left basal ganglia and thalamus, likely related to prior hypertensive microhemorrhages. No hydrocephalus or extra-axial collection. Confluent T2 hyperintense signal in the periventricular white matter and pons, likely the sequela of moderate to severe chronic small vessel ischemic disease, which has progressed from the prior exam. Encephalomalacia in the medial right parietal lobe (series 13, image 38 and series 12, image 28), likely related to remote parietal cortical infarct, although this appears new compared to 2020. Vascular: Please see MRA findings below. Skull and upper cervical spine: Normal marrow signal. Sinuses/Orbits: No acute or significant finding. Status post bilateral lens replacements. Other: The mastoids are well aerated. MRA HEAD FINDINGS Anterior circulation: Both internal carotid arteries are patent to the termini, with mild irregularity but without significant stenosis. Hypoplastic right A1 with severe focal stenosis distally (series 1, image 101). Mild narrowing in a patent left A1. Normal anterior communicating artery. Multifocal narrowing in the left ACA, with severe stenosis in the A2 segment (series 1, image 109), and complete non opacification of the more distal left ACA (series 1, images 160-166) . Mild narrowing  in the proximal right A2 (series 1, image 107), with significantly diminished opacification of the distal right ACA (series 1, image 163) and mild multifocal narrowing in the distal ACA branches (series 1, image 163). Mild narrowing in the distal left M1 (series 100, image 102) and in the proximal inferior left M2 (series 1, image 99). Poor perfusion of additional branches of the inferior left M2, with intermittent opacification. Distal left MCA branches are irregular but otherwise perfused. Irregularity without focal stenosis in the right M1. Multifocal narrowing in the M2 and more distal right MCA branches, with focal poor opacification of some M3 branches (for example series 1, images 105-116). Posterior circulation: The left V4 segment and left PICA origin are patent. The right V4 is poorly opacified proximally and not opacified distally, with some retrograde flow in the most distal aspect of the right V4. A prominent right AICA is visualized. Basilar somewhat irregular but patent to its distal aspect. Superior cerebellar arteries patent proximally. Bilateral P1 segments originate from the basilar artery. The left posterior communicating artery is visualized, although there appears to be significant stenosis in the midportion of the vessel, with poor signal. PCAs with multifocal irregularity, as well as focal stenosis in the left distal P2 (series 1, image 84) and proximal P3 segment (series  1, images 92-96). Poor visualization of the P3s bilaterally. Anatomic variants: None significant MRA NECK FINDINGS Aortic arch: Three-vessel arch without significant stenosis. No dissection or aneurysm. Right carotid system: Mild narrowing at the bifurcation, without hemodynamically significant stenosis. No evidence of occlusion or aneurysm. Left carotid system: Mild narrowing at the bifurcation without hemodynamically significant stenosis. No evidence of occlusion or aneurysm. Vertebral arteries: Normal left vertebral  artery, which is patent from its origin to the vertebrobasilar junction, without significant stenosis, occlusion, or aneurysm. Multifocal narrowing of the right V1, which appears patent, with intermittent non opacification of the right V2 (for example series 17, images 46-48). Intermittent opacification is also noted in the V3 and V4 segments. Other: None IMPRESSION: 1. No acute infarct. Sequela of chronic small vessel disease, with a remote right parietal cortical infarct that is new compared to 2020. 2. Intermittent non opacification of the right V2 and V3 segments, with non opacification of the majority of the right V4 segment, likely secondary to multifocal atherosclerotic narrowing. No other hemodynamically significant stenosis in the neck. 3. No other intracranial proximal large vessel occlusion, however there is multifocal intracranial atherosclerotic narrowing, with focal non opacification of the distal ACAs. Additional severe stenosis or poor perfusion is noted in the distal right A1, proximal left A2, left M2, distal left P2, bilateral distal MCAs and PCAs, and left posterior communicating artery. Electronically Signed   By: Merilyn Baba M.D.   On: 07/19/2021 13:27   EEG adult  Result Date: 07/19/2021 Jaynie Bream, MD     07/19/2021  9:36 PM ROUTINE EEG REPORT DATE OF STUDY:  07/19/21 from 16:55 to 17:35 HISTORY: 88yo woman with prior stroke with residual left sided weakness with episode of left sided weakness and slurred speech. DESCRIPTION: During maximal wakefulness, the background was organized and continuous but mildly slow.  There was a posterior dominant rhythm seen up to 6 Hz. Spontaneous variability and reactivity to stimulation were present. Stage I (N1) sleep was recorded, with alpha dropout, slow roving eye movements, high-voltage centrally predominant vertex waves, and positive occipital sharp transients of sleep (POSTS). Stage II (N2) sleep was recorded, with bilateral and symmetric  K complexes, and sleep spindles at 12-16 Hz. There was a mild asymmetry with decreased amplitudes seen over the right frontal region. There were frequent very brief bursts of polymorphic delta slowing seen moreso over the right hemisphere between 1-5 seconds. No epileptiform discharges were recorded. No clinical or electrographic seizures were recorded.  Hyperventilation and photic stimulation were not performed. CLASSIFICATION (EEG IMPRESSION): Abnormal Significance II (awake, asleep) Intermittent slow, lateralized, right Asymmetry (mild), right frontal decreased Background slow CLINICAL INTERPRETATION: 1) There is evidence of moderate focal cerebral dysfunction located over the right frontal region which is in keeping with patient's history of prior infarct located there. 2) There is also evidence of a diffuse mild to moderate encephalopathy which is non-specific and could be related to underlying neurocognitive disorders, multiple prior infarcts, or sedating medications. 3) No seizures were seen on this recording.  A routine EEG does not exclude a diagnosis of epilepsy. Colby A. Marvel Plan, MD Neurology and Clinical Neurophysiology  ECHOCARDIOGRAM COMPLETE  Result Date: 07/19/2021    ECHOCARDIOGRAM REPORT   Patient Name:   TYRONE BALASH Date of Exam: 07/19/2021 Medical Rec #:  [id number]   Height:       66.0 in Accession #:    [id number]  Weight:       164.9 lb Date of Birth:  08/16/1927  BSA:          1.842 m Patient Age:    87 years    BP:           155/58 mmHg Patient Gender: F           HR:           72 bpm. Exam Location:  [facility] Procedure: 2D Echo, Cardiac Doppler and Color Doppler Indications:    Stroke  History:        Patient has no prior history of Echocardiogram examinations.                 Stroke, Signs/Symptoms:Alzheimer's; Risk Factors:Hypertension                 and Diabetes.  Sonographer:    Wenda Low Referring Phys: [id number] OLADAPO ADEFESO IMPRESSIONS  1. Left ventricular ejection  fraction, by estimation, is 60 to 65%. The left ventricle has normal function. The left ventricle has no regional wall motion abnormalities. There is mild left ventricular hypertrophy. Left ventricular diastolic parameters are consistent with Grade I diastolic dysfunction (impaired relaxation).  2. Right ventricular systolic function is normal. The right ventricular size is normal. There is mildly elevated pulmonary artery systolic pressure. The estimated right ventricular systolic pressure is 27.0 mmHg.  3. The mitral valve is normal in structure. Trivial mitral valve regurgitation. No evidence of mitral stenosis.  4. The aortic valve is tricuspid. Aortic valve regurgitation is not visualized. No aortic stenosis is present.  5. The inferior vena cava is normal in size with greater than 50% respiratory variability, suggesting right atrial pressure of 3 mmHg. FINDINGS  Left Ventricle: Left ventricular ejection fraction, by estimation, is 60 to 65%. The left ventricle has normal function. The left ventricle has no regional wall motion abnormalities. The left ventricular internal cavity size was normal in size. There is  mild left ventricular hypertrophy. Left ventricular diastolic parameters are consistent with Grade I diastolic dysfunction (impaired relaxation). Right Ventricle: The right ventricular size is normal. No increase in right ventricular wall thickness. Right ventricular systolic function is normal. There is mildly elevated pulmonary artery systolic pressure. The tricuspid regurgitant velocity is 2.88  m/s, and with an assumed right atrial pressure of 3 mmHg, the estimated right ventricular systolic pressure is 35.0 mmHg. Left Atrium: Left atrial size was normal in size. Right Atrium: Right atrial size was normal in size. Pericardium: There is no evidence of pericardial effusion. Presence of epicardial fat layer. Mitral Valve: The mitral valve is normal in structure. Trivial mitral valve regurgitation. No  evidence of mitral valve stenosis. MV peak gradient, 8.6 mmHg. The mean mitral valve gradient is 2.0 mmHg. Tricuspid Valve: The tricuspid valve is normal in structure. Tricuspid valve regurgitation is trivial. Aortic Valve: The aortic valve is tricuspid. Aortic valve regurgitation is not visualized. No aortic stenosis is present. Aortic valve mean gradient measures 4.0 mmHg. Aortic valve peak gradient measures 7.4 mmHg. Aortic valve area, by VTI measures 2.83 cm. Pulmonic Valve: The pulmonic valve was not well visualized. Pulmonic valve regurgitation is not visualized. Aorta: The aortic root is normal in size and structure. Venous: The inferior vena cava is normal in size with greater than 50% respiratory variability, suggesting right atrial pressure of 3 mmHg. IAS/Shunts: The interatrial septum was not well visualized.  LEFT VENTRICLE PLAX 2D LVIDd:         4.20 cm     Diastology LVIDs:  3.10 cm     LV e' medial:    5.55 cm/s LV PW:         1.20 cm     LV E/e' medial:  12.8 LV IVS:        1.30 cm     LV e' lateral:   5.00 cm/s LVOT diam:     2.00 cm     LV E/e' lateral: 14.2 LV SV:         83 LV SV Index:   45 LVOT Area:     3.14 cm  LV Volumes (MOD) LV vol d, MOD A2C: 45.8 ml LV vol d, MOD A4C: 47.6 ml LV vol s, MOD A2C: 22.2 ml LV vol s, MOD A4C: 16.3 ml LV SV MOD A2C:     23.6 ml LV SV MOD A4C:     47.6 ml LV SV MOD BP:      28.6 ml RIGHT VENTRICLE RV Basal diam:  3.00 cm RV Mid diam:    2.10 cm RV S prime:     18.60 cm/s TAPSE (M-mode): 2.3 cm LEFT ATRIUM             Index        RIGHT ATRIUM           Index LA diam:        3.90 cm 2.12 cm/m   RA Area:     15.40 cm LA Vol (A2C):   44.6 ml 24.21 ml/m  RA Volume:   36.30 ml  19.70 ml/m LA Vol (A4C):   38.4 ml 20.84 ml/m LA Biplane Vol: 41.8 ml 22.69 ml/m  AORTIC VALVE                    PULMONIC VALVE AV Area (Vmax):    2.84 cm     PV Vmax:       0.92 m/s AV Area (Vmean):   2.86 cm     PV Peak grad:  3.4 mmHg AV Area (VTI):     2.83 cm AV Vmax:            136.00 cm/s AV Vmean:          85.300 cm/s AV VTI:            0.294 m AV Peak Grad:      7.4 mmHg AV Mean Grad:      4.0 mmHg LVOT Vmax:         123.00 cm/s LVOT Vmean:        77.700 cm/s LVOT VTI:          0.265 m LVOT/AV VTI ratio: 0.90  AORTA Ao Root diam: 2.90 cm MITRAL VALVE                TRICUSPID VALVE MV Area (PHT): 2.97 cm     TR Peak grad:   33.2 mmHg MV Area VTI:   2.80 cm     TR Vmax:        288.00 cm/s MV Peak grad:  8.6 mmHg MV Mean grad:  2.0 mmHg     SHUNTS MV Vmax:       1.47 m/s     Systemic VTI:  0.26 m MV Vmean:      65.1 cm/s    Systemic Diam: 2.00 cm MV Decel Time: 255 msec MV E velocity: 71.10 cm/s MV A velocity: 113.00 cm/s MV E/A ratio:  0.63 Oswaldo Milian MD Electronically signed by  Oswaldo Milian MD Signature Date/Time: 07/19/2021/3:42:13 PM    Final    CT HEAD CODE STROKE WO CONTRAST  Result Date: 08/11/2021 CLINICAL DATA:  Code stroke.  Neuro deficit, acute, stroke suspected EXAM: CT HEAD WITHOUT CONTRAST TECHNIQUE: Contiguous axial images were obtained from the base of the skull through the vertex without intravenous contrast. RADIATION DOSE REDUCTION: This exam was performed according to the departmental dose-optimization program which includes automated exposure control, adjustment of the mA and/or kV according to patient size and/or use of iterative reconstruction technique. COMPARISON:  07/18/2021. FINDINGS: Brain: Remote right frontal parietal infarct. Slightly increased hypoattenuation in the right thalamus. Otherwise, no evidence of acute infarct by CT. Additional advanced patchy and confluent white matter hypoattenuation, nonspecific but compatible chronic microvascular ischemic disease. No mass lesion, midline shift, hydrocephalus, or acute hemorrhage. Vascular: No hyperdense vessel identified. Calcific intracranial atherosclerosis. Skull: No acute fracture. Sinuses/Orbits: Clear sinuses.  Unremarkable orbits. Other: No mastoid effusions. IMPRESSION:  1. Slightly increased hypoattenuation in the right thalamus. This could represent the sequela of advanced chronic microvascular ischemic disease, but acute infarct is difficult to exclude. An MRI could further evaluate if clinically indicated. 2. No acute hemorrhage. 3. Remote right frontoparietal infarct and similar advanced chronic microvascular ischemic disease. An MRI could provide more sensitive evaluation for acute infarct. Findings discussed with Dr. Roderic Palau via telephone at 11:42 a.m. Electronically Signed   By: Margaretha Sheffield M.D.   On: 08/11/2021 11:46   CT HEAD CODE STROKE WO CONTRAST  Result Date: 07/18/2021 CLINICAL DATA:  Code stroke. EXAM: CT HEAD WITHOUT CONTRAST TECHNIQUE: Contiguous axial images were obtained from the base of the skull through the vertex without intravenous contrast. RADIATION DOSE REDUCTION: This exam was performed according to the departmental dose-optimization program which includes automated exposure control, adjustment of the mA and/or kV according to patient size and/or use of iterative reconstruction technique. COMPARISON:  Prior CT from 11/13/2020. FINDINGS: Brain: Diffuse prominence of the CSF containing spaces compatible generalized cerebral atrophy. Patchy and confluent hypodensity involving the supratentorial cerebral white matter most consistent with chronic small vessel ischemic disease, moderate in nature. Few scatter remote lacunar infarcts present about the deep gray nuclei. No acute intracranial hemorrhage. Hypodensity involving the parasagittal right frontoparietal region adjacent to the falx consistent with a right ACA territory ischemic infarct, age indeterminate. Upon review of prior CT from 11/13/2020, there is suggestion of a small acute ischemic infarct at this location on prior head CT, although the area involved on today's exam appears larger, and a superimposed acute component would be difficult to exclude. No other visible large vessel territory  infarct. No mass lesion, mass effect, or midline shift. No hydrocephalus or extra-axial fluid collection. Vascular: No visible hyperdense vessel. Calcified atherosclerosis present at the skull base. Skull: Scalp soft tissues and calvarium within normal limits. Sinuses/Orbits: Globes and orbital soft tissues demonstrate no acute finding. Visualized paranasal sinuses and mastoid air cells are clear. Other: None. ASPECTS ([place] Stroke Program Early CT Score) - Ganglionic level infarction (caudate, lentiform nuclei, internal capsule, insula, M1-M3 cortex): 7 - Supraganglionic infarction (M4-M6 cortex): 3 Total score (0-10 with 10 being normal): 10 IMPRESSION: 1. Age-indeterminate posterior right ACA territory infarct involving the parasagittal right frontoparietal region. While this could be chronic in nature, a possible acute to subacute component is difficult to exclude, and could be considered in the correct clinical setting. No intracranial hemorrhage. 2. ASPECTS is 10. 3. Underlying age-related cerebral atrophy with moderate chronic microvascular ischemic disease. Critical Value/emergent results  were called by telephone at the time of interpretation on 07/18/2021 at 10:51 pm to provider Godfrey Pick , who verbally acknowledged these results. Electronically Signed   By: Jeannine Boga M.D.   On: 07/18/2021 22:58    Assessment: 88 y.o. female with history of hypertension, diabetes, Alzheimer disease, stroke in 08/2020 and TIA last month presented to ED for not to respond, nonverbal, left gaze and declined to the right side.  Last seen well 9 AM.  NIH score 18.  CT no acute abnormality, question right thalamic slightly increased hypodensity.  Patient has no current indication for TNK, I talked with patient daughter Julia [name] over the phone, discussed with her about benefit and risks of TNK, she would like to proceed.  Patient not a candidate for thrombectomy due to poor baseline with mRS = 4.     TNK was delayed  as patient initially had no weight and we have to get patient change to another bed for weighing.  Then patient had high blood pressure over the goal, used labetalol and waited BP under goal before TNK.   I have explained to the patient daughter Julia [name] the nature of the patient's condition, the use of fibrinolytic agent, and the benefits to be reasonably expected compared with alternative approaches. However, there is a small but real risk of complication with this medication, including (if applicable) but not limited to permanent neurologic injury (paralysis, coma, etc.), bleeding in the brain or other part of the body, need for blood product transfusion, drug reactions, and even loss of life. I have also indicated that with any procedure there is always the possibility of an unexpected complication. All questions were answered prior to initiating therapy and she express understanding of the treatment plan and consent to the treatment.   However, developed drowsy sleepy. Ordered stat CT head which showed right thalamic ICH post TNK. Currently on cleviprex, requested BP goal < 140, and ordered TXA and cryo for reversal. Will repeat CT in 4-6 hours. Discussed with daughter Julia [name] over the tele device. She stated that pt has not been doing well for the last 2 years, wheelchair bound, low quality of life. Pt is DNR, and daughter would against any intubation also if pt had respiratory failure. Will continue current treatment without escalation.    Plan: admit to [facility] 4N ICU for routine post IV TNK and ICH post TNK Check blood pressure and NIHSS every 15 min for 2 h, then every 30 min for 6 h, and finally every hour for 16 h BP goal < 140 Repeat CT at 2000 to document stabilization of hematoma MRI brain in am EEG to rule out seizure S/p TXA and cryo Stat CT head without contrast if further acute neuro changes NPO until swallowing screen performed and passed No antithrombotics due to [place] Telemetry DVT  prophylaxis with SCDs Code status - DNR/DNI  Rosalin Hawking, MD PhD Stroke Neurology 08/11/2021 6:29 PM

## 2021-08-11 NOTE — Consult Note (Addendum)
[facility] Telemedicine Consult   Requesting Provider: Dr. Roderic Palau  Chief Complaint: aphasia, left gaze  HPI:  88 year old female with history of hypertension, diabetes, Alzheimer disease, stroke and TIA presented to ED for code stroke.  Per chart "pt has had several stroke and TIAs in the last 1.5-2 years. The last one was in 08/2020 when she was admitted to [facility] with left sided weakness. With rehab later, her left arm weakness was recovered but her leg weakness remained and she could not walk anymore. At baseline in SNF, she was wheelchair bound but able to standup from wheelchair but not able to walk. Since then, from time to time when daughter visited her in SNF, she may have some left facial droop or left arm weakness but resolves the second day. Pt self has no complains of that and nobody can tell what it is and no specific management for that."  In 07/2021 patient had episode of "left facial droop, left sided weakness with slurry speech. She was sent to ED by EMS. Symptoms were resolved on arrival, no tPA given. BP 180s, and CT head showed age--indeterminate posterior right ACA territory infarct involving the parasagittal right frontoparietal region. However, this is new comparing with her 10/2020 CT head." MRI  No acute infarct. Remote right parietal cortical infarct that is new compared to 2020.  EF 60 to 65%.  EEG no seizure.  LDL 93, A1c 5.7.  Consider TIA episode, she was discharged on aspirin and Plavix DAPT for 3 weeks and then Plavix alone, as well as pravastatin 20.  Today, patient in nursing home working with PT/OT at her baseline at 9 AM, she was able to stand up with PT/OT, follows commands and was able to talk.  However around 10:50 AM she was found not to respond, nonverbal, left gaze and declined to the right side.  Code stroke activated.  BP with EMS 170/70, glucose 101.  CT no acute abnormality, questionable slightly decreased right thalamic  hypodensity.  LKW: 9 AM TNK given?:  Yes after discussed with daughter [name] over the phone IR Thrombectomy? No, poor baseline mRS = 4 Modified Rankin Scale: 4-Needs assistance to walk and tend to bodily needs   Exam: Vitals:   08/11/21 1226 08/11/21 1230  BP: (!) 170/123 (!) 175/79  Pulse: 85 87  Resp: (!) 23 19  SpO2: 98% 94%     Pulse Rate:  [85-110] 87 (02/10 1230) Resp:  [12-25] 19 (02/10 1230) BP: (94-190)/(67-123) 175/79 (02/10 1230) SpO2:  [94 %-99 %] 94 % (02/10 1230) Weight:  [71.4 kg] 71.4 kg (02/10 1214)  General - Well nourished, well developed, in no apparent distress.  Ophthalmologic - fundi not visualized due to noncooperation.  Cardiovascular - Regular rhythm and rate.  Neuro - awake, eyes open, global aphasia, not answer questions, not following commands.  Left gaze preference, not able to pass midline, not blinking to visual threat on the right. No significant facial droop. Tongue protrusion not corporative. Bilateral UEs 3/5 against gravity seems symmetrical. Bilaterally LEs able to keep knee flexion and foot on bed position, seems symmetric. Sensation, coordination and gait not tested.    NIH Stroke Scale  Level Of Consciousness 0=Alert; keenly responsive 1=Arouse to minor stimulation 2=Requires repeated stimulation to arouse or movements to pain 3=postures or unresponsive 0  LOC Questions to Month and Age 27=Answers both questions correctly 1=Answers one question correctly or dysarthria/intubated/trauma/language barrier 2=Answers neither question correctly or aphasia 2  LOC Commands      -  Open/Close eyes     -Open/close grip     -Pantomime commands if communication barrier 0=Performs both tasks correctly 1=Performs one task correctly 2=Performs neighter task correctly 2  Best Gaze     -Only assess horizontal gaze 0=Normal 1=Partial gaze palsy 2=Forced deviation, or total gaze paresis 1  Visual 0=No visual loss 1=Partial hemianopia 2=Complete  hemianopia 3=Bilateral hemianopia (blind including cortical blindness) 2  Facial Palsy     -Use grimace if obtunded 0=Normal symmetrical movement 1=Minor paralysis (asymmetry) 2=Partial paralysis (lower face) 3=Complete paralysis (upper and lower face) 0  Motor  0=No drift for 10/5 seconds 1=Drift, but does not hit bed 2=Some antigravity effort, hits  bed 3=No effort against gravity, limb falls 4=No movement 0=Amputation/joint fusion Right Arm 0     Leg 2    Left Arm 0     Leg 2  Limb Ataxia     - FNT/HTS 0=Absent or does not understand or paralyzed or amputation/joint fusion 1=Present in one limb 2=Present in two limbs 0  Sensory 0=Normal 1=Mild to moderate sensory loss 2=Severe to total sensory loss or coma/unresponsive 0  Best Language 0=No aphasia, normal 1=Mild to moderate aphasia 2=Severe aphasia 3=Mute, global aphasia, or coma/unresponsive 3  Dysarthria 0=Normal 1=Mild to moderate 2=Severe, unintelligible or mute/anarthric 0=intubated/unable to test 2  Extinction/Neglect 0=No abnormality 1=visual/tactile/auditory/spatia/personal inattention/Extinction to bilateral simultaneous stimulation 2=Profound neglect/extinction more than 1 modality  2  Total   18      Imaging Reviewed:  CT HEAD CODE STROKE WO CONTRAST  Result Date: 08/11/2021 CLINICAL DATA:  Code stroke.  Neuro deficit, acute, stroke suspected EXAM: CT HEAD WITHOUT CONTRAST TECHNIQUE: Contiguous axial images were obtained from the base of the skull through the vertex without intravenous contrast. RADIATION DOSE REDUCTION: This exam was performed according to the departmental dose-optimization program which includes automated exposure control, adjustment of the mA and/or kV according to patient size and/or use of iterative reconstruction technique. COMPARISON:  07/18/2021. FINDINGS: Brain: Remote right frontal parietal infarct. Slightly increased hypoattenuation in the right thalamus. Otherwise, no evidence of  acute infarct by CT. Additional advanced patchy and confluent white matter hypoattenuation, nonspecific but compatible chronic microvascular ischemic disease. No mass lesion, midline shift, hydrocephalus, or acute hemorrhage. Vascular: No hyperdense vessel identified. Calcific intracranial atherosclerosis. Skull: No acute fracture. Sinuses/Orbits: Clear sinuses.  Unremarkable orbits. Other: No mastoid effusions. IMPRESSION: 1. Slightly increased hypoattenuation in the right thalamus. This could represent the sequela of advanced chronic microvascular ischemic disease, but acute infarct is difficult to exclude. An MRI could further evaluate if clinically indicated. 2. No acute hemorrhage. 3. Remote right frontoparietal infarct and similar advanced chronic microvascular ischemic disease. An MRI could provide more sensitive evaluation for acute infarct. Findings discussed with Dr. Roderic Palau via telephone at 11:42 a.m. Electronically Signed   By: Margaretha Sheffield M.D.   On: 08/11/2021 11:46     Labs reviewed in epic and pertinent values follow: Cre 1.06, Hb 11.4, platelet 236  Assessment:  88 year old female with history of hypertension, diabetes, Alzheimer disease, stroke in 08/2020 and TIA last month presented to ED for not to respond, nonverbal, left gaze and declined to the right side.  Last seen well 9 AM.  NIH score 18.  CT no acute abnormality, question right thalamic slightly increased hypodensity.  Patient has no current indication for TNK, I talked with patient daughter [name] over the phone, discussed with her about benefit and risks of TNK, she would like to proceed.  Patient not a candidate  for thrombectomy due to poor baseline with mRS = 4.    TNK was delayed as patient initially had no weight and we have to get patient change to another bed for weighing.  Then patient had high blood pressure over the goal, used labetalol and waited BP under goal before TNK.  I have explained to the patient daughter  [name] the nature of the patient's condition, the use of fibrinolytic agent, and the benefits to be reasonably expected compared with alternative approaches. However, there is a small but real risk of complication with this medication, including (if applicable) but not limited to permanent neurologic injury (paralysis, coma, etc.), bleeding in the brain or other part of the body, need for blood product transfusion, drug reactions, and even loss of life. I have also indicated that with any procedure there is always the possibility of an unexpected complication. All questions were answered prior to initiating therapy and she express understanding of the treatment plan and consent to the treatment.   Recommendations:  Will admit to [facility]N ICU for routine post IV thrombolytics care  Check blood pressure and NIHSS every 15 min for 2 h, then every 30 min for 6 h, and finally every hour for 16 h BP goal < 180/105 CTA head and neck pending CT or MRI brain 24 hours post thrombolytics Stat CT head without contrast if acute neuro changes NPO until swallowing screen performed and passed No antiplatelet agents or anticoagulants (including heparin for DVT prophylaxis) in first 24 hours No Foley catheter, nasogastric tube, arterial catheter or central venous catheter for 24 hours, unless absolutely necessary Telemetry Euglycemia  Avoid hyperthermia, PRN acetaminophen DVT prophylaxis with SCDs Discussed with Dr. Roderic Palau ED physician  Consult Participants: RNs, patient, tele stroke coordinator Location of the provider: [facility] Location of the patient: [facility]  Time Code Stroke Page received:  1140 Time neurologist arrived:  1142 Time NIHSS completed: 1202 TNK decision: 12:08    This consult was provided via telemedicine with 2-way video and audio communication. The patient/family was informed that care would be provided in this way and agreed to receive care in this manner.   This patient is receiving care for  possible acute neurological changes. There was 60 minutes of care by this provider at the time of service, including time for direct evaluation via telemedicine, review of medical records, imaging studies and discussion of findings with providers, the patient and/or family.  Rosalin Hawking, MD PhD Stroke Neurology 08/11/2021 12:39 PM  This patient is critically ill due to stroke symptoms needing TNK, hypertensive urgency and at significant risk of neurological worsening, death form recurrent stroke, hemorrhagic transformation, bleeding from TNK, hypertensive encephalopathy. This patient's care requires constant monitoring of vital signs, hemodynamics, respiratory and cardiac monitoring, review of multiple databases, neurological assessment, discussion with family, other specialists and medical decision making of high complexity. I spent 60 minutes of neurocritical care time in the care of this patient.

## 2021-08-11 NOTE — ED Notes (Signed)
Pt transported to CT scan.

## 2021-08-11 NOTE — ED Notes (Signed)
Verbal consent to administer blood products given by son. Pt altered at this time

## 2021-08-11 NOTE — ED Notes (Signed)
Called [facility] to speak with nurse for more information d/t lack of info received from EMS. Spoke with York Pellant who states pt was working with pt this morning at 9am following commands appropriately. At 10:30-10:50 pt was noted to be leaning to left side, nonverbal, and not following commands. Confused at baseline but normally verbal and able to follow instructions. Uses w/c at baseline

## 2021-08-11 NOTE — ED Notes (Addendum)
Neurologist responded to confirm CT repeat which has already been ordered. CT called and reports they are in the process of getting another pt off the bed at this time.

## 2021-08-11 NOTE — Progress Notes (Signed)
Called by tele stroke coordinator Kimmie at 2:17pm that pt was more drowsy sleepy. Ordered stat CT head which showed right thalamic ICH post TNK. Currently on cleviprex, requested BP goal < 140, and ordered TXA and cryo for reversal. Will repeat CT in 4-6 hours.   I beamed in again. Pt lying in bed, eyes closed, not open on voice or pain stimulation. Not answer questions but able to moan with pain stimulation. Still has left gaze palsy, not blinking to visual threat, b/l pupil equal but left pupil irregular shape per RN. Mild left facial droop which could be chronic per previous note and daughter at bedside. RUE withdraw to pain, BLU on knee flexion and foot on bed position without drift. LUE slight withdraw to pain, weaker than left. Pt dose have left sided chronic weakness per note and daughter at bedside. Sensation, coordination and gait not tested.  Discussed with daughter [name] over the tele device. She stated that pt has not been doing well for the last 2 years, wheelchair bound, low quality of life. Pt is DNR, and daughter would against any intubation also if pt had respiratory failure. Will continue current treatment without escalation.   Rosalin Hawking, MD PhD Stroke Neurology 08/11/2021 4:50 PM  This patient is critically ill due to [employer] post TNK, stroke, hypertensive emergency and at significant risk of neurological worsening, death form recurrent stroke, brain herniation, hematoma expansion. This patient's care requires constant monitoring of vital signs, hemodynamics, respiratory and cardiac monitoring, review of multiple databases, neurological assessment, discussion with family, other specialists and medical decision making of high complexity. I spent 30 minutes of neurocritical care time in the care of this patient.

## 2021-08-12 ENCOUNTER — Inpatient Hospital Stay (HOSPITAL_COMMUNITY): Payer: Medicare Other

## 2021-08-12 ENCOUNTER — Other Ambulatory Visit: Payer: Self-pay

## 2021-08-12 LAB — CBC
HCT: 31.6 % — ABNORMAL LOW (ref 36.0–46.0)
Hemoglobin: 10.7 g/dL — ABNORMAL LOW (ref 12.0–15.0)
MCH: 29.4 pg (ref 26.0–34.0)
MCHC: 33.9 g/dL (ref 30.0–36.0)
MCV: 86.8 fL (ref 80.0–100.0)
Platelets: 255 10*3/uL (ref 150–400)
RBC: 3.64 MIL/uL — ABNORMAL LOW (ref 3.87–5.11)
RDW: 13.2 % (ref 11.5–15.5)
WBC: 11 10*3/uL — ABNORMAL HIGH (ref 4.0–10.5)
nRBC: 0 % (ref 0.0–0.2)

## 2021-08-12 LAB — MRSA NEXT GEN BY PCR, NASAL: MRSA by PCR Next Gen: DETECTED — AB

## 2021-08-12 LAB — BPAM CRYOPRECIPITATE
Blood Product Expiration Date: 202302110029
ISSUE DATE / TIME: 202302102102
Unit Type and Rh: 5100

## 2021-08-12 LAB — BASIC METABOLIC PANEL
Anion gap: 14 (ref 5–15)
BUN: 18 mg/dL (ref 8–23)
CO2: 19 mmol/L — ABNORMAL LOW (ref 22–32)
Calcium: 9.1 mg/dL (ref 8.9–10.3)
Chloride: 108 mmol/L (ref 98–111)
Creatinine, Ser: 1.25 mg/dL — ABNORMAL HIGH (ref 0.44–1.00)
GFR, Estimated: 41 mL/min — ABNORMAL LOW (ref >60)
Glucose, Bld: 120 mg/dL — ABNORMAL HIGH (ref 70–99)
Potassium: 4.6 mmol/L (ref 3.5–5.1)
Sodium: 141 mmol/L (ref 135–145)

## 2021-08-12 LAB — HEMOGLOBIN A1C
Hgb A1c MFr Bld: 5.4 % (ref 4.8–5.6)
Mean Plasma Glucose: 108.28 mg/dL

## 2021-08-12 LAB — PREPARE CRYOPRECIPITATE: Unit division: 0

## 2021-08-12 LAB — LDL CHOLESTEROL, DIRECT: Direct LDL: 64.9 mg/dL (ref 0–99)

## 2021-08-12 LAB — LIPID PANEL
Cholesterol: 115 mg/dL (ref 0–200)
HDL: 20 mg/dL — ABNORMAL LOW (ref >40)
LDL Cholesterol: UNDETERMINED mg/dL (ref 0–99)
Total CHOL/HDL Ratio: 5.8 RATIO
Triglycerides: 424 mg/dL — ABNORMAL HIGH (ref <150)
VLDL: UNDETERMINED mg/dL (ref 0–40)

## 2021-08-12 LAB — FIBRINOGEN: Fibrinogen: 594 mg/dL — ABNORMAL HIGH (ref 210–475)

## 2021-08-12 IMAGING — MR MR HEAD W/O CM
10 of 11 series · 42 of 48 positions shown · non-contrast
Comparison: Head CT from yesterday

CLINICAL DATA: Stroke follow-up

EXAM:
MRI HEAD WITHOUT CONTRAST
TECHNIQUE: Multiplanar, multiecho pulse sequences of the brain and surrounding
structures were obtained without intravenous contrast.

[Series 5: DWI · axial · 3.0mm · 0.88mm/px · z∈[-57,+84]mm · 9 of 96 slices shown (1 of 4)]
[im 1/96]
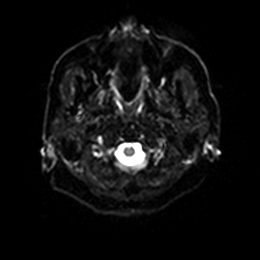
[im 12/96]
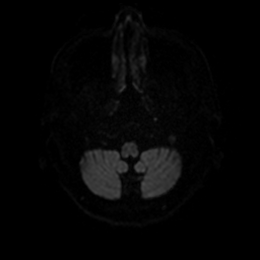
[im 24/96]
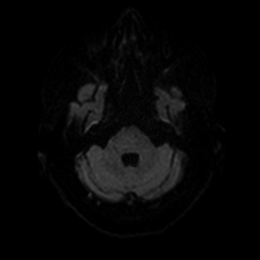
[im 36/96]
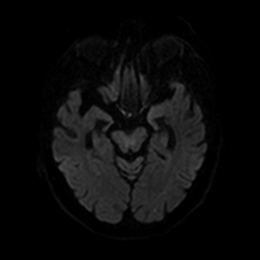
[im 48/96]
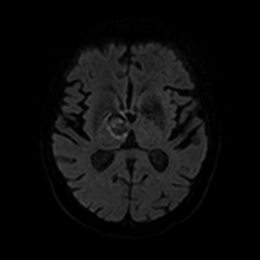
[im 60/96]
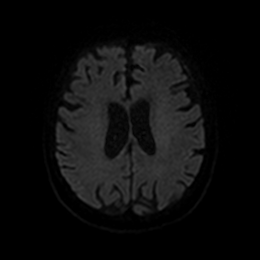
[im 72/96]
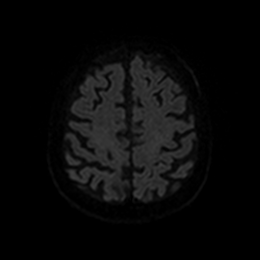
[im 84/96]
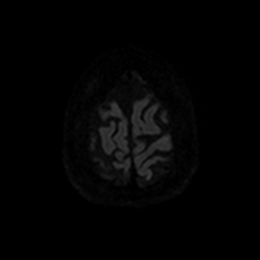
[im 96/96]
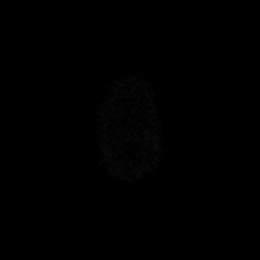

[Series 6: DWI · axial · 3.0mm · 0.88mm/px · z∈[-57,+84]mm · 4 of 48 slices shown (2 of 4)]
[im 1/48]
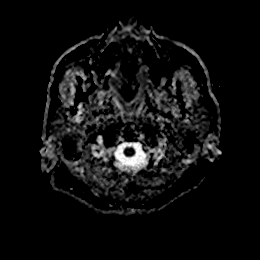
[im 16/48]
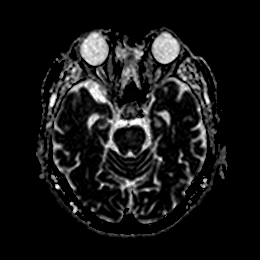
[im 32/48]
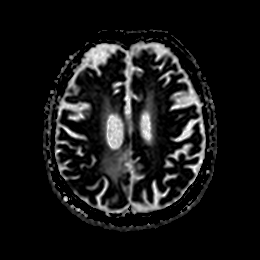
[im 48/48]
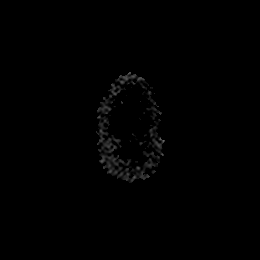

[Series 7: DWI · coronal · 4.0mm · 0.88mm/px · 6 of 66 slices shown (3 of 4)]
[im 1/66]
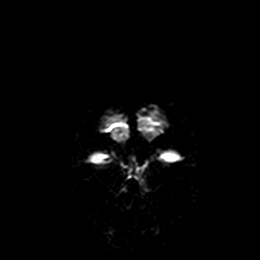
[im 14/66]
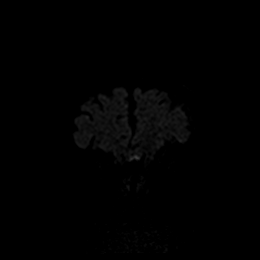
[im 27/66]
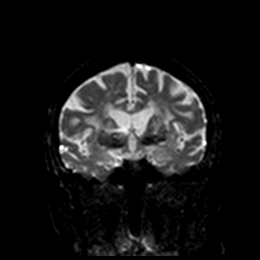
[im 40/66]
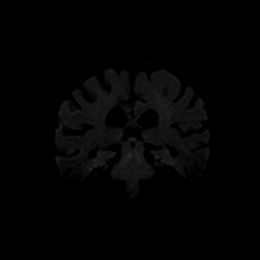
[im 53/66]
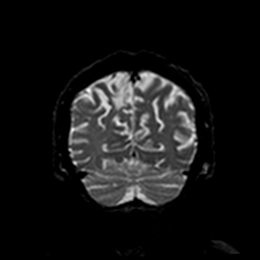
[im 66/66]
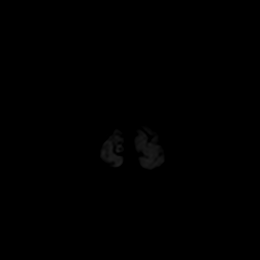

[Series 8: DWI · coronal · 4.0mm · 0.88mm/px · 3 of 33 slices shown (4 of 4)]
[im 1/33]
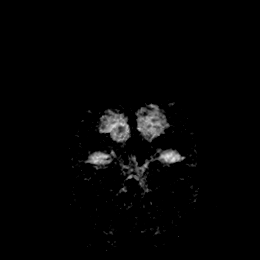
[im 17/33]
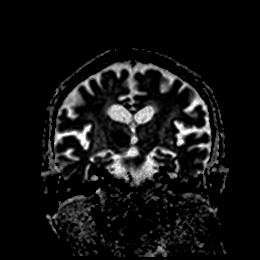
[im 33/33]
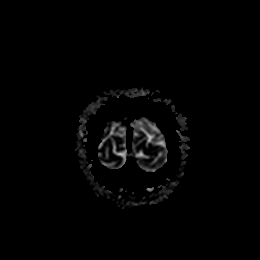

[Series 9: T1 · sagittal · 5.0mm · 0.75mm/px · 2 of 23 slices shown]
[im 1/23]
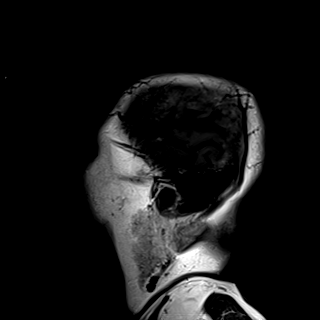
[im 23/23]
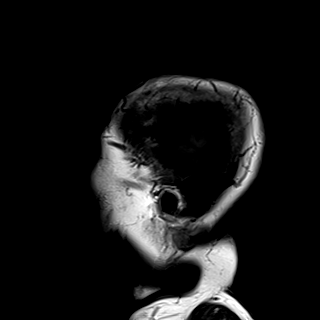

[Series 10: T2 · axial · 5.0mm · 0.72mm/px · z∈[-58,+85]mm · 2 of 25 slices shown (1 of 2)]
[im 1/25]
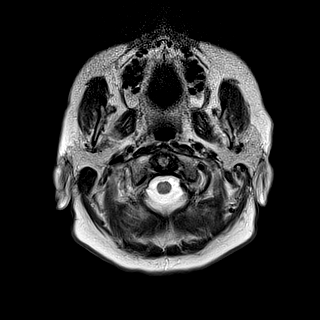
[im 25/25]
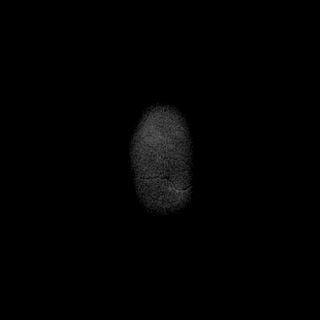

[Series 11: FLAIR · axial · 5.0mm · 0.45mm/px · z∈[-59,+85]mm · 2 of 25 slices shown]
[im 1/25]
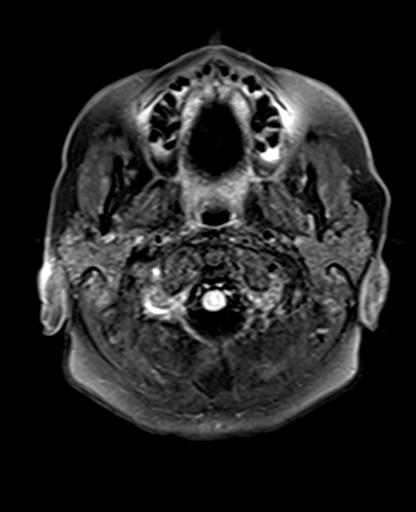
[im 25/25]
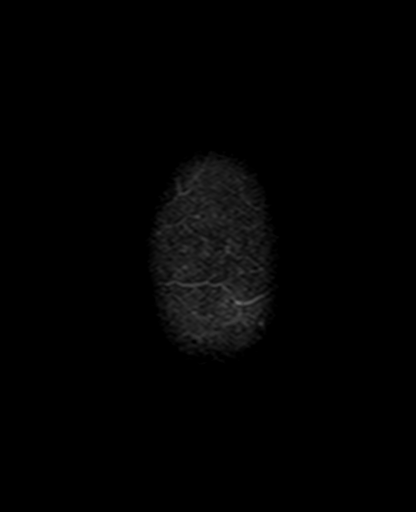

[Series 13: pha_images · axial · 3.0mm · 0.90mm/px · z∈[-77,+99]mm · 5 of 57 slices shown]
[im 1/57]
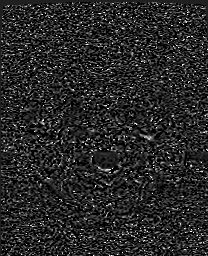
[im 15/57]
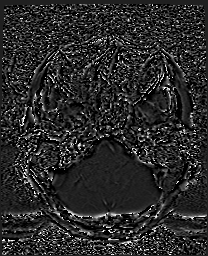
[im 29/57]
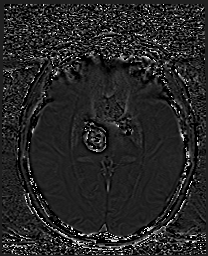
[im 43/57]
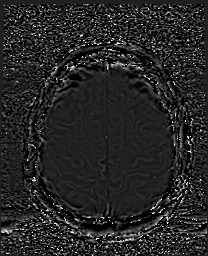
[im 57/57]
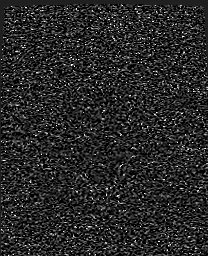

[Series 14: swi_images · axial · 3.0mm · 0.90mm/px · z∈[-77,+99]mm · 6 of 60 slices shown]
[im 1/60]
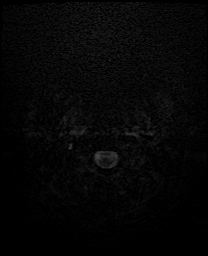
[im 12/60]
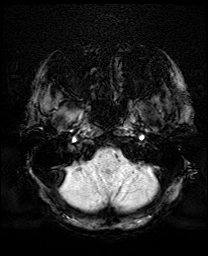
[im 24/60]
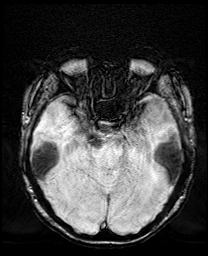
[im 36/60]
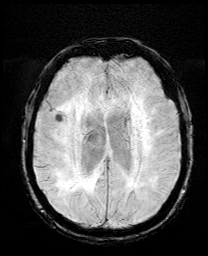
[im 48/60]
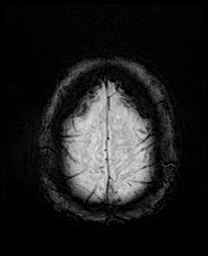
[im 60/60]
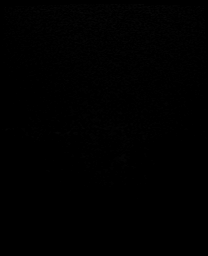

[Series 17: T2 · coronal · 5.0mm · 0.34mm/px · 3 of 29 slices shown (2 of 2)]
[im 1/29]
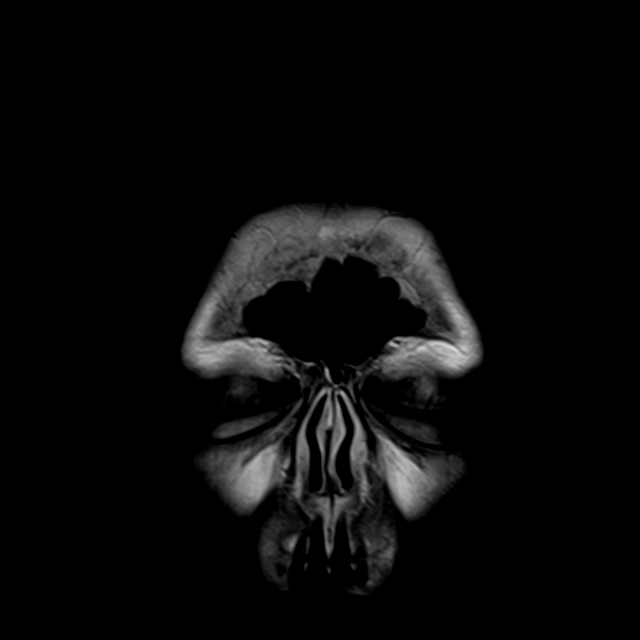
[im 15/29]
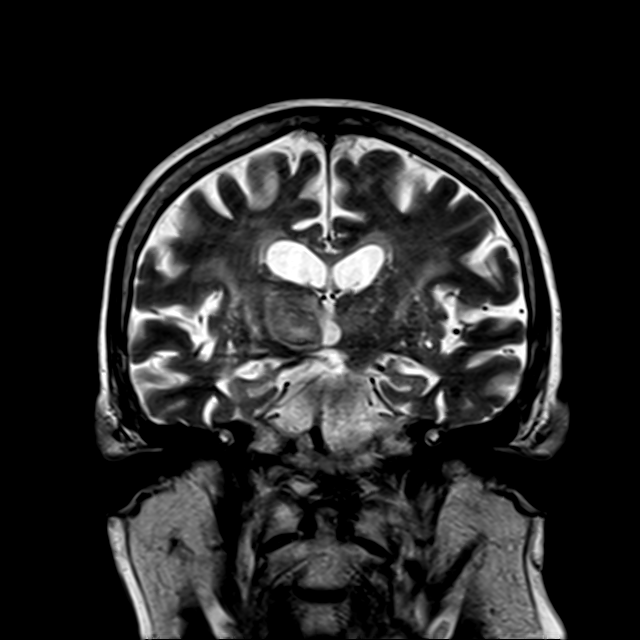
[im 29/29]
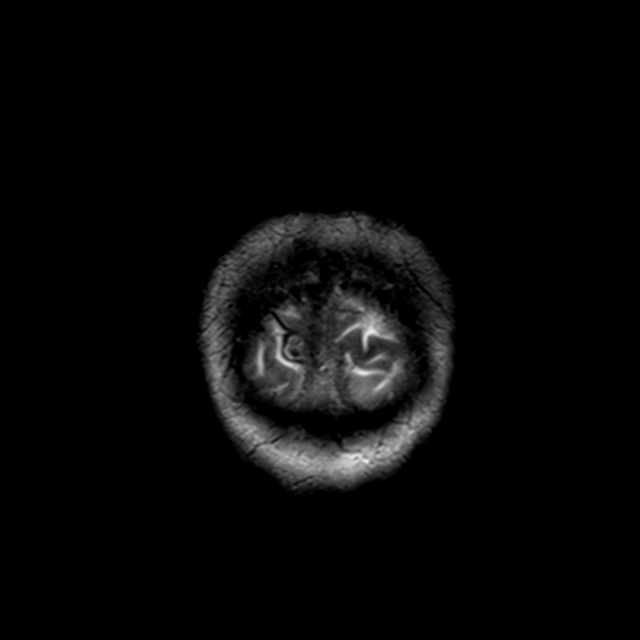

[42 of 48 positions shown; findings below may reference images not displayed]

FINDINGS: Brain: Known acute hematoma in the medial and anterior right
thalamus, unchanged when allowing for modality differences. Rim of
diffusion hyperintensity is attributed to susceptibility. No
evidence of regional infarction or mass. No intraventricular
decompression or hydrocephalus.

Chronic small vessel ischemia which is confluent in the cerebral
white matter and pons. Remote parasagittal right parietal cortex
infarct. Small remote right cerebellar infarct.

Generalized brain atrophy in keeping with age.

Chronic microhemorrhages in the deep gray and pons primarily,
attributed to chronic small vessel ischemia. Mild superficial
siderosis near the right cerebral cortex infarct.

Vascular: Absent right vertebral flow void correlating with prior
CTA.

Skull and upper cervical spine: Normal marrow signal. Cervical spine
degeneration with C2-3 facet ankylosis on the left.

Sinuses/Orbits: No acute finding.  Bilateral cataract resection
IMPRESSION: Bland appearing right thalamic hematoma. There is a background of
advanced chronic small vessel disease and remote hypertensive
pattern hemorrhages.

## 2021-08-12 MED ORDER — CHLORHEXIDINE GLUCONATE 0.12 % MT SOLN
15.0000 mL | Freq: Two times a day (BID) | OROMUCOSAL | Status: DC
  Administered 2021-08-12 – 2021-08-17 (×10): 15 mL via OROMUCOSAL
  Filled 2021-08-12 (×8): qty 15

## 2021-08-12 MED ORDER — ORAL CARE MOUTH RINSE
15.0000 mL | Freq: Two times a day (BID) | OROMUCOSAL | Status: DC
  Administered 2021-08-12 – 2021-08-17 (×11): 15 mL via OROMUCOSAL

## 2021-08-12 MED ORDER — MUPIROCIN 2 % EX OINT
1.0000 "application " | TOPICAL_OINTMENT | Freq: Two times a day (BID) | CUTANEOUS | Status: AC
  Administered 2021-08-12 – 2021-08-16 (×9): 1 via NASAL
  Filled 2021-08-12 (×2): qty 22

## 2021-08-12 MED ORDER — CHLORHEXIDINE GLUCONATE CLOTH 2 % EX PADS
6.0000 | MEDICATED_PAD | Freq: Every day | CUTANEOUS | Status: DC
  Administered 2021-08-12 – 2021-08-16 (×5): 6 via TOPICAL

## 2021-08-12 NOTE — Progress Notes (Signed)
°  Transition of Care [facility]) Screening Note   Patient Details  Name: Julia [name] Date of Birth: 17-Dec-1927   [facility]) CM/SW Contact:    Alfredia Ferguson, LCSW Phone Number: 08/12/2021, 8:33 AM    Transition of Care Department [facility]) has reviewed patient and noted no immediate TOC needs pending continued medical work-up. TOC team will continue to monitor patient advancement through interdisciplinary progression rounds to support any identified discharge supports as needed. If new patient transition needs arise, please place a TOC consult or [facility].

## 2021-08-12 NOTE — Progress Notes (Signed)
PT Cancellation Note  Patient Details Name: Julia [name] MRN: [id number] DOB: February 06, 1928   Cancelled Treatment:    Reason Eval/Treat Not Completed: Active bedrest order. Will check back later in the day to see if activity orders are advanced and follow as pt medically ready.   Leighton Roach, [place]  Pager [phone] Office [place] 08/12/2021, 8:10 AM

## 2021-08-12 NOTE — Evaluation (Signed)
Physical Therapy Evaluation Patient Details Name: Collen Hostler MRN: [id number] DOB: [birth year] Today's Date: 08/12/2021  History of Present Illness  Pt is 88 yo female from SNF where she was able to transfer with assist who was found unresponsive with L gaze after her therapy session. MRI neg for acute changes but showed remote parietal infarct.  PMH: multiple CVA's and TIA's, Alzheimers, anxiety, HTN.  Clinical Impression  Pt admitted with above diagnosis. Pt very lethargic on eval. Bed placed in chair position which did not arouse pt, nor did PROM to BLE's. However when pt brought to dangle EOB, she opened her eyes and tried to speak for a short time. Noted L upward gaze deviation. Pt with tightness B knees with resistance to extension, may be her baseline though given that she was w/c bound. Pt will need SNF level care at d/c.  Pt currently with functional limitations due to the deficits listed below (see PT Problem List). Pt will benefit from skilled PT to increase their independence and safety with mobility to allow discharge to the venue listed below.          Recommendations for follow up therapy are one component of a multi-disciplinary discharge planning process, led by the attending physician.  Recommendations may be updated based on patient status, additional functional criteria and insurance authorization.  Follow Up Recommendations Skilled nursing-short term rehab (<3 hours/day)    Assistance Recommended at Discharge Frequent or constant Supervision/Assistance  Patient can return home with the following  Two people to help with walking and/or transfers;Two people to help with bathing/dressing/bathroom;Assistance with feeding;Assistance with cooking/housework;Direct supervision/assist for medications management;Direct supervision/assist for financial management;Assist for transportation;Help with stairs or ramp for entrance    Equipment Recommendations None recommended by PT   Recommendations for Other Services       Functional Status Assessment Patient has had a recent decline in their functional status and demonstrates the ability to make significant improvements in function in a reasonable and predictable amount of time.     Precautions / Restrictions Precautions Precautions: Fall Restrictions Weight Bearing Restrictions: No      Mobility  Bed Mobility Overal bed mobility: Needs Assistance Bed Mobility: Supine to Sit     Supine to sit: Total assist, HOB elevated     General bed mobility comments: pt brought to EOB by therapist. Pt did not assist with rising however, once EOB, pt initiated correction of posterior and R lean.    Transfers Overall transfer level: Needs assistance                 General transfer comment: pt too lethargic to sit in chair. Bed placed in chair position for 10 minutes during LE ROM, did not rouse pt    Ambulation/Gait               General Gait Details: non ambulatory at baseline  Stairs            Wheelchair Mobility    Modified Rankin (Stroke Patients Only) Modified Rankin (Stroke Patients Only) Pre-Morbid Rankin Score: Moderately severe disability Modified Rankin: Severe disability     Balance Overall balance assessment: Needs assistance Sitting-balance support: Feet unsupported, No upper extremity supported Sitting balance-Leahy Scale: Poor Sitting balance - Comments: min A to maintain sitting, pt did open eyes and try to speak once when sitting up EOB Postural control: Posterior lean, Right lateral lean  Pertinent Vitals/Pain Pain Assessment Pain Assessment: Faces Faces Pain Scale: Hurts little more Pain Location: LE's with mvmt Pain Descriptors / Indicators: Grimacing Pain Intervention(s): Limited activity within patient's tolerance, Monitored during session    Home Living Family/patient expects to be discharged to:: Skilled  nursing facility                        Prior Function Prior Level of Function : Needs assist  Cognitive Assist : Mobility (cognitive);ADLs (cognitive)     Physical Assist : Mobility (physical);ADLs (physical) Mobility (physical): Bed mobility;Transfers;Gait;Stairs ADLs (physical): Feeding;Grooming;Bathing;Dressing;Toileting;IADLs Mobility Comments: Per chart patient was mostly w/c bound and assisted for transfers ADLs Comments: assisted by ALF staff     Hand Dominance   Dominant Hand: Right    Extremity/Trunk Assessment   Upper Extremity Assessment Upper Extremity Assessment: Defer to OT evaluation    Lower Extremity Assessment Lower Extremity Assessment: Generalized weakness;RLE deficits/detail;LLE deficits/detail;Difficult to assess due to impaired cognition RLE Deficits / Details: pt with increased tightness B knees, noted that pt is w/c bound so this could be her baseline. Pt resisted extension of B knees.    Cervical / Trunk Assessment Cervical / Trunk Assessment: Kyphotic  Communication   Communication: Expressive difficulties;Receptive difficulties  Cognition Arousal/Alertness: Lethargic Behavior During Therapy: Flat affect Overall Cognitive Status: Impaired/Different from baseline                                 General Comments: pt maintained eyes closed except one time when she opened them and gaze was L and superior. Roused to calling her "[place]" and attempted speaking once.        General Comments General comments (skin integrity, edema, etc.): VSS on RA    Exercises Low Level/ICU Exercises Ankle Circles/Pumps: PROM, Both, 10 reps, Seated Heel Slides: PROM, Both, 10 reps, Seated   Assessment/Plan    PT Assessment Patient needs continued PT services  PT Problem List Decreased strength;Decreased balance;Decreased mobility;Decreased cognition       PT Treatment Interventions DME instruction;Therapeutic activities;Functional  mobility training;Therapeutic exercise;Balance training;Neuromuscular re-education;Cognitive remediation;Patient/family education;Wheelchair mobility training    PT Goals (Current goals can be found in the Care Plan section)  Acute Rehab PT Goals Patient Stated Goal: unable to state PT Goal Formulation: Patient unable to participate in goal setting Time For Goal Achievement: 08/26/21 Potential to Achieve Goals: Fair    Frequency Min 3X/week     Co-evaluation               AM-PAC PT "6 Clicks" Mobility  Outcome Measure Help needed turning from your back to your side while in a flat bed without using bedrails?: Total Help needed moving from lying on your back to sitting on the side of a flat bed without using bedrails?: Total Help needed moving to and from a bed to a chair (including a wheelchair)?: Total Help needed standing up from a chair using your arms (e.g., wheelchair or bedside chair)?: Total Help needed to walk in hospital room?: Total Help needed climbing 3-5 steps with a railing? : Total 6 Click Score: 6    End of Session   Activity Tolerance: Patient limited by lethargy Patient left: in bed;with call bell/phone within reach;with bed alarm set Nurse Communication: Mobility status PT Visit Diagnosis: Muscle weakness (generalized) (M62.81)    Time: 1062-6948 PT Time Calculation (min) (ACUTE ONLY): 16 min   Charges:  PT Evaluation $PT Eval Moderate Complexity: 1 Mod          [place]  Pager [phone] Office [place] 08/12/2021, 1:42 PM

## 2021-08-12 NOTE — Progress Notes (Signed)
OT Cancellation Note  Patient Details Name: Julia [name] MRN: [id number] DOB: February 06, 1928   Cancelled Treatment:    Reason Eval/Treat Not Completed: Fatigue/lethargy limiting ability to participate.  OT to continue efforts as appropriate.    Guthrie Lemme D Nash Bolls 08/12/2021, 10:38 AM

## 2021-08-12 NOTE — Progress Notes (Addendum)
STROKE TEAM PROGRESS NOTE   INTERVAL HISTORY Her son is at the bedside.  History obtained from son. Patient was at [facility]. She has been in a wheelchair for a few years, was able to communicate and feed herself. Could stand and pivot.  She presented with sudden onset of unresponsiveness with left gaze deviation and was seen by telestroke and given IV TNK after careful discussion of risk-benefit with family.  Patient has not shown significant neurological improvement yet.  She had neurological worsening after TNK stat CT scan showed a right thalamic hematoma.  TNK was reversed with cryoprecipitate and follow-up MRI scan shows stable appearance of the hematoma with mild cytotoxic edema but no intraventricular hemorrhage, hydrocephalus or brain shift.  Blood pressure has been adequately controlled.  Neurological exam remains unchanged without significant improvement Vitals:   08/12/21 0845 08/12/21 0900 08/12/21 0915 08/12/21 0930  BP: (!) 137/47 (!) 136/49 (!) 136/49 (!) 136/49  Pulse: 83 84 83 84  Resp: 18 19 15 16   Temp:      TempSrc:      SpO2: 100% 99% 99% 99%  Weight:      Height:       CBC:  Recent Labs  Lab 08/11/21 1157 08/12/21 0201  WBC 9.3 11.0*  NEUTROABS 7.1  --   HGB 11.4* 10.7*  HCT 35.5* 31.6*  MCV 88.8 86.8  PLT 236 284   Basic Metabolic Panel:  Recent Labs  Lab 08/11/21 1157 08/12/21 0201  NA 140 141  K 4.0 4.6  CL 108 108  CO2 24 19*  GLUCOSE 75 120*  BUN 21 18  CREATININE 1.06* 1.25*  CALCIUM 9.5 9.1   Lipid Panel:  Recent Labs  Lab 08/12/21 0201  CHOL 115  TRIG 424*  HDL 20*  CHOLHDL 5.8  VLDL UNABLE TO CALCULATE IF TRIGLYCERIDE OVER 400 mg/dL  LDLCALC UNABLE TO CALCULATE IF TRIGLYCERIDE OVER 400 mg/dL   HgbA1c:  Recent Labs  Lab 08/12/21 0201  HGBA1C 5.4   Urine Drug Screen: No results for input(s): LABOPIA, COCAINSCRNUR, LABBENZ, AMPHETMU, THCU, LABBARB in the last 168 hours.  Alcohol Level  Recent Labs  Lab  08/11/21 1157  ETH <10    IMAGING past 24 hours CT ANGIO HEAD NECK W WO CM  Result Date: 08/11/2021 CLINICAL DATA:  Stroke/TIA, determine embolic source EXAM: CT ANGIOGRAPHY HEAD AND NECK TECHNIQUE: Multidetector CT imaging of the head and neck was performed using the standard protocol during bolus administration of intravenous contrast. Multiplanar CT image reconstructions and MIPs were obtained to evaluate the vascular anatomy. Carotid stenosis measurements (when applicable) are obtained utilizing NASCET criteria, using the distal internal carotid diameter as the denominator. RADIATION DOSE REDUCTION: This exam was performed according to the departmental dose-optimization program which includes automated exposure control, adjustment of the mA and/or kV according to patient size and/or use of iterative reconstruction technique. CONTRAST:  75mL OMNIPAQUE IOHEXOL 350 MG/ML SOLN COMPARISON:  MRA 07/19/2021.  Same day CT code stroke. FINDINGS: CTA NECK FINDINGS Aortic arch: Atherosclerosis of the great vessel origins, which are patent. Right carotid system: Patent. Atherosclerosis at the carotid bifurcation with approximately 50% stenosis of the proximal ICA. Left carotid system: Atherosclerosis at the carotid bifurcation with approximately 40% stenosis of the ICA origin. Vertebral arteries: Multifocal severe atherosclerosis the right V2 vertebral artery, nearly occlusive also, multifocal severe stenosis of the right V3 vertebral artery with occlusion of the vertebral artery at its dural margin. Left vertebral artery is patent without  hemodynamically significant stenosis. Skeleton: Moderate to severe multilevel degenerative change. Other neck: No evidence of acute abnormality on limited assessment. Multiple thyroid nodules, measuring up to 9 mm and poorly evaluated due to motion. No follow-up imaging recommended (ref: J Am Coll Radiol. 2015 Feb;12(2): 143-50). Upper chest: Approximately 1.8 cm ground-glass  opacity in the imaged right upper lobe and 4 mm area consolidation. Review of the MIP images confirms the above findings CTA HEAD FINDINGS Anterior circulation: Bilateral intracranial ICAs are patent with mild to moderate bilateral paraclinoid stenosis. Bilateral M1 MCAs are patent with mild narrowing of the distal left M1 MCA. Motion limited evaluation of the more distal MCA branches with multifocal severe right M2 MCA stenosis. Also, suspected severe stenosis of a small left M2 MCA branch anteriorly (for example series 6, image 92 small right A1 ACA, likely in part congenital given prominent left A1 ACA. Moderate left A2 ACA stenosis. Severe distal A3/A4 ACA stenosis bilaterally with intermittent/poor distal opacification. Posterior circulation: The intradural right vertebral artery is largely non-opacified with minimal irregular opacification. Intradural left vertebral artery is patent. Small (1-2 mm) outpouching at the right proximal basilar artery may represent retrograde flow into the occluded right V4 vertebral artery versus aneurysm. Basilar artery is patent with mild atherosclerotic narrowing. Bilateral PCAs are patent with moderate to severe P2 PCA stenosis on the left. Venous sinuses: As permitted by contrast timing, patent. Review of the MIP images confirms the above findings IMPRESSION: 1. Multifocal severe stenosis of the right vertebral artery in the neck with occlusion at its dural margin and largely occluded intradurally. 2. Severe distal A3/A4 ACA stenosis bilaterally with intermittent/poor distal opacification. 3. Suspected severe bilateral M2 MCA stenosis, although evaluation is limited by motion. 4. Mild-to-moderate bilateral paraclinoid ICA stenosis. 5. Moderate to severe left P2 PCA stenosis. 6. Bilateral carotid bifurcation with proximally 50% right and 40% left proximal ICA stenosis. 7. Small (1-2 mm) outpouching at the right proximal basilar artery is suspicious for an aneurysm, although  retrograde flow into the occluded distal right vertebral artery is a differential consideration. 8. Approximately 1.8 cm ground-glass opacity in the imaged right upper lobe and 4 mm area consolidation. These areas may be infectious/inflammatory; however, recommend follow-up CT chest in 3-6 months to ensure resolution and exclude malignancy. If areas persist, subsequent management will be based upon the most suspicious nodule(s). This recommendation follows the consensus statement: Guidelines for Management of Incidental Pulmonary Nodules Detected on CT Images: From the Fleischner Society 2017; Radiology 2017; 284:228-243. Electronically Signed   By: Margaretha Sheffield M.D.   On: 08/11/2021 13:39   CT HEAD WO CONTRAST (5MM)  Result Date: 08/11/2021 CLINICAL DATA:  Follow-up examination for stroke. EXAM: CT HEAD WITHOUT CONTRAST TECHNIQUE: Contiguous axial images were obtained from the base of the skull through the vertex without intravenous contrast. RADIATION DOSE REDUCTION: This exam was performed according to the departmental dose-optimization program which includes automated exposure control, adjustment of the mA and/or kV according to patient size and/or use of iterative reconstruction technique. COMPARISON:  CT from earlier the same day. FINDINGS: Brain: Previously identified intraparenchymal hemorrhage centered at the right thalamus again seen, measuring 2.4 x 1.9 x 1.5 cm (estimated volume 3.5 mL). This is relatively similar to previous. Minimal localized edema without significant regional mass effect. No intraventricular extension or other complicating features. No midline shift. No other acute intracranial hemorrhage or large vessel territory infarct. Underlying atrophy with chronic microvascular ischemic disease again noted. Chronic posterior right ACA distribution infarct noted  as well. No mass lesion or extra-axial fluid collection. Vascular: Calcified atherosclerosis present at the skull base.  Probable small lateral residual contrast within the intracranial vasculature. No convincing new hyperdense vessel. Skull: Scalp soft tissues and calvarium demonstrate no new finding. Sinuses/Orbits: Globes orbital soft tissues demonstrate no acute finding. Mild mucosal thickening noted within the ethmoidal air cells. Paranasal sinuses are otherwise clear. No mastoid effusion. Other: None. IMPRESSION: 1. No significant interval change in size of right thalamic intraparenchymal hemorrhage, measuring 2.4 x 1.9 x 1.5 cm (estimated volume 3.5 mL). Minimal localized edema without significant regional mass effect. No intraventricular extension or other complicating features. 2. No other new acute intracranial abnormality. Electronically Signed   By: Jeannine Boga M.D.   On: 08/11/2021 21:45   CT Head Wo Contrast  Result Date: 08/11/2021 CLINICAL DATA:  Follow-up stroke, less responsive, EXAM: CT HEAD WITHOUT CONTRAST TECHNIQUE: Contiguous axial images were obtained from the base of the skull through the vertex without intravenous contrast. RADIATION DOSE REDUCTION: This exam was performed according to the departmental dose-optimization program which includes automated exposure control, adjustment of the mA and/or kV according to patient size and/or use of iterative reconstruction technique. COMPARISON:  Earlier exam of 08/11/2021 FINDINGS: Brain: Interval acute hemorrhage within the RIGHT thalamus extending into internal capsule, area of hemorrhage measuring 2.7 x 1.9 x 1.6 cm. Underlying atrophy and small vessel chronic ischemic changes. No additional hemorrhage or infarct. No extra-axial fluid collections. No midline shift or significant mass effect. Vascular: Atherosclerotic calcification of internal carotid arteries at skull base mild residual contrast within the vessels from earlier CTA. Skull: Intact, demineralized Sinuses/Orbits: Clear Other: N/A IMPRESSION: Interval acute hemorrhage within the RIGHT  thalamus extending into internal capsule measuring 2.7 x 1.9 x 1.6 cm. Atrophy with small vessel chronic ischemic changes. Critical Value/emergent results were discussed with Dr. Roderic Palau on 08/11/2021 at 1453 hours. Electronically Signed   By: Lavonia Dana M.D.   On: 08/11/2021 14:58   MR BRAIN WO CONTRAST  Result Date: 08/12/2021 CLINICAL DATA:  Stroke follow-up EXAM: MRI HEAD WITHOUT CONTRAST TECHNIQUE: Multiplanar, multiecho pulse sequences of the brain and surrounding structures were obtained without intravenous contrast. COMPARISON:  Head CT from yesterday FINDINGS: Brain: Known acute hematoma in the medial and anterior right thalamus, unchanged when allowing for modality differences. Rim of diffusion hyperintensity is attributed to susceptibility. No evidence of regional infarction or mass. No intraventricular decompression or hydrocephalus. Chronic small vessel ischemia which is confluent in the cerebral white matter and pons. Remote parasagittal right parietal cortex infarct. Small remote right cerebellar infarct. Generalized brain atrophy in keeping with age. Chronic microhemorrhages in the deep gray and pons primarily, attributed to chronic small vessel ischemia. Mild superficial siderosis near the right cerebral cortex infarct. Vascular: Absent right vertebral flow void correlating with prior CTA. Skull and upper cervical spine: Normal marrow signal. Cervical spine degeneration with C2-3 facet ankylosis on the left. Sinuses/Orbits: No acute finding.  Bilateral cataract resection IMPRESSION: Criss Rosales appearing right thalamic hematoma. There is a background of advanced chronic small vessel disease and remote hypertensive pattern hemorrhages. Electronically Signed   By: Jorje Guild M.D.   On: 08/12/2021 06:21   DG CHEST PORT 1 VIEW  Result Date: 08/11/2021 CLINICAL DATA:  Fever. EXAM: PORTABLE CHEST 1 VIEW COMPARISON:  07/25/2020 FINDINGS: The cardiac silhouette is upper limits of normal in size.  Aortic atherosclerosis is noted. Telemetry leads overlie the right hemithorax. The lungs are hypoinflated with minimal bibasilar opacities compatible with atelectasis.  No edema, sizable pleural effusion, or pneumothorax is identified. No acute osseous abnormality is seen. IMPRESSION: Minimal bibasilar atelectasis. Electronically Signed   By: Logan Bores M.D.   On: 08/11/2021 20:40   CT HEAD CODE STROKE WO CONTRAST  Result Date: 08/11/2021 CLINICAL DATA:  Code stroke.  Neuro deficit, acute, stroke suspected EXAM: CT HEAD WITHOUT CONTRAST TECHNIQUE: Contiguous axial images were obtained from the base of the skull through the vertex without intravenous contrast. RADIATION DOSE REDUCTION: This exam was performed according to the departmental dose-optimization program which includes automated exposure control, adjustment of the mA and/or kV according to patient size and/or use of iterative reconstruction technique. COMPARISON:  07/18/2021. FINDINGS: Brain: Remote right frontal parietal infarct. Slightly increased hypoattenuation in the right thalamus. Otherwise, no evidence of acute infarct by CT. Additional advanced patchy and confluent white matter hypoattenuation, nonspecific but compatible chronic microvascular ischemic disease. No mass lesion, midline shift, hydrocephalus, or acute hemorrhage. Vascular: No hyperdense vessel identified. Calcific intracranial atherosclerosis. Skull: No acute fracture. Sinuses/Orbits: Clear sinuses.  Unremarkable orbits. Other: No mastoid effusions. IMPRESSION: 1. Slightly increased hypoattenuation in the right thalamus. This could represent the sequela of advanced chronic microvascular ischemic disease, but acute infarct is difficult to exclude. An MRI could further evaluate if clinically indicated. 2. No acute hemorrhage. 3. Remote right frontoparietal infarct and similar advanced chronic microvascular ischemic disease. An MRI could provide more sensitive evaluation for acute  infarct. Findings discussed with Dr. Roderic Palau via telephone at 11:42 a.m. Electronically Signed   By: Margaretha Sheffield M.D.   On: 08/11/2021 11:46    PHYSICAL EXAM     Physical Exam  Constitutional: Frail elderly lady not in distress. Cardiovascular: Normal rate and regular rhythm.  Respiratory: Effort normal, non-labored breathing  Neuro: Mental Status: Patient is lethargic, grimaces to noxious stimuli, does not follow commands, currently nonverbal.  Will barely try to open eyes to noxious stimuli.  Positive palmomental and proximal reflexes bilaterally. Cranial Nerves: II: Visual Fields does not blink to threat on either side.. Pupils are equal, round, and reactive to light.   III,IV, VI: Left gaze palsy, does not blink to visual threat V: Facial sensation is symmetric VII: Facial movement show left nasolabial fold asymmetry. VIII: No reaction to verbal stimuli X: Positive cough and gag XI: Head is midline XII: Does not protrude tongue Motor: Tone is normal. Bulk is normal.  Left upper extremity falls faster to the bed but does have some resistance to gravity.  She is moving all extremities spontaneously and withdraws to pain in all extremities Sensory: Sensation grossly intact in all extremities Cerebellar: Unable to complete   ASSESSMENT/PLAN Ms. Julia [name] is a 88 y.o. female with history of hypertension, diabetes, Alzheimer disease, stroke (08/2020) and TIA (07/2021) presenting after being found unresponsive, nonverbal, with a left gaze by staff at her nursing home. She was given TNKase at [facility], ED and had a HT. TNKase reversed with TXA and cryo.   Stroke: Likely right thalamic infarct now with hemorrhagic transformation after TNK administration likely secondary due small vessel disease Code Stroke- Slightly increased hypoattenuation in the right thalamus. Remote right frontoparietal infarct and similar advanced chronic microvascular ischemic disease Post TNKase CT 2/10  @ 1458 - Interval acute hemorrhage within the RIGHT thalamus extending into internal capsule measuring 2.7 x 1.9 x 1.6 cm. Repeat Head CT 2/10 @ 2100- No significant interval change in size of right thalamic intraparenchymal hemorrhage, Minimal localized edema without significant regional mass effect. No intraventricular  extension or other complicating features. CTA head & neck- Multifocal severe stenosis of the right vertebral artery in the neck with occlusion at its dural margin and largely occluded intradurally. Severe distal A3/A4 ACA stenosis bilaterally with intermittent/poor distal opacification. Suspected severe bilateral M2 MCA stenosis, although evaluation is limited by motion. Moderate to severe left P2 PCA stenosis. Small (1-2 mm) outpouching at the right proximal basilar artery MRI- Bland appearing right thalamic hematoma. Remote parasagittal right parietal cortex infarct. Small remote right cerebellar infarct. remote hypertensive pattern hemorrhages. 2D Echo LVEF 60-65% Direct LDL-64.9 HgbA1c 5.4 VTE prophylaxis - SCDs aspirin 81 mg daily and clopidogrel 75 mg daily prior to admission, now on No antithrombotic.  Therapy recommendations:  SNF Disposition: Pending  Hemorrhagic transformation Hematoma stabilized. No significant mass effect  Hypertension Home meds: Norvasc, lisinopril Permissive hypertension (OK if < 220/120) but gradually normalize in 5-7 days Long-term BP goal normotensive Cleviprex 8mg   Hyperlipidemia Home meds: Pravastatin LDL 64.9, goal < 70 Resume when medically appropriate   Other Stroke Risk Factors Advanced Age >/= 46  Obesity, Body mass index is 25.39 kg/m., BMI >/= 30 associated with increased stroke risk, recommend weight loss, diet and exercise as appropriate  Hx stroke/TIA In 07/2021 patient had episode of "left facial droop, left sided weakness with slurry speech. She was sent to ED by EMS. Symptoms were resolved on arrival, no tPA given. BP  180s, and CT head showed age--indeterminate posterior right ACA territory infarct involving the parasagittal right frontoparietal region. However, this is new comparing with her 10/2020 CT head." MRI  No acute infarct. Remote right parietal cortical infarct that is new compared to 2020.  EF 60 to 65%.  EEG no seizure.  LDL 93, A1c 5.7.  Consider TIA episode, she was discharged on aspirin and Plavix DAPT for 3 weeks and then Plavix alone, as well as pravastatin 20.  Other Active Problems Elevated triglycerides-424 Currently n.p.o. due to mental status Consider corerak if mental status does not improve  Hospital day # 1  Patient seen and examined by NP/APP with MD. MD to update note as needed.   Janine Ores, DNP, FNP-BC [facility] Pager: [phone]  STROKE MD NOTE : I have personally obtained history,examined this patient, reviewed notes, independently viewed imaging studies, participated in medical decision making and plan of care.ROS completed by me personally and pertinent positives fully documented  I have made any additions or clarifications directly to the above note. Agree with note above.  Patient presented with sudden onset of altered mental status with left gaze deviation likely due to right hemispheric infarct and received IV TNK but developed neurological worsening with CT scan showing right thalamic hematoma and received prompt TXA and cryo for reversible and follow-up CT scan and MRI show stable appearance of the hematoma.  Continue close neurological observation and strict blood pressure control with systolic goal [phone]) in 4 hours and then below 160.  Long discussion with the  patient's son at the bedside about her guarded prognosis and he is quite realistic agrees to DNR/DNI but is clear that patient would not want artificial feeding and may be willing to consider comfort care measures if patient does not improve with significantly over the next few days This patient  is critically ill and at significant risk of neurological worsening, death and care requires constant monitoring of vital signs, hemodynamics,respiratory and cardiac monitoring, extensive review of multiple databases, frequent neurological assessment, discussion with family, other specialists and medical decision making of  high complexity.I have made any additions or clarifications directly to the above note.This critical care time does not reflect procedure time, or teaching time or supervisory time of PA/NP/Med Resident etc but could involve care discussion time.  I spent 35 minutes of neurocritical care time  in the care of  this patient.     Antony Contras, MD Medical Director [facility] Pager: [phone] 08/12/2021 4:39 PM  To contact Stroke Continuity provider, please refer to [email or web address] After hours, contact General Neurology

## 2021-08-12 NOTE — Progress Notes (Signed)
SLP Cancellation Note  Patient Details Name: Julia [name] MRN: [id number] DOB: 02/06/1928   Cancelled treatment:        Pt unable to wake with cold cloth and sternal rub and has not been responsive per RN. Tim, her son, at bedside and reported baseline pt in conversant and pleasantly confused with her dementia but interactive and social with staff at memory care facility. Will continue attempts.   Houston Siren 08/12/2021, 10:19 AM Orbie Pyo Colvin Caroli.Ed Risk analyst [phone] Office [phone]

## 2021-08-13 ENCOUNTER — Inpatient Hospital Stay (HOSPITAL_COMMUNITY): Payer: Medicare Other

## 2021-08-13 LAB — CBC
HCT: 28.3 % — ABNORMAL LOW (ref 36.0–46.0)
Hemoglobin: 9.2 g/dL — ABNORMAL LOW (ref 12.0–15.0)
MCH: 28 pg (ref 26.0–34.0)
MCHC: 32.5 g/dL (ref 30.0–36.0)
MCV: 86 fL (ref 80.0–100.0)
Platelets: 158 10*3/uL (ref 150–400)
RBC: 3.29 MIL/uL — ABNORMAL LOW (ref 3.87–5.11)
RDW: 13.5 % (ref 11.5–15.5)
WBC: 5.6 10*3/uL (ref 4.0–10.5)
nRBC: 0 % (ref 0.0–0.2)

## 2021-08-13 LAB — BASIC METABOLIC PANEL
Anion gap: 10 (ref 5–15)
BUN: 14 mg/dL (ref 8–23)
CO2: 21 mmol/L — ABNORMAL LOW (ref 22–32)
Calcium: 8.8 mg/dL — ABNORMAL LOW (ref 8.9–10.3)
Chloride: 111 mmol/L (ref 98–111)
Creatinine, Ser: 1 mg/dL (ref 0.44–1.00)
GFR, Estimated: 53 mL/min — ABNORMAL LOW (ref >60)
Glucose, Bld: 96 mg/dL (ref 70–99)
Potassium: 3.9 mmol/L (ref 3.5–5.1)
Sodium: 142 mmol/L (ref 135–145)

## 2021-08-13 LAB — BPAM CRYOPRECIPITATE
Blood Product Expiration Date: 202302102150
ISSUE DATE / TIME: 202302101602
Unit Type and Rh: 5100

## 2021-08-13 LAB — PREPARE CRYOPRECIPITATE: Unit division: 0

## 2021-08-13 LAB — TRIGLYCERIDES: Triglycerides: 105 mg/dL (ref <150)

## 2021-08-13 MED ORDER — HYDRALAZINE HCL 20 MG/ML IJ SOLN
20.0000 mg | Freq: Four times a day (QID) | INTRAMUSCULAR | Status: DC | PRN
  Administered 2021-08-13 – 2021-08-18 (×4): 20 mg via INTRAVENOUS
  Filled 2021-08-13 (×3): qty 1

## 2021-08-13 MED ORDER — LISINOPRIL 20 MG PO TABS
40.0000 mg | ORAL_TABLET | Freq: Every day | ORAL | Status: DC
  Administered 2021-08-13 – 2021-08-17 (×4): 40 mg via ORAL
  Filled 2021-08-13 (×4): qty 2

## 2021-08-13 MED ORDER — HYDRALAZINE HCL 20 MG/ML IJ SOLN
INTRAMUSCULAR | Status: AC
  Filled 2021-08-13: qty 1

## 2021-08-13 MED ORDER — LABETALOL HCL 5 MG/ML IV SOLN
10.0000 mg | INTRAVENOUS | Status: DC | PRN
  Administered 2021-08-13 – 2021-08-14 (×7): 10 mg via INTRAVENOUS
  Filled 2021-08-13 (×5): qty 4

## 2021-08-13 MED ORDER — AMLODIPINE BESYLATE 2.5 MG PO TABS
2.5000 mg | ORAL_TABLET | Freq: Every day | ORAL | Status: DC
  Administered 2021-08-13 – 2021-08-16 (×3): 2.5 mg via ORAL
  Filled 2021-08-13 (×3): qty 1

## 2021-08-13 NOTE — Progress Notes (Addendum)
STROKE TEAM PROGRESS NOTE   INTERVAL HISTORY Her son is at the bedside.  Neurological exam is improved.  Vital signs stable.  Blood pressure adequately controlled. Patient is awake and alert today, still not following commands, able to track and hold extremities. Patient to have swallow study completed. SLP recommend barium swallow study today.  Vitals:   08/13/21 0500 08/13/21 0600 08/13/21 0700 08/13/21 0800  BP: 140/81 (!) 111/50 (!) 140/59   Pulse: 67 70 75   Resp: 17 14 15    Temp:    97.8 F (36.6 C)  TempSrc:    Oral  SpO2: 97% 96% 99%   Weight:      Height:       CBC:  Recent Labs  Lab 08/11/21 1157 08/12/21 0201 08/13/21 0504  WBC 9.3 11.0* 5.6  NEUTROABS 7.1  --   --   HGB 11.4* 10.7* 9.2*  HCT 35.5* 31.6* 28.3*  MCV 88.8 86.8 86.0  PLT 236 255 124    Basic Metabolic Panel:  Recent Labs  Lab 08/12/21 0201 08/13/21 0504  NA 141 142  K 4.6 3.9  CL 108 111  CO2 19* 21*  GLUCOSE 120* 96  BUN 18 14  CREATININE 1.25* 1.00  CALCIUM 9.1 8.8*    Lipid Panel:  Recent Labs  Lab 08/12/21 0201  CHOL 115  TRIG 424*  HDL 20*  CHOLHDL 5.8  VLDL UNABLE TO CALCULATE IF TRIGLYCERIDE OVER 400 mg/dL  LDLCALC UNABLE TO CALCULATE IF TRIGLYCERIDE OVER 400 mg/dL    HgbA1c:  Recent Labs  Lab 08/12/21 0201  HGBA1C 5.4    Urine Drug Screen: No results for input(s): LABOPIA, COCAINSCRNUR, LABBENZ, AMPHETMU, THCU, LABBARB in the last 168 hours.  Alcohol Level  Recent Labs  Lab 08/11/21 [place] <10     IMAGING past 24 hours No results found.  PHYSICAL EXAM     Physical Exam  Constitutional: Frail elderly lady not in distress. Cardiovascular: Normal rate and regular rhythm.  Respiratory: Effort normal, non-labored breathing  Neuro: Mental Status: Patient is awake  and alert, does not follow commands, some appropriate verbal output.      Cranial Nerves: II: Visual Fields does not blink to threat on either side.. Pupils are equal, round, and  reactive to light.   III,IV, VI: Left gaze palsy, does come to midline, does not blink to visual threat V: Facial sensation is symmetric VII: Facial movement show left nasolabial fold asymmetry. VIII: No reaction to verbal stimuli X: Positive cough and gag XI: Head is midline XII: Does not protrude tongue Motor: Tone is normal. Bulk is normal.  Left upper extremity is able to lift off the bed but there is weaker than the right with rest.  Mild left lower extremity weakness as well.  She is moving all extremities spontaneously and withdraws to pain in all extremities Sensory: Sensation grossly intact in all extremities Cerebellar: Unable to complete   ASSESSMENT/PLAN Julia [name] is a 88 y.o. female with history of hypertension, diabetes, Alzheimer disease, stroke (08/2020) and TIA (07/2021) presenting after being found unresponsive, nonverbal, with a left gaze by staff at her nursing home. She was given TNKase at [facility], ED and had a HT. TNKase reversed with TXA and cryo. Barium swallow today.   Stroke: Likely right thalamic infarct now with hemorrhagic transformation after TNK administration likely secondary due small vessel disease Code Stroke- Slightly increased hypoattenuation in the right thalamus. Remote right frontoparietal infarct and similar advanced chronic  microvascular ischemic disease Post TNKase CT 2/10 @ 1458 - Interval acute hemorrhage within the RIGHT thalamus extending into internal capsule measuring 2.7 x 1.9 x 1.6 cm. Repeat Head CT 2/10 @ 2100- No significant interval change in size of right thalamic intraparenchymal hemorrhage, Minimal localized edema without significant regional mass effect. No intraventricular extension or other complicating features. CTA head & neck- Multifocal severe stenosis of the right vertebral artery in the neck with occlusion at its dural margin and largely occluded intradurally. Severe distal A3/A4 ACA stenosis bilaterally with  intermittent/poor distal opacification. Suspected severe bilateral M2 MCA stenosis, although evaluation is limited by motion. Moderate to severe left P2 PCA stenosis. Small (1-2 mm) outpouching at the right proximal basilar artery MRI- Bland appearing right thalamic hematoma. Remote parasagittal right parietal cortex infarct. Small remote right cerebellar infarct. remote hypertensive pattern hemorrhages. 2D Echo LVEF 60-65% Direct LDL-64.9 HgbA1c 5.4 VTE prophylaxis - SCDs aspirin 81 mg daily and clopidogrel 75 mg daily prior to admission, now on No antithrombotic.  Therapy recommendations:  SNF Disposition: Pending  Hemorrhagic transformation Hematoma stabilized. No significant mass effect  Hypertension Home meds: Norvasc, lisinopril BP goal less than 160 Long-term BP goal normotensive Cleviprex 8mg - titrate off Prn labetalol and hydralazine for BP control  Hyperlipidemia Home meds: Pravastatin LDL 64.9, goal < 70 Resume when medically appropriate   Other Stroke Risk Factors Advanced Age >/= 75  Obesity, Body mass index is 25.39 kg/m., BMI >/= 30 associated with increased stroke risk, recommend weight loss, diet and exercise as appropriate  Hx stroke/TIA In 07/2021 patient had episode of "left facial droop, left sided weakness with slurry speech. She was sent to ED by EMS. Symptoms were resolved on arrival, no tPA given. BP 180s, and CT head showed age--indeterminate posterior right ACA territory infarct involving the parasagittal right frontoparietal region. However, this is new comparing with her 10/2020 CT head." MRI  No acute infarct. Remote right parietal cortical infarct that is new compared to 2020.  EF 60 to 65%.  EEG no seizure.  LDL 93, A1c 5.7.  Consider TIA episode, she was discharged on aspirin and Plavix DAPT for 3 weeks and then Plavix alone, as well as pravastatin 20.  Other Active Problems Elevated triglycerides-424 Dysphagia SLP eval- recommend barium swallow  study Restart home meds when able  Hospital day # 2  Patient seen and examined by NP/APP with MD. MD to update note as needed.   Janine Ores, DNP, FNP-BC [facility] Pager: [phone]  I have personally obtained history,examined this patient, reviewed notes, independently viewed imaging studies, participated in medical decision making and plan of care.ROS completed by me personally and pertinent positives fully documented  I have made any additions or clarifications directly to the above note. Agree with note above.  Continue close neurological observation and strict blood pressure control with systolic less than 654.  Mobilize out of bed.  Therapy consults.  Rehab consult.  Transfer to neurology floor bed and consult medical hospitalist for pickup when on the floor.  Long discussion with patient and son at the bedside and answered questions.This patient is critically ill and at significant risk of neurological worsening, death and care requires constant monitoring of vital signs, hemodynamics,respiratory and cardiac monitoring, extensive review of multiple databases, frequent neurological assessment, discussion with family, other specialists and medical decision making of high complexity.I have made any additions or clarifications directly to the above note.This critical care time does not reflect procedure time, or teaching time or  supervisory time of PA/NP/Med Resident etc but could involve care discussion time.  I spent 30 minutes of neurocritical care time  in the care of  this patient.      Antony Contras, MD Medical Director [facility] Pager: [phone] 08/13/2021 3:19 PM  To contact Stroke Continuity provider, please refer to [email or web address] After hours, contact General Neurology

## 2021-08-13 NOTE — Evaluation (Signed)
Clinical/Bedside Swallow Evaluation Patient Details  Name: Julia [name] MRN: [id number] Date of Birth: [birth year]  Today's Date: 08/13/2021 Time: SLP Start Time (ACUTE ONLY): 1342 SLP Stop Time (ACUTE ONLY): 1400 SLP Time Calculation (min) (ACUTE ONLY): 18 min  Past Medical History:  Past Medical History:  Diagnosis Date   Alzheimer disease ([place])    Anxiety    CVA (cerebral vascular accident) ([place])    Hypertension    Past Surgical History: History reviewed. No pertinent surgical history. HPI:  Julia [name] is a 88 y.o. female with history of hypertension, diabetes, Alzheimer disease, stroke (08/2020) and TIA (07/2021) presenting after being found unresponsive, nonverbal, with a left gaze by staff at her nursing home. She was given TNKase at [facility], ED and had a HT. TNKase reversed with TXA and cryo. MRI 2/11: "Bland appearing right thalamic hematoma. There is a background of advanced chronic small vessel disease and remote hypertensive  pattern hemorrhages"    Assessment / Plan / Recommendation  Clinical Impression  Pt presents with clinical indicators of pharyngeal dysphagia.  Pt was unable to participate in OME and was initiatlly reluctant to accept bolus trials.  R assymetry noted at baseline and decreased R facial ROM with spontaneous speec and smiling.  Pt would not accept ice chips.  There was intermittent tongue thrusting with bolus presentation.  Pt required multiple swallows with both thin and nectar thick liquids. There was intermittent throat clear noted with these consistencies and wet vocal quality as well.  Pt exhibited good oral clearance of puree and no overt s/s of aspiration, but only accepted small amounts.  With regular solid cracker, pt required encouragement and verbal cuing to masticate.  There was mild oral reside L>R.    Recommend MBSS prior to initiation of PO diet.  Will plan for instrumental swallow evaluation later this date.   SLP Visit Diagnosis:  Dysphagia, unspecified (R13.10)    Aspiration Risk  Mild aspiration risk    Diet Recommendation NPO   Medication Administration: Via alternative means    Other  Recommendations Oral Care Recommendations: Oral care QID    Recommendations for follow up therapy are one component of a multi-disciplinary discharge planning process, led by the attending physician.  Recommendations may be updated based on patient status, additional functional criteria and insurance authorization.  Follow up Recommendations  (TBD)      Assistance Recommended at Discharge  (TBD)  Functional Status Assessment Patient has had a recent decline in their functional status and demonstrates the ability to make significant improvements in function in a reasonable and predictable amount of time.  Frequency and Duration  (TBD)          Prognosis Prognosis for Safe Diet Advancement:  (TBD)      Swallow Study   General HPI: Julia [name] is a 88 y.o. female with history of hypertension, diabetes, Alzheimer disease, stroke (08/2020) and TIA (07/2021) presenting after being found unresponsive, nonverbal, with a left gaze by staff at her nursing home. She was given TNKase at [facility], ED and had a HT. TNKase reversed with TXA and cryo. MRI 2/11: "Bland appearing right thalamic hematoma. There is a background of advanced chronic small vessel disease and remote hypertensive  pattern hemorrhages" Type of Study: Bedside Swallow Evaluation Previous Swallow Assessment: None Diet Prior to this Study: NPO Temperature Spikes Noted: No Respiratory Status: Room air History of Recent Intubation: No Behavior/Cognition: Alert;Confused;Requires cueing Oral Cavity Assessment: Within Functional Limits Oral Care Completed by  SLP: No Oral Cavity - Dentition: Adequate natural dentition Self-Feeding Abilities: Needs assist Patient Positioning: Upright in bed Baseline Vocal Quality: Normal Volitional Cough: Cognitively unable to  elicit Volitional Swallow: Unable to elicit    Oral/Motor/Sensory Function Overall Oral Motor/Sensory Function: Within functional limits Facial ROM: Reduced left Facial Symmetry: Abnormal symmetry left Lingual ROM:  (Could not assess) Lingual Symmetry:  (Could not assess) Lingual Strength:  (Could not assess) Velum:  (Could not assess) Mandible:  (Could not assess)   Ice Chips Ice chips: Impaired Oral Phase Functional Implications:  (No oral acceptance)   Thin Liquid Thin Liquid: Impaired Presentation: Straw Pharyngeal  Phase Impairments: Throat Clearing - Immediate;Multiple swallows;Suspected delayed Swallow;Wet Vocal Quality    Nectar Thick Nectar Thick Liquid: Impaired Presentation: Cup;Straw Pharyngeal Phase Impairments: Throat Clearing - Immediate;Multiple swallows;Suspected delayed Swallow   Honey Thick Honey Thick Liquid: Not tested   Puree Puree: Impaired Oral Phase Impairments: Poor awareness of bolus Oral Phase Functional Implications: Prolonged oral transit;Oral holding   Solid     Solid: Impaired Oral Phase Impairments: Poor awareness of bolus;Impaired mastication      Julia [name] , [name], [name] Office: [phone]  08/13/2021,2:27 PM

## 2021-08-13 NOTE — Plan of Care (Signed)
°  Problem: Education: Goal: Knowledge of disease or condition will improve Outcome: Progressing   Problem: Self-Care: Goal: Ability to participate in self-care as condition permits will improve Outcome: Progressing   Problem: Intracerebral Hemorrhage Tissue Perfusion: Goal: Complications of Intracerebral Hemorrhage will be minimized Outcome: Progressing

## 2021-08-13 NOTE — Progress Notes (Signed)
Modified Barium Swallow Progress Note  Patient Details  Name: Julia [name] MRN: [id number] Date of Birth: [birth year]  Today's Date: 08/13/2021  Modified Barium Swallow completed.  Full report located under Chart Review in the Imaging Section.  Brief recommendations include the following:  Clinical Impression  Pt presents with a moderate oral dysphagia and a mild pharyngeal dysphagia which is at least in part related to cognitive impairments.  Pt with very poor acceptance of bolus trials today.  There was significant anterior loss of bolus trials, but this appeared to be related to cognition and not to a labial seal insufficiency.  There was pocketing and oral holding. Lingual discoordination and flutter.  There was incomplete lingual contact to palate and oral residue. There was delayed swallow initiation to pyriform sinuses with both thin and nectar thick liquids.  This resulted in penetration of thin liquid prior to the swallow, which fully cleared during swallow.  There was no penetration with honey thick, puree, or solid consistencies.  Pt may not be able to meet nutritional needs orally 2/2 cognitive changes.    Recommend mechanical soft diet with thin liquid by cup. Administer medications crushed in puree, if permissible.   Swallow Evaluation Recommendations       SLP Diet Recommendations: Dysphagia 3 (Mech soft) solids;Thin liquid   Liquid Administration via: Cup;No straw   Medication Administration: Crushed with puree   Supervision: Full assist for feeding   Compensations: Slow rate;Small sips/bites   Postural Changes: Seated upright at 90 degrees   Oral Care Recommendations: Oral care BID        Celedonio Savage, [place], [place] Office: [phone]  08/13/2021,4:07 PM

## 2021-08-13 NOTE — Progress Notes (Signed)
PRN labetalol given for two consecutive out of range BP measurements with BP still increasing. Neurology notified of labetalol not working. New order given for PRN hydralazine. PRN hydralazine given at 1312.    08/13/21 1200 08/13/21 1230 08/13/21 1300  Vitals  BP (!) 167/56 (PRN labetalol given) (!) 175/54 (PRN labetalol given) (!) 176/56 (PRN hydralazine given 1312)

## 2021-08-14 LAB — CBC
HCT: 25.4 % — ABNORMAL LOW (ref 36.0–46.0)
Hemoglobin: 8.5 g/dL — ABNORMAL LOW (ref 12.0–15.0)
MCH: 28.5 pg (ref 26.0–34.0)
MCHC: 33.5 g/dL (ref 30.0–36.0)
MCV: 85.2 fL (ref 80.0–100.0)
Platelets: 183 10*3/uL (ref 150–400)
RBC: 2.98 MIL/uL — ABNORMAL LOW (ref 3.87–5.11)
RDW: 13.4 % (ref 11.5–15.5)
WBC: 5.3 10*3/uL (ref 4.0–10.5)
nRBC: 0 % (ref 0.0–0.2)

## 2021-08-14 LAB — BASIC METABOLIC PANEL
Anion gap: 9 (ref 5–15)
BUN: 13 mg/dL (ref 8–23)
CO2: 21 mmol/L — ABNORMAL LOW (ref 22–32)
Calcium: 8.7 mg/dL — ABNORMAL LOW (ref 8.9–10.3)
Chloride: 110 mmol/L (ref 98–111)
Creatinine, Ser: 1.08 mg/dL — ABNORMAL HIGH (ref 0.44–1.00)
GFR, Estimated: 48 mL/min — ABNORMAL LOW (ref >60)
Glucose, Bld: 90 mg/dL (ref 70–99)
Potassium: 3.6 mmol/L (ref 3.5–5.1)
Sodium: 140 mmol/L (ref 135–145)

## 2021-08-14 LAB — GLUCOSE, CAPILLARY: Glucose-Capillary: 112 mg/dL — ABNORMAL HIGH (ref 70–99)

## 2021-08-14 MED ORDER — PANTOPRAZOLE SODIUM 40 MG PO TBEC
40.0000 mg | DELAYED_RELEASE_TABLET | Freq: Every day | ORAL | Status: DC
  Administered 2021-08-14 – 2021-08-17 (×4): 40 mg via ORAL
  Filled 2021-08-14 (×4): qty 1

## 2021-08-14 MED ORDER — INSULIN ASPART 100 UNIT/ML IJ SOLN
0.0000 [IU] | INTRAMUSCULAR | Status: DC
  Administered 2021-08-15: 2 [IU] via SUBCUTANEOUS
  Administered 2021-08-15 – 2021-08-18 (×7): 1 [IU] via SUBCUTANEOUS

## 2021-08-14 NOTE — Progress Notes (Signed)
CSW continuing to follow patient's ability to return to [facility] ALF.   Gilmore Laroche, MSW, [facility]

## 2021-08-14 NOTE — NC FL2 (Signed)
[place] MEDICAID FL2 LEVEL OF CARE SCREENING TOOL     IDENTIFICATION  Patient Name: Julia [name] Birthdate: 1927/10/13 Sex: female Admission Date (Current Location): 08/11/2021  [facility] and [place] Number:  [employer] and Address:  [facility] [place] [address] [place], [place] [address]      Provider Number: [id number]  Attending Physician Name and Address:  Geradine Girt, DO  Relative Name and Phone Number:       Current Level of Care: Hospital Recommended Level of Care: [place] Prior Approval Number:    Date Approved/Denied:   PASRR Number: [id number]  Discharge Plan: SNF    Current Diagnoses: Patient Active Problem List   Diagnosis Date Noted   Stroke (cerebrum) ([place]) 08/11/2021   TIA (transient ischemic attack) 07/20/2021   Acute lower UTI 07/20/2021   Hyperglycemia due to diabetes mellitus ([place]) 07/19/2021   Essential hypertension 07/19/2021   Alzheimer's disease ([place]) 07/19/2021    Orientation RESPIRATION BLADDER Height & Weight     Self  Normal Incontinent, External catheter Weight: 157 lb 4.8 oz (71.4 kg) Height:  5\' 6"  (167.6 cm)  BEHAVIORAL SYMPTOMS/MOOD NEUROLOGICAL BOWEL NUTRITION STATUS      Incontinent Diet (see dc summary)  AMBULATORY STATUS COMMUNICATION OF NEEDS Skin   Extensive Assist Verbally Normal                       Personal Care Assistance Level of Assistance  Bathing, Feeding, Dressing Bathing Assistance: Maximum assistance Feeding assistance: Maximum assistance Dressing Assistance: Maximum assistance     Functional Limitations Info             SPECIAL CARE FACTORS FREQUENCY  PT (By licensed PT), OT (By licensed OT)     PT Frequency: 5x/week OT Frequency: 5x/week            Contractures Contractures Info: Not present    Additional Factors Info  Code Status, Allergies Code Status Info: DNR Allergies Info: Sulfa Antibiotics           Current  Medications (08/14/2021):  This is the current hospital active medication list Current Facility-Administered Medications  Medication Dose Route Frequency Provider Last Rate Last Admin    stroke: mapping our early stages of recovery book   Does not apply Once Rosalin Hawking, MD       0.9 %  sodium chloride infusion (Manually program via Guardrails IV Fluids)   Intravenous Once Rosalin Hawking, MD       acetaminophen (TYLENOL) tablet 650 mg  650 mg Oral Q4H PRN Rosalin Hawking, MD       Or   acetaminophen (TYLENOL) 160 MG/5ML solution 650 mg  650 mg Per Tube Q4H PRN Rosalin Hawking, MD       Or   acetaminophen (TYLENOL) suppository 650 mg  650 mg Rectal Q4H PRN Rosalin Hawking, MD       amLODipine (NORVASC) tablet 2.5 mg  2.5 mg Oral Daily Charlean Merl, Marcelino Scot, NP   2.5 mg at 08/14/21 1043   chlorhexidine (PERIDEX) 0.12 % solution 15 mL  15 mL Mouth Rinse BID Garvin Fila, MD   15 mL at 08/14/21 1043   Chlorhexidine Gluconate Cloth 2 % PADS 6 each  6 each Topical Daily Kerney Elbe, MD   6 each at 08/14/21 1048   hydrALAZINE (APRESOLINE) injection 20 mg  20 mg Intravenous Q6H PRN Janine Ores, NP   20 mg at 08/13/21 1312  labetalol (NORMODYNE) injection 10 mg  10 mg Intravenous Q10 min PRN Janine Ores, NP   10 mg at 08/14/21 0644   lisinopril (ZESTRIL) tablet 40 mg  40 mg Oral Daily Janine Ores, NP   40 mg at 08/14/21 1043   MEDLINE mouth rinse  15 mL Mouth Rinse q12n4p Garvin Fila, MD   15 mL at 08/13/21 1646   mupirocin ointment (BACTROBAN) 2 % 1 application  1 application Nasal BID Kerney Elbe, MD   1 application at 28/63/81 1048   pantoprazole (PROTONIX) injection 40 mg  40 mg Intravenous QHS Rosalin Hawking, MD   40 mg at 08/13/21 2127   senna-docusate (Senokot-S) tablet 1 tablet  1 tablet Oral QHS PRN Rosalin Hawking, MD         Discharge Medications: Please see discharge summary for a list of discharge medications.  Relevant Imaging Results:  Relevant Lab Results:   Additional Information SS#  [id number]. Moderna vaccines 09/13/20, 10/14/20  Benard Halsted, LCSW

## 2021-08-14 NOTE — Progress Notes (Signed)
Pt has been too lethargic for oral intake, despite multiple attempts with feeding. She has eaten less than 1/2 cup of fruit and a few sips of water today only. MD notified.  Family is concerned about her poor intake.

## 2021-08-14 NOTE — Assessment & Plan Note (Addendum)
-  from a memory care unit ([facility] in [place])

## 2021-08-14 NOTE — Evaluation (Signed)
Speech Language Pathology Evaluation Patient Details Name: Julia [name] MRN: [id number] DOB: Feb 07, 1928 Today's Date: 08/14/2021 Time: 4098-1191 SLP Time Calculation (min) (ACUTE ONLY): 12 min  Problem List:  Patient Active Problem List   Diagnosis Date Noted   Stroke (cerebrum) ([place]) 08/11/2021   TIA (transient ischemic attack) 07/20/2021   Acute lower UTI 07/20/2021   Hyperglycemia due to diabetes mellitus ([place]) 07/19/2021   Essential hypertension 07/19/2021   Alzheimer's disease ([place]) 07/19/2021   Past Medical History:  Past Medical History:  Diagnosis Date   Alzheimer disease ([place])    Anxiety    CVA (cerebral vascular accident) ([place])    Hypertension    Past Surgical History: History reviewed. No pertinent surgical history. HPI:  Julia [name] is a 88 y.o. female with history of hypertension, diabetes, Alzheimer disease, stroke (08/2020) and TIA (07/2021) presenting after being found unresponsive, nonverbal, with a left gaze by staff at her nursing home. She was given TNKase at [facility], ED and had a HT. TNKase reversed with TXA and cryo. MRI 2/11: "Bland appearing right thalamic hematoma. There is a background of advanced chronic small vessel disease and remote hypertensive  pattern hemorrhages"   Assessment / Plan / Recommendation Clinical Impression  Pt was seen for speech-language evaluation with her son present. Pt's son reported that pt currently resides on a memory care unit, and has baseline deficits in cognition, but he denied any difficulty with linguistic fluency. Per the pt's son, pt's language skills were ~70% back to baseline when he saw her yesterday. Pt was lethargic throughout the evaluation and the impact of this is strongly considered. Pt presented with aphasia characterized by impairments in receptive and expressive language. She produced four single-word utterances and inconsistently followed some 1-step commands. However, difficulty was noted beyond these  levels possibly due to lethargy and/or linguistic impairments. Continued skilled SLP services are clinically indicated at this time for further evaluation and treatment.    SLP Assessment  SLP Recommendation/Assessment: Patient needs continued Speech Lanaguage Pathology Services SLP Visit Diagnosis: Aphasia (R47.01);Cognitive communication deficit (R41.841)    Recommendations for follow up therapy are one component of a multi-disciplinary discharge planning process, led by the attending physician.  Recommendations may be updated based on patient status, additional functional criteria and insurance authorization.    Follow Up Recommendations  Skilled nursing-short term rehab (<3 hours/day)    Assistance Recommended at Discharge  Frequent or constant Supervision/Assistance  Functional Status Assessment Patient has had a recent decline in their functional status and demonstrates the ability to make significant improvements in function in a reasonable and predictable amount of time.  Frequency and Duration min 2x/week  2 weeks      SLP Evaluation Cognition  Overall Cognitive Status: History of cognitive impairments - at baseline Arousal/Alertness: Lethargic Orientation Level: Oriented to person       Comprehension  Auditory Comprehension Overall Auditory Comprehension: Impaired Yes/No Questions: Impaired Basic Immediate Environment Questions:  (2/5) Commands: Impaired One Step Basic Commands:  (1/4 with tactile cues)    Expression Expression Primary Mode of Expression: Verbal Verbal Expression Overall Verbal Expression: Impaired Initiation: Impaired Automatic Speech: Name (first name given)   Oral / Motor  Motor Speech Overall Motor Speech: Appears within functional limits for tasks assessed (though output limited) Respiration: Within functional limits Phonation: Normal Articulation: Within functional limitis           Jodeci Roarty I. Hardin Negus, [place], [place] Office number [phone] Pager [place] I  Hardin Negus 08/14/2021, 10:49 AM

## 2021-08-14 NOTE — Progress Notes (Signed)
°  Progress Note   Patient: Julia [name] BZM:[id number] DOB: 1928-05-05 DOA: 08/11/2021     3 DOS: the patient was seen and examined on 08/14/2021   Brief hospital course: Hetvi Pakistan is a 88 y.o. Caucasian 88 year old female with history of hypertension, diabetes, Alzheimer disease, stroke and TIA presented to ED for code stroke.  Slow to improve.  Currently not eating.  May need hospice if PO intake does not improve.  Assessment and Plan: * Stroke (cerebrum) ([place])- (present on admission) -Post TNKase CT 2/10 @ 1458 - Interval acute hemorrhage within the RIGHT thalamus extending into internal capsule measuring 2.7 x 1.9 x 1.6 cm. -Repeat Head CT 2/10 @ 2100- No significant interval change in size of right thalamic intraparenchymal hemorrhage, Minimal localized edema without significant regional mass effect. No intraventricular extension or other complicating features. -CTA head & neck- Multifocal severe stenosis of the right vertebral artery in the neck with occlusion at its dural margin and largely occluded intradurally. Severe distal A3/A4 ACA stenosis bilaterally with intermittent/poor distal opacification. Suspected severe bilateral M2 MCA stenosis, although evaluation is limited by motion. Moderate to severe left P2 PCA stenosis. Small (1-2 mm) outpouching at the right proximal basilar artery -MRI- Bland appearing right thalamic hematoma. Remote parasagittal right parietal cortex infarct. Small remote right cerebellar infarct. remote hypertensive pattern hemorrhages. -2D Echo LVEF 60-65% - LDL-64.9 -HgbA1c 5.4 -no antithombotic  Alzheimer's disease ([place])- (present on admission) -from a memory care unit  Essential hypertension- (present on admission) -BP goal less than 160 -PRN IV  Hyperglycemia due to diabetes mellitus ([place])- (present on admission) -SSI -not eating well so no need for long acting    Palliative care consult -from memory care -overall poor prognosis -not eating and  family would not want a feeding tube   Subjective: minimally responsive  Physical Exam: Vitals:   08/14/21 1000 08/14/21 1100 08/14/21 1200 08/14/21 1300  BP: (!) 160/50 (!) 148/49 (!) 130/38 (!) 152/52  Pulse: 67 70 63 63  Resp: 15 16 15 17   Temp:   97.9 F (36.6 C)   TempSrc:   Axillary   SpO2: 95% 96% 96% 97%  Weight:      Height:        General: Appearance:     Frail female      Lungs:     respirations unlabored  Heart:    Normal heart rate.    MS:   All extremities are intact.    Neurologic:   Does not respond to voice/stimulation     Data Reviewed: Hgb 8.5  Family Communication: at bedside  Disposition: Status is: Inpatient Remains inpatient appropriate because: needs safe d/c plan-- if not eating, will need hospice instead of SNF/memory care          Planned Discharge Destination:  tbd   Time spent: 55 minutes  Author: Geradine Girt, DO 08/14/2021 1:39 PM  For on call review [email or web address].

## 2021-08-14 NOTE — Assessment & Plan Note (Signed)
-  BP goal less than 160 -PRN IV

## 2021-08-14 NOTE — TOC CAGE-AID Note (Signed)
Transition of Care [facility]) - CAGE-AID Screening   Patient Details  Name: Julia [name] MRN: [id number] Date of Birth: 12/08/1927  [facility]) CM/SW Contact:    Consepcion Utt C Tarpley-Carter, [place] Phone Number: 08/14/2021, 1:33 PM   Clinical Narrative: Pt is unable to participate in Cage Aid. Pt is not appropriate for assessment, due to Alzheimer's diagnosis.  Muaz Shorey Tarpley-Carter, MSW, LCSW-A Pronouns:  She/Her/Hers [place] Transitions of Care Clinical Social Worker Direct Number:  [phone] [email or web address]  CAGE-AID Screening: Substance Abuse Screening unable to be completed due to: : Patient unable to participate  Have You Ever Felt You Ought to Cut Down on Your Drinking or Drug Use?: No          Substance Abuse Education Offered: No

## 2021-08-14 NOTE — Progress Notes (Addendum)
STROKE TEAM PROGRESS NOTE   INTERVAL HISTORY Patient is seen in her room with her son at the bedside.  She has been hemodynamically stable and her neurological exam is stable.  She has passed her swallow exam and is now on a dysphagia 3 diet. She appears drowsy this morning and is not cooperative with exam.  Vitals:   08/14/21 1100 08/14/21 1200 08/14/21 1300 08/14/21 1400  BP: (!) 148/49 (!) 130/38 (!) 152/52 (!) 138/47  Pulse: 70 63 63 65  Resp: 16 15 17 14   Temp:  97.9 F (36.6 C)    TempSrc:  Axillary    SpO2: 96% 96% 97% 97%  Weight:      Height:       CBC:  Recent Labs  Lab 08/11/21 1157 08/12/21 0201 08/13/21 0504 08/14/21 0552  WBC 9.3   < > 5.6 5.3  NEUTROABS 7.1  --   --   --   HGB 11.4*   < > 9.2* 8.5*  HCT 35.5*   < > 28.3* 25.4*  MCV 88.8   < > 86.0 85.2  PLT 236   < > 158 183   < > = values in this interval not displayed.    Basic Metabolic Panel:  Recent Labs  Lab 08/13/21 0504 08/14/21 0552  NA 142 140  K 3.9 3.6  CL 111 110  CO2 21* 21*  GLUCOSE 96 90  BUN 14 13  CREATININE 1.00 1.08*  CALCIUM 8.8* 8.7*    Lipid Panel:  Recent Labs  Lab 08/12/21 0201 08/13/21 0504  CHOL 115  --   TRIG 424* 105  HDL 20*  --   CHOLHDL 5.8  --   VLDL UNABLE TO CALCULATE IF TRIGLYCERIDE OVER 400 mg/dL  --   LDLCALC UNABLE TO CALCULATE IF TRIGLYCERIDE OVER 400 mg/dL  --     HgbA1c:  Recent Labs  Lab 08/12/21 0201  HGBA1C 5.4    Urine Drug Screen: No results for input(s): LABOPIA, COCAINSCRNUR, LABBENZ, AMPHETMU, THCU, LABBARB in the last 168 hours.  Alcohol Level  Recent Labs  Lab 08/11/21 [place] <10     IMAGING past 24 hours No results found.  PHYSICAL EXAM     Physical Exam  Constitutional: Frail elderly lady not in distress. Cardiovascular: Normal rate and regular rhythm.  Respiratory: Effort normal, non-labored breathing  Neuro: Mental Status: Patient is awake  and alert, does not follow commands, some appropriate verbal  output.      Cranial Nerves: II: Visual Fields does not blink to threat on either side.. Pupils are equal, round, and reactive to light.   VII: Facial movement show left nasolabial fold asymmetry. VIII: No reaction to verbal stimuli XI: Head is midline XII: Does not protrude tongue Motor:  She is moving all extremities spontaneously  Sensory: Sensation grossly intact in all extremities Cerebellar: Unable to complete   ASSESSMENT/PLAN Ms. Chitara Clonch is a 88 y.o. female with history of hypertension, diabetes, Alzheimer disease, stroke (08/2020) and TIA (07/2021) presenting after being found unresponsive, nonverbal, with a left gaze by staff at her nursing home. She was given TNKase at [facility], ED and had a HT. TNKase reversed with TXA and cryo. Barium swallow today.   Stroke: Likely right thalamic infarct now with hemorrhagic transformation after TNK administration likely secondary due small vessel disease Code Stroke- Slightly increased hypoattenuation in the right thalamus. Remote right frontoparietal infarct and similar advanced chronic microvascular ischemic disease Post TNKase CT 2/10 @  1458 - Interval acute hemorrhage within the RIGHT thalamus extending into internal capsule measuring 2.7 x 1.9 x 1.6 cm. Repeat Head CT 2/10 @ 2100- No significant interval change in size of right thalamic intraparenchymal hemorrhage, Minimal localized edema without significant regional mass effect. No intraventricular extension or other complicating features. CTA head & neck- Multifocal severe stenosis of the right vertebral artery in the neck with occlusion at its dural margin and largely occluded intradurally. Severe distal A3/A4 ACA stenosis bilaterally with intermittent/poor distal opacification. Suspected severe bilateral M2 MCA stenosis, although evaluation is limited by motion. Moderate to severe left P2 PCA stenosis. Small (1-2 mm) outpouching at the right proximal basilar artery MRI- Bland  appearing right thalamic hematoma. Remote parasagittal right parietal cortex infarct. Small remote right cerebellar infarct. remote hypertensive pattern hemorrhages. 2D Echo LVEF 60-65% Direct LDL-64.9 HgbA1c 5.4 VTE prophylaxis - SCDs aspirin 81 mg daily and clopidogrel 75 mg daily prior to admission, now on No antithrombotic.  Therapy recommendations:  SNF Disposition: Pending  Hemorrhagic transformation Hematoma stabilized. No significant mass effect  Hypertension Home meds: Norvasc, lisinopril BP goal less than 160 Long-term BP goal normotensive Cleviprex 8mg - titrate off Prn labetalol and hydralazine for BP control  Hyperlipidemia Home meds: Pravastatin LDL 64.9, goal < 70 Resume when medically appropriate   Other Stroke Risk Factors Advanced Age >/= 62  Obesity, Body mass index is 25.39 kg/m., BMI >/= 30 associated with increased stroke risk, recommend weight loss, diet and exercise as appropriate  Hx stroke/TIA In 07/2021 patient had episode of "left facial droop, left sided weakness with slurry speech. She was sent to ED by EMS. Symptoms were resolved on arrival, no tPA given. BP 180s, and CT head showed age--indeterminate posterior right ACA territory infarct involving the parasagittal right frontoparietal region. However, this is new comparing with her 10/2020 CT head." MRI  No acute infarct. Remote right parietal cortical infarct that is new compared to 2020.  EF 60 to 65%.  EEG no seizure.  LDL 93, A1c 5.7.  Consider TIA episode, she was discharged on aspirin and Plavix DAPT for 3 weeks and then Plavix alone, as well as pravastatin 20.  Other Active Problems Elevated triglycerides-424 Dysphagia SLP eval- recommend barium swallow study Restart home meds when able  Hospital day # 3  Patient seen and examined by NP/APP with MD. MD to update note as needed.   [place] , MSN, AGACNP-BC [facility] See Amion for schedule and pager  information 08/14/2021 4:13 PM   I have personally obtained history,examined this patient, reviewed notes, independently viewed imaging studies, participated in medical decision making and plan of care.ROS completed by me personally and pertinent positives fully documented  I have made any additions or clarifications directly to the above note. Agree with note above.  Patient is making slow improvement.  Encourage p.o. diet.  Mobilize out of bed.  Therapy consults.  Transfer back to skilled nursing facility when bed available.  Long discussion patient and son at the bedside and answered questions.  Greater than 50% time during this 35-minute visit was spent on counseling and coordination of care discussion with patient care team and answered questions.  Antony Contras, MD Medical Director [facility] Pager: [phone] 08/14/2021 4:44 PM   To contact Stroke Continuity provider, please refer to [email or web address] After hours, contact General Neurology

## 2021-08-14 NOTE — Progress Notes (Signed)
Speech Language Pathology Treatment: Dysphagia  Patient Details Name: Julia [name] MRN: [id number] DOB: 26-May-1928 Today's Date: 08/14/2021 Time: 4098-1191 SLP Time Calculation (min) (ACUTE ONLY): 11 min  Assessment / Plan / Recommendation Clinical Impression  Pt was seen for dysphagia treatment with her son present. She was lethargic during the session and the impact of this on her performance is considered. Pt was resistant to p.o. intake and frequently pursed her lips when boluses were presented. With encouragement and prompts from this SLP and her son, pt tolerated thin liquids via cup without overt s/sx of aspiration. Pt exhibited oral holding with purees and these were ultimately removed from the oral cavity via suction. Pt did not accept additional solids despite cues. Pt's case was discussed with her RN who denied pt's receipt of any sedating meds, but agreed that pt's lethargy is notable. It was agreed that pt's current diet will be continued, but that p.o. intake will be deferred if pt's alertness remains inadequate. SLP will continue to follow pt.     HPI HPI: Ms. Julia [name] is a 88 y.o. female with history of hypertension, diabetes, Alzheimer disease, stroke (08/2020) and TIA (07/2021) presenting after being found unresponsive, nonverbal, with a left gaze by staff at her nursing home. She was given TNKase at [facility], ED and had a HT. TNKase reversed with TXA and cryo. MRI 2/11: "Bland appearing right thalamic hematoma. There is a background of advanced chronic small vessel disease and remote hypertensive  pattern hemorrhages"      SLP Plan  Continue with current plan of care      Recommendations for follow up therapy are one component of a multi-disciplinary discharge planning process, led by the attending physician.  Recommendations may be updated based on patient status, additional functional criteria and insurance authorization.    Recommendations  Diet recommendations:  Dysphagia 3 (mechanical soft);Thin liquid Liquids provided via: Cup;No straw Medication Administration: Crushed with puree Supervision: Staff to assist with self feeding Compensations: Slow rate;Small sips/bites Postural Changes and/or Swallow Maneuvers: Seated upright 90 degrees                Oral Care Recommendations: Oral care BID Follow Up Recommendations: Skilled nursing-short term rehab (<3 hours/day) Assistance recommended at discharge: Frequent or constant Supervision/Assistance SLP Visit Diagnosis: Dysphagia, oropharyngeal phase (R13.12) Plan: Continue with current plan of care          Tnia Anglada I. Hardin Negus, [place], [place] Office number [phone] Pager [place]  08/14/2021, 10:21 AM

## 2021-08-14 NOTE — Progress Notes (Signed)
[facility]) Hospital Liaison note:  This patient is currently enrolled in [facility] outpatient-based Palliative Care. Will continue to follow for disposition.  Please call with any outpatient palliative questions or concerns.  Thank you, Lorelee Market, LPN [facility] Liaison [phone]

## 2021-08-14 NOTE — Hospital Course (Addendum)
Julia [name] is a 88 y.o. Caucasian 87 year old female with history of hypertension, diabetes, Alzheimer disease, stroke and TIA presented to ED for code stroke.  Slow to improve.  Currently not eating.  May need hospice if PO intake does not improve.  Palliative care consulted.

## 2021-08-14 NOTE — TOC Initial Note (Addendum)
Transition of Care [facility]) - Initial/Assessment Note    Patient Details  Name: Julia [name] MRN: [id number] Date of Birth: 02-Apr-1928  Transition of Care [facility]) CM/SW Contact:    Benard Halsted, LCSW Phone Number: 08/14/2021, 9:54 AM  Clinical Narrative:                 9:54am-CSW spoke with patient's son, Julia [name], regarding discharge plan for patient. He reported that patient resides at [facility] and enjoys the care she receives there. He stated he would love for patient to be able to return there at discharge as it is familiar to her but understands if she requires SNF placement short term. CSW will follow up with [facility] to see their recommendations.   12:30pm-CSW spoke with Lovey Newcomer at [facility] and she stated that they would not have the staffing for 2-person assist and recommend patient go to SNF rehab for a little while before returning. CSW will complete workup.   5pm-CSW spoke with patient's son regarding SNF choices and he is requesting [facility]. CSW left voicemail with their admissions liaison expressing interest. CSW will continue to follow pending progression of patient's oral intake.  Expected Discharge Plan: Memory Care Barriers to Discharge: Continued Medical Work up   Patient Goals and CMS Choice Patient states their goals for this hospitalization and ongoing recovery are:: Rehab CMS Medicare.gov Compare Post Acute Care list provided to:: Patient Represenative (must comment) Choice offered to / list presented to : Adult Children  Expected Discharge Plan and Services Expected Discharge Plan: Memory Care In-house Referral: Clinical Social Work   Post Acute Care Choice: Resumption of Svcs/PTA Provider Living arrangements for the past 2 months: [place]                                      Prior Living Arrangements/Services Living arrangements for the past 2 months: [place] Lives with:: Facility  Resident Patient language and need for interpreter reviewed:: Yes Do you feel safe going back to the place where you live?: Yes      Need for Family Participation in Patient Care: Yes (Comment) Care giver support system in place?: Yes (comment) Current home services: DME, Home PT Criminal Activity/Legal Involvement Pertinent to Current Situation/Hospitalization: No - Comment as needed  Activities of Daily Living Home Assistive Devices/Equipment: Wheelchair ADL Screening (condition at time of admission) Patient's cognitive ability adequate to safely complete daily activities?: No Is the patient deaf or have difficulty hearing?: No Does the patient have difficulty seeing, even when wearing glasses/contacts?: No Does the patient have difficulty concentrating, remembering, or making decisions?: Yes Patient able to express need for assistance with ADLs?: No Does the patient have difficulty dressing or bathing?: Yes Independently performs ADLs?: No Communication: Independent Dressing (OT): Dependent Is this a change from baseline?: Pre-admission baseline Grooming: Dependent Is this a change from baseline?: Pre-admission baseline Feeding: Needs assistance Is this a change from baseline?: Pre-admission baseline Bathing: Dependent Is this a change from baseline?: Pre-admission baseline Toileting: Needs assistance Is this a change from baseline?: Pre-admission baseline In/Out Bed: Dependent Is this a change from baseline?: Pre-admission baseline Walks in Home: Dependent Is this a change from baseline?: Pre-admission baseline Does the patient have difficulty walking or climbing stairs?: Yes Weakness of Legs: Both Weakness of Arms/Hands: Both  Permission Sought/Granted Permission sought to share information with : Customer service manager,  Family Supports Permission granted to share information with : Yes, Verbal Permission Granted  Share Information with NAME: Julia [name]  Permission  granted to share info w AGENCY: [facility]  Permission granted to share info w Relationship: Son  Permission granted to share info w Contact Information: [phone]  Emotional Assessment Appearance:: Appears stated age Attitude/Demeanor/Rapport: Unable to Assess Affect (typically observed): Unable to Assess Orientation: : Oriented to Self Alcohol / Substance Use: Not Applicable Psych Involvement: No (comment)  Admission diagnosis:  Stroke (cerebrum) ([place]) [I63.9] Acute ischemic stroke [facility]) [I63.9] Patient Active Problem List   Diagnosis Date Noted   Stroke (cerebrum) ([place]) 08/11/2021   TIA (transient ischemic attack) 07/20/2021   Acute lower UTI 07/20/2021   Hyperglycemia due to diabetes mellitus ([place]) 07/19/2021   Essential hypertension 07/19/2021   Alzheimer's disease ([place]) 07/19/2021   PCP:  Glenda Chroman, MD Pharmacy:   [facility], [address] [place] [address] Phone: [phone] Fax: [phone]     Social Determinants of Health (SDOH) Interventions    Readmission Risk Interventions Readmission Risk Prevention Plan 08/14/2021  Transportation Screening Complete  PCP or Specialist Appt within 5-7 Days Complete  Home Care Screening Complete  Medication Review (RN CM) Complete  Some recent data might be hidden

## 2021-08-14 NOTE — Evaluation (Signed)
Occupational Therapy Evaluation Patient Details Name: Julia [name] MRN: [id number] DOB: 1928/03/16 Today's Date: 08/14/2021   History of Present Illness Pt is 88 yo female from ALF where she was able to transfer with assist who was found unresponsive with L gaze after her therapy session. MRI neg for acute changes but showed remote parietal infarct.  PMH: multiple CVA's and TIA's affecting left side, Alzheimers, anxiety, HTN.   Clinical Impression   This 88 yo female admitted with above presents to acute OT with PLOF of being able to A with bed mobility, transfers, and ADLs. Currently pt tends to keep her eyes closed 99% of session, follows minimal one step commands and is total A for all self care and mobility with +2 needed for transfers. She will continue to benefit from acute OT with follow up at SNF (or ALF if they can provide total care and therapy).      Recommendations for follow up therapy are one component of a multi-disciplinary discharge planning process, led by the attending physician.  Recommendations may be updated based on patient status, additional functional criteria and insurance authorization.   Follow Up Recommendations  Skilled nursing-short term rehab (<3 hours/day)    Assistance Recommended at Discharge Frequent or constant Supervision/Assistance  Patient can return home with the following Two people to help with walking and/or transfers;Two people to help with bathing/dressing/bathroom;Assistance with feeding;Help with stairs or ramp for entrance;Assistance with cooking/housework;Assist for transportation;Direct supervision/assist for financial management;Direct supervision/assist for medications management    Functional Status Assessment  Patient has had a recent decline in their functional status and demonstrates the ability to make significant improvements in function in a reasonable and predictable amount of time.  Equipment Recommendations  Other (comment) (TBD  next venue)       Precautions / Restrictions Precautions Precautions: Fall Restrictions Weight Bearing Restrictions: No      Mobility Bed Mobility Overal bed mobility: Needs Assistance Bed Mobility: Supine to Sit, Sit to Supine     Supine to sit: Total assist, HOB elevated (did not A for legs at all, A'd minimally for trunk) Sit to supine: Total assist, +2 for physical assistance        Transfers Overall transfer level: Needs assistance   Transfers: Sit to/from Stand Sit to Stand: Total assist, +2 physical assistance           General transfer comment: Attempted to have pt stand x2 from EOB, she helped a trace amount      Balance Overall balance assessment: Needs assistance Sitting-balance support: No upper extremity supported, Feet supported Sitting balance-Leahy Scale: Poor Sitting balance - Comments: min A to maintain sitting Postural control: Left lateral lean     Standing balance comment: unable with +2 total A                           ADL either performed or assessed with clinical judgement   ADL                                         General ADL Comments: total A     Vision   Additional Comments: unable to tell--eyes where only open 1% of sesson and less than 10 seconds each time            Pertinent Vitals/Pain Pain Assessment Pain Assessment: Faces Faces Pain Scale:  No hurt     Hand Dominance Right   Extremity/Trunk Assessment Upper Extremity Assessment Upper Extremity Assessment: RUE deficits/detail;LUE deficits/detail RUE Deficits / Details: Can hold up hand and touch chin when asked; for washing face once I placed washcloth in her right hand she needed hand over hand to get started and then only washed right side of face--had to do hand over hand again to move to left side of face--was not thorough for either side. RUE Coordination: decreased fine motor;decreased gross motor LUE Deficits / Details: tone  from prior stroke, could get fingers and elbow fully extended with increased time for elbow LUE Coordination: decreased gross motor;decreased fine motor           Communication Communication Communication: Expressive difficulties;Receptive difficulties   Cognition Arousal/Alertness: Lethargic Behavior During Therapy: Flat affect Overall Cognitive Status: Impaired/Different from baseline                                 General Comments: pt lethargic, not ususally this way, eyes closed 99% of session. When asking son how many grandkids she had she said 3.5 (pt has 3 grandkids and 1 great grandkid)                Home Living Family/patient expects to be discharged to:: Skilled nursing facility                                        Prior Functioning/Environment Prior Level of Function : Needs assist  Cognitive Assist : Mobility (cognitive);ADLs (cognitive) Mobility (Cognitive): Intermittent cues ADLs (Cognitive): Step by step cues Physical Assist : Mobility (physical);ADLs (physical) Mobility (physical): Bed mobility;Transfers ADLs (physical): Feeding;Grooming;Bathing;Dressing;Toileting;IADLs Mobility Comments: Per son--pt mostly W/C bound, could A with transfer by pulling up, could A with some of her B/D/toileting and could eat post set up. ADLs Comments: assisted by ALF staff        OT Problem List: Decreased strength;Decreased range of motion;Impaired balance (sitting and/or standing);Decreased activity tolerance;Impaired vision/perception;Decreased coordination;Decreased cognition;Decreased safety awareness;Impaired UE functional use;Impaired tone      OT Treatment/Interventions: Self-care/ADL training;DME and/or AE instruction;Patient/family education;Balance training    OT Goals(Current goals can be found in the care plan section) Acute Rehab OT Goals Patient Stated Goal: son hopeful pt can go back to her ALF OT Goal Formulation:  Patient unable to participate in goal setting Time For Goal Achievement: 08/28/21 Potential to Achieve Goals: Fair  OT Frequency: Min 2X/week       AM-PAC OT "6 Clicks" Daily Activity     Outcome Measure Help from another person eating meals?: Total Help from another person taking care of personal grooming?: Total Help from another person toileting, which includes using toliet, bedpan, or urinal?: Total Help from another person bathing (including washing, rinsing, drying)?: Total Help from another person to put on and taking off regular upper body clothing?: Total Help from another person to put on and taking off regular lower body clothing?: Total 6 Click Score: 6   End of Session Equipment Utilized During Treatment: Gait belt Nurse Communication: Mobility status  Activity Tolerance: Patient limited by lethargy Patient left: in bed;with call bell/phone within reach;with bed alarm set;with family/visitor present  OT Visit Diagnosis: Unsteadiness on feet (R26.81);Other abnormalities of gait and mobility (R26.89);Muscle weakness (generalized) (M62.81);Other symptoms and signs involving cognitive function;Hemiplegia and hemiparesis  Hemiplegia - Right/Left: Left Hemiplegia - dominant/non-dominant: Non-Dominant Hemiplegia - caused by:  (nothing new seen on MRI; remote infarcts)                Time: 9629-5284 OT Time Calculation (min): 46 min Charges:  OT General Charges $OT Visit: 1 Visit OT Evaluation $OT Eval Moderate Complexity: 1 Mod OT Treatments $Self Care/Home Management : 23-37 mins  [place]en Circle, OTR/L Acute [employer] Pager [phone] Office [phone]    Almon Register 08/14/2021, 11:50 AM

## 2021-08-14 NOTE — Assessment & Plan Note (Addendum)
-  Post TNKase CT 2/10 @ 1458 - Interval acute hemorrhage within the RIGHT thalamus extending into internal capsule measuring 2.7 x 1.9 x 1.6 cm. -Repeat Head CT 2/10 @ 2100- No significant interval change in size of right thalamic intraparenchymal hemorrhage, Minimal localized edema without significant regional mass effect. No intraventricular extension or other complicating features. -CTA head & neck- Multifocal severe stenosis of the right vertebral artery in the neck with occlusion at its dural margin and largely occluded intradurally. Severe distal A3/A4 ACA stenosis bilaterally with intermittent/poor distal opacification. Suspected severe bilateral M2 MCA stenosis, although evaluation is limited by motion. Moderate to severe left P2 PCA stenosis. Small (1-2 mm) outpouching at the right proximal basilar artery -MRI- Bland appearing right thalamic hematoma. Remote parasagittal right parietal cortex infarct. Small remote right cerebellar infarct. remote hypertensive pattern hemorrhages. -2D Echo LVEF 60-65% - LDL-64.9 (at goal) -HgbA1c 5.4 (at goal) - No antithombotic recommended at this time.

## 2021-08-14 NOTE — Assessment & Plan Note (Addendum)
Well controlled 

## 2021-08-15 DIAGNOSIS — Z515 Encounter for palliative care: Secondary | ICD-10-CM

## 2021-08-15 DIAGNOSIS — F0280 Dementia in other diseases classified elsewhere without behavioral disturbance: Secondary | ICD-10-CM

## 2021-08-15 DIAGNOSIS — G309 Alzheimer's disease, unspecified: Secondary | ICD-10-CM

## 2021-08-15 DIAGNOSIS — R627 Adult failure to thrive: Secondary | ICD-10-CM

## 2021-08-15 DIAGNOSIS — N390 Urinary tract infection, site not specified: Secondary | ICD-10-CM

## 2021-08-15 DIAGNOSIS — Z66 Do not resuscitate: Secondary | ICD-10-CM

## 2021-08-15 LAB — BASIC METABOLIC PANEL
Anion gap: 9 (ref 5–15)
BUN: 13 mg/dL (ref 8–23)
CO2: 20 mmol/L — ABNORMAL LOW (ref 22–32)
Calcium: 9.1 mg/dL (ref 8.9–10.3)
Chloride: 109 mmol/L (ref 98–111)
Creatinine, Ser: 1.02 mg/dL — ABNORMAL HIGH (ref 0.44–1.00)
GFR, Estimated: 52 mL/min — ABNORMAL LOW (ref >60)
Glucose, Bld: 110 mg/dL — ABNORMAL HIGH (ref 70–99)
Potassium: 3.5 mmol/L (ref 3.5–5.1)
Sodium: 138 mmol/L (ref 135–145)

## 2021-08-15 LAB — CBC
HCT: 25.5 % — ABNORMAL LOW (ref 36.0–46.0)
Hemoglobin: 8.4 g/dL — ABNORMAL LOW (ref 12.0–15.0)
MCH: 28 pg (ref 26.0–34.0)
MCHC: 32.9 g/dL (ref 30.0–36.0)
MCV: 85 fL (ref 80.0–100.0)
Platelets: 165 10*3/uL (ref 150–400)
RBC: 3 MIL/uL — ABNORMAL LOW (ref 3.87–5.11)
RDW: 13.3 % (ref 11.5–15.5)
WBC: 4.9 10*3/uL (ref 4.0–10.5)
nRBC: 0 % (ref 0.0–0.2)

## 2021-08-15 LAB — GLUCOSE, CAPILLARY
Glucose-Capillary: 104 mg/dL — ABNORMAL HIGH (ref 70–99)
Glucose-Capillary: 108 mg/dL — ABNORMAL HIGH (ref 70–99)
Glucose-Capillary: 115 mg/dL — ABNORMAL HIGH (ref 70–99)
Glucose-Capillary: 118 mg/dL — ABNORMAL HIGH (ref 70–99)
Glucose-Capillary: 123 mg/dL — ABNORMAL HIGH (ref 70–99)
Glucose-Capillary: 159 mg/dL — ABNORMAL HIGH (ref 70–99)
Glucose-Capillary: 99 mg/dL (ref 70–99)

## 2021-08-15 MED ORDER — SODIUM CHLORIDE 0.9 % IV SOLN
1.0000 g | INTRAVENOUS | Status: DC
  Administered 2021-08-15 – 2021-08-17 (×3): 1 g via INTRAVENOUS
  Filled 2021-08-15 (×3): qty 10

## 2021-08-15 NOTE — Progress Notes (Addendum)
STROKE TEAM PROGRESS NOTE   INTERVAL HISTORY Patient is seen in her room with her son at the bedside.  She has been hemodynamically stable and her neurological exam is stable.  She has passed her swallow exam and is now on a dysphagia 3 diet.  Her p.o. intake however remains poor and may need hospice consult if it does not improve.  She appears drowsy this morning and is not cooperative with exam.  Vital signs stable.  Vitals:   08/15/21 1156 08/15/21 1200 08/15/21 1300 08/15/21 1400  BP:  (!) 155/50 (!) 155/57 (!) 156/52  Pulse:  70 69 70  Resp:  16 16 19   Temp: 98.4 F (36.9 C)     TempSrc: Axillary     SpO2:  97% 95% 94%  Weight:      Height:       CBC:  Recent Labs  Lab 08/11/21 1157 08/12/21 0201 08/14/21 0552 08/15/21 0342  WBC 9.3   < > 5.3 4.9  NEUTROABS 7.1  --   --   --   HGB 11.4*   < > 8.5* 8.4*  HCT 35.5*   < > 25.4* 25.5*  MCV 88.8   < > 85.2 85.0  PLT 236   < > 183 165   < > = values in this interval not displayed.   Basic Metabolic Panel:  Recent Labs  Lab 08/14/21 0552 08/15/21 0342  NA 140 138  K 3.6 3.5  CL 110 109  CO2 21* 20*  GLUCOSE 90 110*  BUN 13 13  CREATININE 1.08* 1.02*  CALCIUM 8.7* 9.1   Lipid Panel:  Recent Labs  Lab 08/12/21 0201 08/13/21 0504  CHOL 115  --   TRIG 424* 105  HDL 20*  --   CHOLHDL 5.8  --   VLDL UNABLE TO CALCULATE IF TRIGLYCERIDE OVER 400 mg/dL  --   LDLCALC UNABLE TO CALCULATE IF TRIGLYCERIDE OVER 400 mg/dL  --    HgbA1c:  Recent Labs  Lab 08/12/21 0201  HGBA1C 5.4   Urine Drug Screen: No results for input(s): LABOPIA, COCAINSCRNUR, LABBENZ, AMPHETMU, THCU, LABBARB in the last 168 hours.  Alcohol Level  Recent Labs  Lab 08/11/21 [place] <10    IMAGING past 24 hours No results found.  PHYSICAL EXAM     Physical Exam  Constitutional: Frail elderly lady not in distress. Cardiovascular: Normal rate and regular rhythm.  Respiratory: Effort normal, non-labored breathing  Neuro: Mental  Status: Patient is awake  and alert, does not follow commands, some appropriate verbal output.      Cranial Nerves: II: Visual Fields does not blink to threat on either side.. Pupils are equal, round, and reactive to light.   VII: Facial movement show left nasolabial fold asymmetry. VIII: No reaction to verbal stimuli XI: Head is midline XII: Does not protrude tongue Motor:  She is moving all extremities spontaneously  Sensory: Sensation grossly intact in all extremities Cerebellar: Unable to complete   ASSESSMENT/PLAN Ms. Julia [name] is a 88 y.o. female with history of hypertension, diabetes, Alzheimer disease, stroke (08/2020) and TIA (07/2021) presenting after being found unresponsive, nonverbal, with a left gaze by staff at her nursing home. She was given TNKase at [facility], ED and had a HT. TNKase reversed with TXA and cryo. Barium swallow today.   Stroke: Likely right thalamic infarct now with hemorrhagic transformation after TNK administration likely secondary due small vessel disease Code Stroke- Slightly increased hypoattenuation in the right  thalamus. Remote right frontoparietal infarct and similar advanced chronic microvascular ischemic disease Post TNKase CT 2/10 @ 1458 - Interval acute hemorrhage within the RIGHT thalamus extending into internal capsule measuring 2.7 x 1.9 x 1.6 cm. Repeat Head CT 2/10 @ 2100- No significant interval change in size of right thalamic intraparenchymal hemorrhage, Minimal localized edema without significant regional mass effect. No intraventricular extension or other complicating features. CTA head & neck- Multifocal severe stenosis of the right vertebral artery in the neck with occlusion at its dural margin and largely occluded intradurally. Severe distal A3/A4 ACA stenosis bilaterally with intermittent/poor distal opacification. Suspected severe bilateral M2 MCA stenosis, although evaluation is limited by motion. Moderate to severe left P2 PCA  stenosis. Small (1-2 mm) outpouching at the right proximal basilar artery MRI- Bland appearing right thalamic hematoma. Remote parasagittal right parietal cortex infarct. Small remote right cerebellar infarct. remote hypertensive pattern hemorrhages. 2D Echo LVEF 60-65% Direct LDL-64.9 HgbA1c 5.4 VTE prophylaxis - SCDs aspirin 81 mg daily and clopidogrel 75 mg daily prior to admission, now on No antithrombotic.  Therapy recommendations:  SNF Disposition: Pending  Hemorrhagic transformation Hematoma stabilized. No significant mass effect  Hypertension Home meds: Norvasc, lisinopril BP goal less than 160 Long-term BP goal normotensive Cleviprex 8mg - titrate off Prn labetalol and hydralazine for BP control  Hyperlipidemia Home meds: Pravastatin LDL 64.9, goal < 70 Resume when medically appropriate   Other Stroke Risk Factors Advanced Age >/= 2  Obesity, Body mass index is 25.39 kg/m., BMI >/= 30 associated with increased stroke risk, recommend weight loss, diet and exercise as appropriate  Hx stroke/TIA In 07/2021 patient had episode of "left facial droop, left sided weakness with slurry speech. She was sent to ED by EMS. Symptoms were resolved on arrival, no tPA given. BP 180s, and CT head showed age--indeterminate posterior right ACA territory infarct involving the parasagittal right frontoparietal region. However, this is new comparing with her 10/2020 CT head." MRI  No acute infarct. Remote right parietal cortical infarct that is new compared to 2020.  EF 60 to 65%.  EEG no seizure.  LDL 93, A1c 5.7.  Consider TIA episode, she was discharged on aspirin and Plavix DAPT for 3 weeks and then Plavix alone, as well as pravastatin 20.  Other Active Problems Elevated triglycerides-424 Dysphagia SLP eval- recommend barium swallow study Restart home meds when able  Hospital day # 4   Patient is making slow improvement.  Encourage p.o. diet.  Mobilize out of bed.  Continue therapy  consults.  Transfer back to skilled nursing facility when bed available.  If p.o. intake does not improve may need to consider hospice long discussion patient and son at the bedside and answered questions.  Greater than 50% time during this 35-minute visit was spent on counseling and coordination of care discussion with patient care team and answered questions. Discussed with Dr. Eliseo Squires.  Stroke team will sign off.  Kindly call for questions Antony Contras, MD Medical Director [place] Pager: [phone] 08/15/2021 2:55 PM   To contact Stroke Continuity provider, please refer to [email or web address] After hours, contact General Neurology

## 2021-08-15 NOTE — Assessment & Plan Note (Addendum)
Family not interested in feeding tube. If she continues to not eat would be more residential hospice appropriate than SNF appropriate  - Continue follow up with [facility] as outpatient who can help guide timing of transition to hospice when appropriate.

## 2021-08-15 NOTE — Progress Notes (Signed)
Physical Therapy Treatment Patient Details Name: Kikue Gerhart MRN: [id number] DOB: 16-Jun-1928 Today's Date: 08/15/2021   History of Present Illness Pt is 88 yo female from ALF where she was able to transfer with assist who was found unresponsive with L gaze after her therapy session. MRI neg for acute changes but showed remote parietal infarct.  PMH: multiple CVA's and TIA's affecting left side, Alzheimers, anxiety, HTN.    PT Comments    Pt's son present during session, he reports that he was able to get pt to eat a banana this morning. Pt more alert than during last session with this therapist on Saturday. Pt opened eyes when cued L>R and verbalized a full sentence, expressing fear of falling off EOB with transition supine to sit. Despite being more cognitively aware, from a mobility standpoint, pt needing tot A +2 to come to EOB and min/ mod A to maintain sitting due to posterior lean. Worked on fwd wt shift in prep for standing but pt avoiding use of LLE and knee maintained in 60-90 deg of flexion so did not attempt standing. Pt quickly falling asleep after return to supine. PT will continue to follow.    Recommendations for follow up therapy are one component of a multi-disciplinary discharge planning process, led by the attending physician.  Recommendations may be updated based on patient status, additional functional criteria and insurance authorization.  Follow Up Recommendations  Skilled nursing-short term rehab (<3 hours/day)     Assistance Recommended at Discharge Frequent or constant Supervision/Assistance  Patient can return home with the following Two people to help with walking and/or transfers;Two people to help with bathing/dressing/bathroom;Assistance with feeding;Assistance with cooking/housework;Direct supervision/assist for medications management;Direct supervision/assist for financial management;Assist for transportation;Help with stairs or ramp for entrance   Equipment  Recommendations  None recommended by PT    Recommendations for Other Services       Precautions / Restrictions Precautions Precautions: Fall Restrictions Weight Bearing Restrictions: No     Mobility  Bed Mobility Overal bed mobility: Needs Assistance Bed Mobility: Supine to Sit, Sit to Supine     Supine to sit: Total assist, HOB elevated, +2 for physical assistance Sit to supine: Total assist, +2 for physical assistance   General bed mobility comments: mvmt to EOB initiated with LE's and pt did engage core for side lean to sit but could not get pt to grasp rail with R hand. Posterior lean in sitting    Transfers                   General transfer comment: pt not following commands well enough to attempt standing today and avoiding use of LLE    Ambulation/Gait               General Gait Details: non ambulatory at baseline   Stairs             Wheelchair Mobility    Modified Rankin (Stroke Patients Only) Modified Rankin (Stroke Patients Only) Pre-Morbid Rankin Score: Moderately severe disability Modified Rankin: Severe disability     Balance Overall balance assessment: Needs assistance Sitting-balance support: No upper extremity supported, Feet supported Sitting balance-Leahy Scale: Poor Sitting balance - Comments: min A to maintain sitting Postural control: Posterior lean                                  Cognition Arousal/Alertness: Lethargic Behavior During Therapy: Flat affect Overall  Cognitive Status: Impaired/Different from baseline                                 General Comments: pt opening eyes when cued and following about 50% of commands involving L side of body. Not following commands with R side. Did verbalize during session "don't pull me off the bed".  Son reports that he was able to get her to eat a banana this morning        Exercises Total Joint Exercises Long Arc Quad: AAROM, Both, 10  reps, Seated (L knee very tight, could not extend last 20 deg)    General Comments General comments (skin integrity, edema, etc.): VSS on RA      Pertinent Vitals/Pain Pain Assessment Pain Assessment: Faces Faces Pain Scale: Hurts a little bit Pain Location: LLE with mvmt Pain Descriptors / Indicators: Grimacing, Tightness Pain Intervention(s): Limited activity within patient's tolerance, Monitored during session    Home Living                          Prior Function            PT Goals (current goals can now be found in the care plan section) Acute Rehab PT Goals Patient Stated Goal: unable to state PT Goal Formulation: Patient unable to participate in goal setting Time For Goal Achievement: 08/26/21 Potential to Achieve Goals: Fair Progress towards PT goals: Progressing toward goals    Frequency    Min 3X/week      PT Plan Current plan remains appropriate    Co-evaluation              AM-PAC PT "6 Clicks" Mobility   Outcome Measure  Help needed turning from your back to your side while in a flat bed without using bedrails?: Total Help needed moving from lying on your back to sitting on the side of a flat bed without using bedrails?: Total Help needed moving to and from a bed to a chair (including a wheelchair)?: Total Help needed standing up from a chair using your arms (e.g., wheelchair or bedside chair)?: Total Help needed to walk in hospital room?: Total Help needed climbing 3-5 steps with a railing? : Total 6 Click Score: 6    End of Session   Activity Tolerance: Patient limited by lethargy Patient left: in bed;with call bell/phone within reach;with bed alarm set;with family/visitor present Nurse Communication: Mobility status PT Visit Diagnosis: Muscle weakness (generalized) (M62.81)     Time: 2119-4174 PT Time Calculation (min) (ACUTE ONLY): 19 min  Charges:  $Therapeutic Activity: 8-22 mins                     [place]  Pager [phone] Office [place] 08/15/2021, 1:27 PM

## 2021-08-15 NOTE — Progress Notes (Signed)
°  Progress Note   Patient: Julia [name] WCB:[id number] DOB: Feb 09, 1928 DOA: 08/11/2021     4 DOS: the patient was seen and examined on 08/15/2021   Brief hospital course: Marykay Pakistan is a 88 y.o. Caucasian 88 year old female with history of hypertension, diabetes, Alzheimer disease, stroke and TIA presented to ED for code stroke.  Slow to improve.  Currently not eating.  May need hospice if PO intake does not improve.  Palliative care consulted.  Assessment and Plan: * Stroke (cerebrum) ([place])- (present on admission) -neurology consult -Post TNKase CT 2/10 @ 1458 - Interval acute hemorrhage within the RIGHT thalamus extending into internal capsule measuring 2.7 x 1.9 x 1.6 cm. -Repeat Head CT 2/10 @ 2100- No significant interval change in size of right thalamic intraparenchymal hemorrhage, Minimal localized edema without significant regional mass effect. No intraventricular extension or other complicating features. -CTA head & neck- Multifocal severe stenosis of the right vertebral artery in the neck with occlusion at its dural margin and largely occluded intradurally. Severe distal A3/A4 ACA stenosis bilaterally with intermittent/poor distal opacification. Suspected severe bilateral M2 MCA stenosis, although evaluation is limited by motion. Moderate to severe left P2 PCA stenosis. Small (1-2 mm) outpouching at the right proximal basilar artery -MRI- Bland appearing right thalamic hematoma. Remote parasagittal right parietal cortex infarct. Small remote right cerebellar infarct. remote hypertensive pattern hemorrhages. -2D Echo LVEF 60-65% - LDL-64.9 -HgbA1c 5.4 -no antithombotic  DNR (do not resuscitate) -not eating and family not interested in feeding tube -if she continues to not eat would be more residential hospice appropriate than SNF appropriate  -palliative care consult  Acute lower UTI- (present on admission) -partial treated at ALF -will do trial of IV rocephin as son concerned this  is why she is not awake/eating -patient unable to provide history   Alzheimer's disease ([place])- (present on admission) -from a memory care unit ([facility] in [place])  Essential hypertension- (present on admission) -BP goal less than 160 -PRN IV  Hyperglycemia due to diabetes mellitus ([place])- (present on admission) -SSI -not eating well/at all so no need for long acting        Subjective: minimally responsive-- only to sternal rub  Physical Exam: Vitals:   08/15/21 0800 08/15/21 0900 08/15/21 1000 08/15/21 1100  BP: (!) 152/50 (!) 140/42 (!) 141/59 (!) 149/51  Pulse: 70 67 67 71  Resp: (!) 22 (!) 21 (!) 21 20  Temp: 98.7 F (37.1 C)     TempSrc: Axillary     SpO2: 94% 93% 93% 95%  Weight:      Height:        General: Appearance:     Overweight female in bed, minimally responsive     Lungs:      respirations unlabored  Heart:    Normal heart rate.    MS:   All extremities are intact.    Neurologic:   Not awake, not responsive to voice-- did respond to sternal rub but only moaned     Data Reviewed: Hgb 8.4 Blood sugars stable CR stable  Family Communication: son at bedside  Disposition: Status is: Inpatient Remains inpatient appropriate because: needs          Planned Discharge Destination:  tbd: SNF vs hospice     Time spent: 55 minutes  Author: Geradine Girt, DO 08/15/2021 11:20 AM  For on call review [email or web address].

## 2021-08-15 NOTE — Assessment & Plan Note (Addendum)
-   Continue ceftriaxone pending culture data.

## 2021-08-16 DIAGNOSIS — I10 Essential (primary) hypertension: Secondary | ICD-10-CM

## 2021-08-16 DIAGNOSIS — E1165 Type 2 diabetes mellitus with hyperglycemia: Secondary | ICD-10-CM

## 2021-08-16 DIAGNOSIS — R638 Other symptoms and signs concerning food and fluid intake: Secondary | ICD-10-CM

## 2021-08-16 LAB — BASIC METABOLIC PANEL
Anion gap: 9 (ref 5–15)
BUN: 17 mg/dL (ref 8–23)
CO2: 21 mmol/L — ABNORMAL LOW (ref 22–32)
Calcium: 9.1 mg/dL (ref 8.9–10.3)
Chloride: 109 mmol/L (ref 98–111)
Creatinine, Ser: 1.04 mg/dL — ABNORMAL HIGH (ref 0.44–1.00)
GFR, Estimated: 51 mL/min — ABNORMAL LOW (ref >60)
Glucose, Bld: 123 mg/dL — ABNORMAL HIGH (ref 70–99)
Potassium: 3.7 mmol/L (ref 3.5–5.1)
Sodium: 139 mmol/L (ref 135–145)

## 2021-08-16 LAB — CBC
HCT: 26.6 % — ABNORMAL LOW (ref 36.0–46.0)
Hemoglobin: 8.9 g/dL — ABNORMAL LOW (ref 12.0–15.0)
MCH: 28.3 pg (ref 26.0–34.0)
MCHC: 33.5 g/dL (ref 30.0–36.0)
MCV: 84.7 fL (ref 80.0–100.0)
Platelets: 189 10*3/uL (ref 150–400)
RBC: 3.14 MIL/uL — ABNORMAL LOW (ref 3.87–5.11)
RDW: 13.5 % (ref 11.5–15.5)
WBC: 4.8 10*3/uL (ref 4.0–10.5)
nRBC: 0 % (ref 0.0–0.2)

## 2021-08-16 LAB — GLUCOSE, CAPILLARY
Glucose-Capillary: 110 mg/dL — ABNORMAL HIGH (ref 70–99)
Glucose-Capillary: 111 mg/dL — ABNORMAL HIGH (ref 70–99)
Glucose-Capillary: 139 mg/dL — ABNORMAL HIGH (ref 70–99)
Glucose-Capillary: 117 mg/dL — ABNORMAL HIGH (ref 70–99)
Glucose-Capillary: 140 mg/dL — ABNORMAL HIGH (ref 70–99)
Glucose-Capillary: 115 mg/dL — ABNORMAL HIGH (ref 70–99)

## 2021-08-16 MED ORDER — AMLODIPINE BESYLATE 5 MG PO TABS
5.0000 mg | ORAL_TABLET | Freq: Every day | ORAL | Status: DC
  Administered 2021-08-17: 5 mg via ORAL
  Filled 2021-08-16: qty 1

## 2021-08-16 MED ORDER — SALINE SPRAY 0.65 % NA SOLN
1.0000 | NASAL | Status: DC | PRN
  Administered 2021-08-16: 1 via NASAL
  Filled 2021-08-16: qty 44

## 2021-08-16 NOTE — Consult Note (Signed)
Consultation Note Date: 08/16/2021   Patient Name: Julia [name]  DOB: 11-May-1928  MRN: [id number]  Age / Sex: 88 y.o., female  PCP: Glenda Chroman, MD Referring Physician: Patrecia Pour, MD  Reason for Consultation: Establishing goals of care and Psychosocial/spiritual support  HPI/Patient Profile: 88 y.o. female   admitted on 08/11/2021  with history of hypertension, diabetes, Alzheimer disease, stroke and TIA presented to ED for code stroke.  Patient with continued slow physical, functional and cognitive decline over the past several years.  This decline started after stroke approximately 2 years ago, attempt at rehab at [facility] and then need for continued 24-hour care/[facility] memory care.  Currently patient is lethargic, taking poor oral intake(family is not interested in artificial feeding now or in the future), overall failure to thrive.   Family face treatment option decisions, Advanced directive decisions and anticipatory care needs.    Clinical Assessment and Goals of Care:  This NP Wadie Lessen reviewed medical records, received report from team, assessed the patient and then meet at the patient's bedside along with her son/ Pami Wool to discuss diagnosis, prognosis, GOC, EOL wishes disposition and options.   Concept of Palliative Care was introduced as specialized medical care for people and their families living with serious illness.  If focuses on providing relief from the symptoms and stress of a serious illness.  The goal is to improve quality of life for both the patient and the family. Values and goals of care important to patient and family were attempted to be elicited.  Created space and opportunity for family to explore thoughts and feelings regarding current medical situation.  Ultimately comfort and quality are priority for this patient, however at this time family hope to treat the  treatable and see if there is any improvement, and possible return to baseline.     A  discussion was had today regarding advanced directives.  Concepts specific to code status, artifical feeding and hydration, continued IV antibiotics and rehospitalization was had.  The difference between a aggressive medical intervention path  and a palliative comfort care path for this patient at this time was had.     MOST form introduced   Questions and concerns addressed.  Patient  encouraged to call with questions or concerns.     PMT will continue to support holistically.     Plan is to meet again on Thursday at 130 for continued conversation regarding goals of care and completion of MOST form.      Patient does have advance care planning documents    No ACP documents in [place] at this time.  Patient's son tells me that he and his brother are the main decision makers for [name].     SUMMARY OF RECOMMENDATIONS    Code Status/Advance Care Planning: DNR    Palliative Prophylaxis:  Aspiration, Bowel Regimen, Delirium Protocol, Frequent Pain Assessment, and Oral Care  Additional Recommendations (Limitations, Scope, Preferences): Avoid Hospitalization and No Artificial Feeding  Psycho-social/Spiritual:  Desire for further Chaplaincy support:no  Additional Recommendations: Education on Hospice  Prognosis:  Unable to determine  Discharge Planning: To Be Determined      Primary Diagnoses: Present on Admission:  Stroke (cerebrum) ([place])  Alzheimer's disease ([place])  Hyperglycemia due to diabetes mellitus ([place])  Essential hypertension  Acute lower UTI   I have reviewed the medical record, interviewed the patient and family, and examined the patient. The following aspects are pertinent.  Past Medical History:  Diagnosis Date   Alzheimer disease ([place])    Anxiety    CVA (cerebral vascular accident) ([place])    Hypertension    Social History   Socioeconomic History   Marital  status: Widowed    Spouse name: Not on file   Number of children: Not on file   Years of education: Not on file   Highest education level: Not on file  Occupational History   Not on file  Tobacco Use   Smoking status: Unknown   Smokeless tobacco: Not on file  Substance and Sexual Activity   Alcohol use: Not on file   Drug use: Not on file   Sexual activity: Not on file  Other Topics Concern   Not on file  Social History Narrative   Not on file   Social Determinants of Health   Financial Resource Strain: Not on file  Food Insecurity: Not on file  Transportation Needs: Not on file  Physical Activity: Not on file  Stress: Not on file  Social Connections: Not on file   History reviewed. No pertinent family history. Scheduled Meds:   stroke: mapping our early stages of recovery book   Does not apply Once   sodium chloride   Intravenous Once   amLODipine  2.5 mg Oral Daily   chlorhexidine  15 mL Mouth Rinse BID   Chlorhexidine Gluconate Cloth  6 each Topical Daily   insulin aspart  0-9 Units Subcutaneous Q4H   lisinopril  40 mg Oral Daily   mouth rinse  15 mL Mouth Rinse q12n4p   mupirocin ointment  1 application Nasal BID   pantoprazole  40 mg Oral QHS   Continuous Infusions:  cefTRIAXone (ROCEPHIN)  IV 1 g (08/16/21 1058)   PRN Meds:.acetaminophen **OR** acetaminophen (TYLENOL) oral liquid 160 mg/5 mL **OR** acetaminophen, hydrALAZINE, labetalol, senna-docusate, sodium chloride Medications Prior to Admission:  Prior to Admission medications   Medication Sig Start Date End Date Taking? Authorizing Provider  amLODipine (NORVASC) 2.5 MG tablet Take 2.5 mg by mouth daily. 06/29/21  Yes [provider]  aspirin EC 81 MG tablet Take 1 tablet (81 mg total) by mouth daily. Swallow whole. Discontinue after 08/10/21 07/20/21  Yes Kathie Dike, MD  cetirizine (ZYRTEC) 10 MG tablet Take 10 mg by mouth daily.   Yes [provider]  clopidogrel (PLAVIX) 75 MG tablet  Take 1 tablet (75 mg total) by mouth daily. 07/21/21  Yes Kathie Dike, MD  fosfomycin (MONUROL) 3 g PACK Take 3 g by mouth every other day. 08/07/21  Yes [provider]  glipiZIDE (GLUCOTROL) 5 MG tablet Take 5 mg by mouth daily. 06/29/21  Yes [provider]  lisinopril (ZESTRIL) 40 MG tablet Take 40 mg by mouth daily. 06/29/21  Yes [provider]  loperamide (IMODIUM A-D) 2 MG tablet Take 2 mg by mouth 4 (four) times daily as needed for diarrhea or loose stools.   Yes [provider]  LORazepam (ATIVAN) 0.5 MG tablet Take 0.5 mg by mouth daily as needed for anxiety. 05/24/21  Yes [provider]  metFORMIN (GLUCOPHAGE) 500 MG tablet Take 500 mg by mouth 2 (two) times daily. 07/03/21  Yes [provider]  [facility] powder Apply 1 application topically in the morning and at bedtime. 06/20/21  Yes [provider]  ondansetron (ZOFRAN-ODT) 4 MG disintegrating tablet Take 1 tablet by mouth every 8 (eight) hours as needed for nausea or vomiting. 07/25/20  Yes [provider]  pantoprazole (PROTONIX) 20 MG tablet Take 20 mg by mouth daily. 06/29/21  Yes [provider]  pravastatin (PRAVACHOL) 20 MG tablet Take 1 tablet (20 mg total) by mouth daily at 6 PM. 07/20/21  Yes Memon, Jolaine Artist, MD  sertraline (ZOLOFT) 50 MG tablet Take 50 mg by mouth daily. 06/29/21  Yes [provider]  TUMS 500 MG chewable tablet Chew 2 tablets by mouth 3 (three) times daily as needed for heartburn or indigestion. 06/23/21  Yes [provider]  Vitamin D, Ergocalciferol, (DRISDOL) 1.25 MG (50000 UNIT) CAPS capsule Take 50,000 Units by mouth once a week. Fridays 06/29/21  Yes [provider]  acetaminophen (TYLENOL) 500 MG tablet Take 500 mg by mouth 2 (two) times daily.    [provider]  nitrofurantoin, macrocrystal-monohydrate, (MACROBID) 100 MG capsule Take 1 capsule (100 mg total) by mouth every 12 (twelve)  hours. Patient not taking: Reported on 08/11/2021 07/20/21   Kathie Dike, MD   Allergies  Allergen Reactions   Sulfa Antibiotics Rash    Other reaction(s): Other (See Comments)    Review of Systems  Unable to perform ROS: Mental status change   Physical Exam Constitutional:      Appearance: She is normal weight. She is ill-appearing.  Cardiovascular:     Rate and Rhythm: Normal rate.  Skin:    General: Skin is warm and dry.  Neurological:     Mental Status: She is lethargic.    Vital Signs: BP (!) 160/56 (BP Location: Right Arm)    Pulse 80    Temp 98.6 F (37 C) (Oral)    Resp 12    Ht 5\' 6"  (1.676 m)    Wt 71.4 kg    SpO2 99%    BMI 25.39 kg/m  Pain Scale: Faces   Pain Score: 0-No pain   SpO2: SpO2: 99 % O2 Device:SpO2: 99 % O2 Flow Rate: .O2 Flow Rate (L/min): 1 L/min  IO: Intake/output summary:  Intake/Output Summary (Last 24 hours) at 08/16/2021 1328 Last data filed at 08/15/2021 1800 Gross per 24 hour  Intake --  Output 250 ml  Net -250 ml    LBM: Last BM Date : 08/13/21 Baseline Weight: Weight: 71.4 kg Most recent weight: Weight: 71.4 kg     Palliative Assessment/Data:    Discussed with attending   Signed by: Wadie Lessen, NP   Please contact Palliative Medicine Team phone at [phone] for questions and concerns.  For individual provider: See Shea Evans

## 2021-08-16 NOTE — Progress Notes (Signed)
Occupational Therapy Treatment Patient Details Name: Julia [name] MRN: [id number] DOB: 03-Oct-1927 Today's Date: 08/16/2021   History of present illness Pt is 88 yo female from ALF where she was able to transfer with assist who was found unresponsive with L gaze after her therapy session. MRI neg for acute changes but showed remote parietal infarct.  PMH: multiple CVA's and TIA's affecting left side, Alzheimers, anxiety, HTN.   OT comments  [name] is making incremental progress. Son present and supportive this session. [name] did not make attempt to actively move either L extremity this session despite multimodial cues. She was max A to transfer to EOB and tolerated sitting for ~15 minutes with close min guard -min A for sitting balance. With cues, pt able to correct L lateral lean in sitting. She opened her eyes with cues, and located grooming items placed to her left. Pleasantly confused throughout. D/c remains appropriate, OT to continue to follow acutely.    Recommendations for follow up therapy are one component of a multi-disciplinary discharge planning process, led by the attending physician.  Recommendations may be updated based on patient status, additional functional criteria and insurance authorization.    Follow Up Recommendations  Skilled nursing-short term rehab (<3 hours/day)    Assistance Recommended at Discharge Frequent or constant Supervision/Assistance  Patient can return home with the following  Two people to help with walking and/or transfers;Two people to help with bathing/dressing/bathroom;Assistance with feeding;Help with stairs or ramp for entrance;Assistance with cooking/housework;Assist for transportation;Direct supervision/assist for financial management;Direct supervision/assist for medications management   Equipment Recommendations  Other (comment)       Precautions / Restrictions Precautions Precautions: Fall Restrictions Weight Bearing Restrictions: No        Mobility Bed Mobility Overal bed mobility: Needs Assistance Bed Mobility: Supine to Sit, Sit to Supine     Supine to sit: Max assist Sit to supine: Mod assist   General bed mobility comments: L side management and to elevate trunk. pt able to control trunk when getting back into bed    Transfers                   General transfer comment: defer     Balance Overall balance assessment: Needs assistance Sitting-balance support: No upper extremity supported, Feet supported Sitting balance-Leahy Scale: Poor Sitting balance - Comments: fair- poor. pt intermittently needed min A for midline balance, able to self correct with cues Postural control: Left lateral lean                 ADL either performed or assessed with clinical judgement   ADL Overall ADL's : Needs assistance/impaired     Grooming: Minimal assistance;Sitting Grooming Details (indicate cue type and reason): cues, washed face with RUE while sitting with cues for initiation and sequencing                     Functional mobility during ADLs: Maximal assistance (bed level) General ADL Comments: improved sitting balance today. max A to transfer from sup>sit. sat ~15 minutes    Extremity/Trunk Assessment Upper Extremity Assessment Upper Extremity Assessment: RUE deficits/detail;LUE deficits/detail RUE Deficits / Details: Can hold up hand and touch chin when asked; for washing face once I placed washcloth in her right hand she needed hand over hand to get started and then only washed right side of face--had to do hand over hand again to move to left side of face--was not thorough for either side. LUE Deficits / Details:  tone from prior stroke, could get fingers and elbow fully extended PROM with increased time for elbow. not attempting to move actively LUE Sensation: decreased proprioception LUE Coordination: decreased fine motor;decreased gross motor   Lower Extremity Assessment Lower Extremity  Assessment: Defer to PT evaluation        Vision   Vision Assessment?: Vision impaired- to be further tested in functional context Additional Comments: pt holding eyes closed the majority of the session, able to locate grooming item on L side when cued   Perception Perception Perception: Not tested   Praxis Praxis Praxis: Not tested    Cognition Arousal/Alertness: Lethargic Behavior During Therapy: Flat affect Overall Cognitive Status: Impaired/Different from baseline             General Comments: eyes closed 50% of the session, would open with heavy cues. able to state her name, unable to state her son's name who was in the room. follows <25% of commands. pleasantly confused              General Comments VSS on RA, son "tim" present    Pertinent Vitals/ Pain       Pain Assessment Pain Assessment: Faces Faces Pain Scale: Hurts a little bit Pain Location: LLE with mvmt Pain Descriptors / Indicators: Grimacing, Tightness Pain Intervention(s): Limited activity within patient's tolerance, Monitored during session   Frequency  Min 2X/week        Progress Toward Goals  OT Goals(current goals can now be found in the care plan section)  Progress towards OT goals: Progressing toward goals  Acute Rehab OT Goals Patient Stated Goal: unable to state OT Goal Formulation: Patient unable to participate in goal setting Time For Goal Achievement: 08/28/21 Potential to Achieve Goals: Fair ADL Goals Pt Will Perform Upper Body Bathing: with mod assist Pt Will Transfer to Toilet: with mod assist;squat pivot transfer;stand pivot transfer;bedside commode Additional ADL Goal #1: Pt will maintain eyes open 50% of session (continue to assess vision) Additional ADL Goal #2: Pt will follow 50% of one step commands.  Plan Discharge plan remains appropriate       AM-PAC OT "6 Clicks" Daily Activity     Outcome Measure   Help from another person eating meals?: A Lot Help from  another person taking care of personal grooming?: A Lot Help from another person toileting, which includes using toliet, bedpan, or urinal?: Total Help from another person bathing (including washing, rinsing, drying)?: A Lot Help from another person to put on and taking off regular upper body clothing?: A Lot Help from another person to put on and taking off regular lower body clothing?: Total 6 Click Score: 10    End of Session Equipment Utilized During Treatment: Gait belt  OT Visit Diagnosis: Unsteadiness on feet (R26.81);Other abnormalities of gait and mobility (R26.89);Muscle weakness (generalized) (M62.81);Other symptoms and signs involving cognitive function;Hemiplegia and hemiparesis Hemiplegia - Right/Left: Left Hemiplegia - dominant/non-dominant: Non-Dominant Hemiplegia - caused by: Cerebral infarction   Activity Tolerance Patient limited by lethargy   Patient Left in bed;with call bell/phone within reach;with bed alarm set;with family/visitor present   Nurse Communication Mobility status        Time: 1114-1140 OT Time Calculation (min): 26 min  Charges: OT General Charges $OT Visit: 1 Visit OT Treatments $Self Care/Home Management : 8-22 mins $Therapeutic Activity: 8-22 mins   Madason Rauls A Tabor Denham 08/16/2021, 12:27 PM

## 2021-08-16 NOTE — Care Management Important Message (Signed)
Important Message  Patient Details  Name: Julia [name] MRN: [id number] Date of Birth: 07-31-1927   Medicare Important Message Given:  Yes     Charlee Whitebread Montine Circle 08/16/2021, 1:38 PM

## 2021-08-16 NOTE — Progress Notes (Signed)
Progress Note  Patient: Julia [name] BHA:[id number] DOB: 1927/12/03  DOA: 08/11/2021  DOS: 08/16/2021    Brief hospital course: Julia [name] is a 88 y.o. Caucasian 88 year old female with history of hypertension, diabetes, Alzheimer disease, stroke and TIA presented to ED for code stroke.  Slow to improve.  Currently not eating.  May need hospice if PO intake does not improve.  Palliative care consulted.  Assessment and Plan: * Stroke (cerebrum) ([place])- (present on admission) -Post TNKase CT 2/10 @ 1458 - Interval acute hemorrhage within the RIGHT thalamus extending into internal capsule measuring 2.7 x 1.9 x 1.6 cm. -Repeat Head CT 2/10 @ 2100- No significant interval change in size of right thalamic intraparenchymal hemorrhage, Minimal localized edema without significant regional mass effect. No intraventricular extension or other complicating features. -CTA head & neck- Multifocal severe stenosis of the right vertebral artery in the neck with occlusion at its dural margin and largely occluded intradurally. Severe distal A3/A4 ACA stenosis bilaterally with intermittent/poor distal opacification. Suspected severe bilateral M2 MCA stenosis, although evaluation is limited by motion. Moderate to severe left P2 PCA stenosis. Small (1-2 mm) outpouching at the right proximal basilar artery -MRI- Bland appearing right thalamic hematoma. Remote parasagittal right parietal cortex infarct. Small remote right cerebellar infarct. remote hypertensive pattern hemorrhages. -2D Echo LVEF 60-65% - LDL-64.9 (at goal) -HgbA1c 5.4 (at goal) - No antithombotic recommended at this time.  DNR (do not resuscitate)- (present on admission) Family not interested in feeding tube. If she continues to not eat would be more residential hospice appropriate than SNF appropriate  - Continue follow up with [employer] as outpatient who can help guide timing of transition to hospice when appropriate.   Acute lower UTI-  (present on admission) - Continue ceftriaxone pending culture data.   Alzheimer's disease ([place])- (present on admission) -from a memory care unit ([facility] in [place])  Essential hypertension- (present on admission) -BP goal less than 160 -PRN IV  Hyperglycemia due to diabetes mellitus ([place])- (present on admission) Well-controlled  Subjective: Confused, sparse language. Son at bedside says when she has encouragement and assistance, she actually ate more beginning late last night. More interactive with therapy today.   Objective: Vitals:   08/15/21 2350 08/16/21 0340 08/16/21 0725 08/16/21 1157  BP: (!) 135/35 (!) 142/38 (!) 145/51 (!) 160/56  Pulse: 74 72 70 80  Resp: 18 18 14 12   Temp: 98 F (36.7 C) 98 F (36.7 C) 97.9 F (36.6 C) 98.6 F (37 C)  TempSrc: Oral Oral Oral Oral  SpO2: 96% 97% 98% 99%  Weight:      Height:       Gen: Elderly female in no distress Pulm: Nonlabored breathing room air. CV: Regular rate and rhythm. No murmur, rub, or gallop. No JVD, no dependent edema. GI: Abdomen soft, non-tender, non-distended, with normoactive bowel sounds.  Ext: Warm, no deformities Skin: No rashes, lesions or ulcers on visualized skin. Neuro: Alert and disoriented, very limited ability to follow commands, left lean. Psych: Judgement and insight appear impaired. Mood euthymic & affect congruent. Behavior is appropriate.    Data Personally reviewed: CBC: Recent Labs  Lab 08/11/21 1157 08/12/21 0201 08/13/21 0504 08/14/21 0552 08/15/21 0342 08/16/21 0347  WBC 9.3 11.0* 5.6 5.3 4.9 4.8  NEUTROABS 7.1  --   --   --   --   --   HGB 11.4* 10.7* 9.2* 8.5* 8.4* 8.9*  HCT 35.5* 31.6* 28.3* 25.4* 25.5* 26.6*  MCV 88.8 86.8 86.0 85.2 85.0  84.7  PLT 236 255 158 183 165 212   Basic Metabolic Panel: Recent Labs  Lab 08/12/21 0201 08/13/21 0504 08/14/21 0552 08/15/21 0342 08/16/21 0347  NA 141 142 140 138 139  K 4.6 3.9 3.6 3.5 3.7  CL 108 111 110 109 109  CO2  19* 21* 21* 20* 21*  GLUCOSE 120* 96 90 110* 123*  BUN 18 14 13 13 17   CREATININE 1.25* 1.00 1.08* 1.02* 1.04*  CALCIUM 9.1 8.8* 8.7* 9.1 9.1   Liver Function Tests: Recent Labs  Lab 08/11/21 1157  AST 25  ALT 20  ALKPHOS 64  BILITOT 0.8  PROT 7.1  ALBUMIN 4.0   Urine analysis:    Component Value Date/Time   COLORURINE YELLOW 07/19/2021 [place] 07/19/2021 1205   LABSPEC >1.030 (H) 07/19/2021 1205   PHURINE 5.5 07/19/2021 [place] 07/19/2021 1205   [place] 07/19/2021 [place] 07/19/2021 1205   KETONESUR NEGATIVE 07/19/2021 1205   PROTEINUR NEGATIVE 07/19/2021 1205   NITRITE POSITIVE (A) 07/19/2021 1205   LEUKOCYTESUR SMALL (A) 07/19/2021 1205   SARS-CoV-2 PCR: Negative Influenza A/B: Negative  Family Communication: Son at bedside  Disposition: Status is: Inpatient Remains inpatient appropriate because: Persistent stroke symptoms limiting oral intake and unable to discharge back to memory care. Pursuing SNF placement pending ability to tolerate adequate oral intake. Alternative would be residential hospice, though this is currently looking less likely at this time.  Planned Discharge Destination: [place], [place] 08/16/2021 2:04 PM Page by [email or web address]

## 2021-08-16 NOTE — TOC Progression Note (Signed)
Transition of Care [facility]) - Progression Note    Patient Details  Name: Julia [name] MRN: [id number] Date of Birth: [birth year]  Transition of Care [facility]) CM/SW [place], [place] Phone Number: 08/16/2021, 1:57 PM  Clinical Narrative:   CSW alerted by MD that per patient's son, if she is fed, she is eating well. Son would like to move forward with SNF placement. CSW contacted [facility], they will have a bed available when medically ready. CSW met with son at bedside to provide update, he was appreciative of update. CSW to continue to follow.    Expected Discharge Plan: [place] Barriers to Discharge: Continued Medical Work up  Expected Discharge Plan and Services Expected Discharge Plan: [place] In-house Referral: Clinical Social Work   Post Acute Care Choice: Resumption of Svcs/PTA Provider Living arrangements for the past 2 months: Assisted Living Facility                                       Social Determinants of Health (SDOH) Interventions    Readmission Risk Interventions Readmission Risk Prevention Plan 08/14/2021  Transportation Screening Complete  PCP or Specialist Appt within 5-7 Days Complete  Home Care Screening Complete  Medication Review (RN CM) Complete  Some recent data might be hidden

## 2021-08-16 NOTE — Progress Notes (Signed)
SLP Cancellation Note  Patient Details Name: Julia [name] MRN: [id number] DOB: [birth year]   Cancelled treatment:       Reason Eval/Treat Not Completed: Patient at procedure or test/unavailable (Pt currently working with OT. SLP will follow up as schedule allows.)  Crixus Mcaulay I. Hardin Negus, [place], [place] Office number [phone] Pager [place] 08/16/2021, 11:36 AM

## 2021-08-17 LAB — GLUCOSE, CAPILLARY
Glucose-Capillary: 128 mg/dL — ABNORMAL HIGH (ref 70–99)
Glucose-Capillary: 121 mg/dL — ABNORMAL HIGH (ref 70–99)
Glucose-Capillary: 149 mg/dL — ABNORMAL HIGH (ref 70–99)
Glucose-Capillary: 120 mg/dL — ABNORMAL HIGH (ref 70–99)
Glucose-Capillary: 112 mg/dL — ABNORMAL HIGH (ref 70–99)

## 2021-08-17 MED ORDER — AMLODIPINE BESYLATE 5 MG PO TABS: 5.0000 mg | ORAL_TABLET | Freq: Every day | ORAL | Status: AC

## 2021-08-17 NOTE — Progress Notes (Addendum)
Patient ID: Julia [name], female   DOB: May 31, 1928, 88 y.o.   MRN: [id number]    Progress Note from the Palliative Medicine Team at [facility]   Patient Name: Julia [name]        Date: 08/17/2021 DOB: [birth year]  Age: 88 y.o. MRN#: [id number] Attending Physician: Patrecia Pour, MD Primary Care Physician: Glenda Chroman, MD Admit Date: 08/11/2021   Medical records reviewed, labs and progress notes  88 y.o. female   admitted on 08/11/2021  with history of hypertension, diabetes, Alzheimer disease, stroke and TIA presented to ED for code stroke.   Patient with continued slow physical, functional and cognitive decline over the past several years.  This decline started after stroke approximately 2 years ago, attempt at rehab at [facility] and then need for continued 24-hour care/[facility].  Patient was seen outpatient by community-based palliative services/[facility]   This NP visited patient at the bedside as a follow up to  yesterday's [place].  Patient is more awake today, limited interaction with this provider.  Continues with decreased oral intake.  I spoke to patient's son/Tim [place] by telephone at his request.     Plan was to meet tomorrow to complete MOST form however patient will be discharged in the morning to [facility].  I informed the family that I will reach out to a Furniture conservator/restorer to ensure a follow-up visit for continued conversation regarding goals of care and completion of documents.  Education offered on the natural trajectory of end-stage dementia specific to decreased oral intake.     Family has verbalize no desire for artificial feeding now or in the future.   Continued education regarding concept specific to adult failure to thrive and the limitations of medical interventions to preserve quality of life when the body fails to thrive.  Again education offered on the importance of continued conversation within the  family and the  medical  providers regarding overall plan of care and treatment options,  ensuring decisions are within the context of the patients values and GOCs.  Questions and concerns addressed   Discussed with attending physician and bedside RN  Total time spent on the unit was 50 minutes.  Greater than 50 percent of the time was spent in counseling coordination of care.   Wadie Lessen NP  Palliative Medicine Team Team Phone # [phone] Pager [phone]

## 2021-08-17 NOTE — Progress Notes (Signed)
Patient awaiting transport via [facility] requests a call at one of the following numbers when patient is picked up so that they can meet the patient at the facility   Octavia Bruckner [phone]  Daughter in law [name] [phone]

## 2021-08-17 NOTE — Plan of Care (Signed)
°  Problem: Ischemic Stroke/TIA Tissue Perfusion: Goal: Complications of ischemic stroke/TIA will be minimized Outcome: Progressing   Problem: Intracerebral Hemorrhage Tissue Perfusion: Goal: Complications of Intracerebral Hemorrhage will be minimized Outcome: Progressing   Problem: Self-Care: Goal: Ability to communicate needs accurately will improve Outcome: Progressing   Problem: Self-Care: Goal: Ability to participate in self-care as condition permits will improve Outcome: Progressing

## 2021-08-17 NOTE — TOC Transition Note (Signed)
Transition of Care [facility]) - CM/SW Discharge Note   Patient Details  Name: Julia [name] MRN: [id number] Date of Birth: 1928/02/10  Transition of Care [facility]) CM/SW Contact:  Geralynn Ochs, LCSW Phone Number: 08/17/2021, 1:17 PM   Clinical Narrative:   CSW sent discharge summary to [facility], confirmed bed availability. [place] to set up palliative care upon patient arrival. No family at bedside, RN indicates family will be back shortly. Transport scheduled with [facility] for next available.   Nurse to call report to [phone].    Final next level of care: Skilled Nursing Facility Barriers to Discharge: Barriers Resolved   Patient Goals and CMS Choice Patient states their goals for this hospitalization and ongoing recovery are:: Rehab CMS Medicare.gov Compare Post Acute Care list provided to:: Patient Represenative (must comment) Choice offered to / list presented to : Adult Children  Discharge Placement              Patient chooses bed at: [facility] Patient to be transferred to facility by: [place] Name of family member notified: Tim Patient and family notified of of transfer: 08/17/21  Discharge Plan and Services In-house Referral: Clinical Social Work   Post Acute Care Choice: Resumption of Svcs/PTA Provider                               Social Determinants of Health (SDOH) Interventions     Readmission Risk Interventions Readmission Risk Prevention Plan 08/14/2021  Transportation Screening Complete  PCP or Specialist Appt within 5-7 Days Complete  Home Care Screening Complete  Medication Review (RN CM) Complete  Some recent data might be hidden

## 2021-08-17 NOTE — Plan of Care (Signed)
°  Problem: Education: Goal: Knowledge of disease or condition will improve Outcome: Adequate for Discharge Goal: Knowledge of secondary prevention will improve (SELECT ALL) Outcome: Adequate for Discharge Goal: Knowledge of patient specific risk factors will improve (INDIVIDUALIZE FOR PATIENT) Outcome: Adequate for Discharge   Problem: Coping: Goal: Will verbalize positive feelings about self Outcome: Adequate for Discharge   Problem: Self-Care: Goal: Ability to participate in self-care as condition permits will improve Outcome: Adequate for Discharge Goal: Ability to communicate needs accurately will improve Outcome: Adequate for Discharge   Problem: Intracerebral Hemorrhage Tissue Perfusion: Goal: Complications of Intracerebral Hemorrhage will be minimized Outcome: Adequate for Discharge   Problem: Ischemic Stroke/TIA Tissue Perfusion: Goal: Complications of ischemic stroke/TIA will be minimized Outcome: Adequate for Discharge   Problem: Education: Goal: Knowledge of General Education information will improve Description: Including pain rating scale, medication(s)/side effects and non-pharmacologic comfort measures Outcome: Adequate for Discharge   Problem: Health Behavior/Discharge Planning: Goal: Ability to manage health-related needs will improve Outcome: Adequate for Discharge   Problem: Clinical Measurements: Goal: Ability to maintain clinical measurements within normal limits will improve Outcome: Adequate for Discharge Goal: Will remain free from infection Outcome: Adequate for Discharge Goal: Diagnostic test results will improve Outcome: Adequate for Discharge Goal: Respiratory complications will improve Outcome: Adequate for Discharge Goal: Cardiovascular complication will be avoided Outcome: Adequate for Discharge   Problem: Activity: Goal: Risk for activity intolerance will decrease Outcome: Adequate for Discharge   Problem: Nutrition: Goal: Adequate  nutrition will be maintained Outcome: Adequate for Discharge   Problem: Coping: Goal: Level of anxiety will decrease Outcome: Adequate for Discharge   Problem: Elimination: Goal: Will not experience complications related to bowel motility Outcome: Adequate for Discharge Goal: Will not experience complications related to urinary retention Outcome: Adequate for Discharge   Problem: Pain Managment: Goal: General experience of comfort will improve Outcome: Adequate for Discharge   Problem: Safety: Goal: Ability to remain free from injury will improve Outcome: Adequate for Discharge   Problem: Skin Integrity: Goal: Risk for impaired skin integrity will decrease Outcome: Adequate for Discharge

## 2021-08-17 NOTE — Discharge Summary (Addendum)
Physician Discharge Summary   Patient: Julia [name] MRN: [id number] DOB: 1927/07/25  Admit date:     08/11/2021  Discharge date: 08/17/21  Discharge Physician: Patrecia Pour   PCP: Glenda Chroman, MD   Recommendations at discharge:  Continue palliative care following at SNF. If pt's oral intake declines, would suggest hospice care. If goals remain aggressive care, please call [facility] to schedule follow up with stroke neurology in approximately 6-8 weeks.  Discharge Diagnoses: Principal Problem:   Stroke (cerebrum) ([place]) Active Problems:   Hyperglycemia due to diabetes mellitus ([place])   Essential hypertension   Alzheimer's disease ([place])   Acute lower UTI   DNR (do not resuscitate)  Hospital Course: Julia [name] is a 88 y.o. female with history of hypertension, diabetes, Alzheimer disease, stroke and TIA who presented to ED from memory care for code stroke due to being poorly responsive, nonverbal with leftward gaze deviation.  Code stroke activated.  BP with EMS 170/70, glucose 101.  CT no acute abnormality, questionable slightly decreased right thalamic hypodensity. TNK was given after discussion with the patient's daughter. The patient suffered right thalamic ICH post TNK, admitted on cleviprex gtt, given TXA and cryo. Deficits have remained stable with stable imaging as delineated below. Antiplatelet agents were stopped. The patient will require SNF at discharge, recommend palliative care following.  Assessment and Plan: * Stroke (cerebrum) ([place])- (present on admission) -Post TNKase CT 2/10 @ 1458 - Interval acute hemorrhage within the RIGHT thalamus extending into internal capsule measuring 2.7 x 1.9 x 1.6 cm. -Repeat Head CT 2/10 @ 2100- No significant interval change in size of right thalamic intraparenchymal hemorrhage, Minimal localized edema without significant regional mass effect. No intraventricular extension or other complicating features. -CTA head &  neck- Multifocal severe stenosis of the right vertebral artery in the neck with occlusion at its dural margin and largely occluded intradurally. Severe distal A3/A4 ACA stenosis bilaterally with intermittent/poor distal opacification. Suspected severe bilateral M2 MCA stenosis, although evaluation is limited by motion. Moderate to severe left P2 PCA stenosis. Small (1-2 mm) outpouching at the right proximal basilar artery -MRI- Bland appearing right thalamic hematoma. Remote parasagittal right parietal cortex infarct. Small remote right cerebellar infarct. remote hypertensive pattern hemorrhages. -2D Echo LVEF 60-65% - LDL-64.9 (at goal) -HgbA1c 5.4 (at goal) - No antithombotic recommended at this time. Stopped aspirin and plavix.  DNR (do not resuscitate)- (present on admission): Reportedly diminishing functional status for past 2 years.  - Pt currently able to maintain nutritional/hydration needs by mouth. Family not interested in feeding tube. If she continues to not eat would be more residential hospice appropriate than SNF appropriate. Continue follow up with [facility] as outpatient who can help guide timing of transition to hospice when appropriate.   Acute lower UTI- (present on admission) - Received 3 doses of ceftriaxone. Remains afebrile without leukocytosis. Apparently is on chronic suppressive abx which is not changed at discharge.  Alzheimer's disease ([place])- (present on admission) -from a memory care unit ([facility] in [place])  Essential hypertension- (present on admission) -BP goal less than 160, so increased norvasc dose to 5mg , continue lisinopril 40mg  daily.  Hyperglycemia due to diabetes mellitus ([place])- (present on admission) Well-controlled. Consider discontinuing glipizide if CBGs are low.  Consultants: Neurology Procedures performed: Echo, TNK  Disposition: Skilled nursing facility Diet recommendation:  Dysphagia type 3 Thin Liquid  DISCHARGE  MEDICATION: Allergies as of 08/17/2021       Reactions   Sulfa Antibiotics Rash  Other reaction(s): Other (See Comments)        Medication List     STOP taking these medications    aspirin EC 81 MG tablet   clopidogrel 75 MG tablet Commonly known as: PLAVIX   nitrofurantoin (macrocrystal-monohydrate) 100 MG capsule Commonly known as: MACROBID       TAKE these medications    acetaminophen 500 MG tablet Commonly known as: TYLENOL Take 500 mg by mouth 2 (two) times daily.   amLODipine 5 MG tablet Commonly known as: NORVASC Take 1 tablet (5 mg total) by mouth daily. What changed:  medication strength how much to take   cetirizine 10 MG tablet Commonly known as: ZYRTEC Take 10 mg by mouth daily.   fosfomycin 3 g Pack Commonly known as: MONUROL Take 3 g by mouth every other day.   glipiZIDE 5 MG tablet Commonly known as: GLUCOTROL Take 5 mg by mouth daily.   lisinopril 40 MG tablet Commonly known as: ZESTRIL Take 40 mg by mouth daily.   loperamide 2 MG tablet Commonly known as: IMODIUM A-D Take 2 mg by mouth 4 (four) times daily as needed for diarrhea or loose stools.   LORazepam 0.5 MG tablet Commonly known as: ATIVAN Take 0.5 mg by mouth daily as needed for anxiety.   metFORMIN 500 MG tablet Commonly known as: GLUCOPHAGE Take 500 mg by mouth 2 (two) times daily.   Nyamyc powder Generic drug: nystatin Apply 1 application topically in the morning and at bedtime.   ondansetron 4 MG disintegrating tablet Commonly known as: ZOFRAN-ODT Take 1 tablet by mouth every 8 (eight) hours as needed for nausea or vomiting.   pantoprazole 20 MG tablet Commonly known as: PROTONIX Take 20 mg by mouth daily.   pravastatin 20 MG tablet Commonly known as: PRAVACHOL Take 1 tablet (20 mg total) by mouth daily at 6 PM.   sertraline 50 MG tablet Commonly known as: ZOLOFT Take 50 mg by mouth daily.   Tums 500 MG chewable tablet Generic drug: calcium  carbonate Chew 2 tablets by mouth 3 (three) times daily as needed for heartburn or indigestion.   Vitamin D (Ergocalciferol) 1.25 MG (50000 UNIT) Caps capsule Commonly known as: DRISDOL Take 50,000 Units by mouth once a week. Fridays        Contact information for follow-up providers     Vyas, Rennis Petty B, MD Follow up.   Specialty: Internal Medicine Contact information: [place] [address] [phone]         Garvin Fila, MD Follow up in 2 month(s).   Specialties: Neurology, Radiology Contact information: [address] [place] [address] [phone]              Contact information for after-discharge care     [facility] .   Service: Skilled Nursing Contact information: [address] [place] [address] [phone]                    Subjective: No events overnight Discharge Exam: Filed Weights   08/11/21 1214  Weight: 71.4 kg  No new deficits noted.  Condition at discharge: stable  The results of significant diagnostics from this hospitalization (including imaging, microbiology, ancillary and laboratory) are listed below for reference.   Imaging Studies: CT ANGIO HEAD NECK W WO CM  Result Date: 08/11/2021 CLINICAL DATA:  Stroke/TIA, determine embolic source EXAM: CT ANGIOGRAPHY HEAD AND NECK TECHNIQUE:  Multidetector CT imaging of the head and neck was performed using the standard protocol during bolus administration of intravenous contrast. Multiplanar CT image reconstructions and MIPs were obtained to evaluate the vascular anatomy. Carotid stenosis measurements (when applicable) are obtained utilizing NASCET criteria, using the distal internal carotid diameter as the denominator. RADIATION DOSE REDUCTION: This exam was performed according to the departmental dose-optimization program which includes automated exposure control, adjustment of the mA and/or  kV according to patient size and/or use of iterative reconstruction technique. CONTRAST:  65mL OMNIPAQUE IOHEXOL 350 MG/ML SOLN COMPARISON:  MRA 07/19/2021.  Same day CT code stroke. FINDINGS: CTA NECK FINDINGS Aortic arch: Atherosclerosis of the great vessel origins, which are patent. Right carotid system: Patent. Atherosclerosis at the carotid bifurcation with approximately 50% stenosis of the proximal ICA. Left carotid system: Atherosclerosis at the carotid bifurcation with approximately 40% stenosis of the ICA origin. Vertebral arteries: Multifocal severe atherosclerosis the right V2 vertebral artery, nearly occlusive also, multifocal severe stenosis of the right V3 vertebral artery with occlusion of the vertebral artery at its dural margin. Left vertebral artery is patent without hemodynamically significant stenosis. Skeleton: Moderate to severe multilevel degenerative change. Other neck: No evidence of acute abnormality on limited assessment. Multiple thyroid nodules, measuring up to 9 mm and poorly evaluated due to motion. No follow-up imaging recommended (ref: J Am Coll Radiol. 2015 Feb;12(2): 143-50). Upper chest: Approximately 1.8 cm ground-glass opacity in the imaged right upper lobe and 4 mm area consolidation. Review of the MIP images confirms the above findings CTA HEAD FINDINGS Anterior circulation: Bilateral intracranial ICAs are patent with mild to moderate bilateral paraclinoid stenosis. Bilateral M1 MCAs are patent with mild narrowing of the distal left M1 MCA. Motion limited evaluation of the more distal MCA branches with multifocal severe right M2 MCA stenosis. Also, suspected severe stenosis of a small left M2 MCA branch anteriorly (for example series 6, image 92 small right A1 ACA, likely in part congenital given prominent left A1 ACA. Moderate left A2 ACA stenosis. Severe distal A3/A4 ACA stenosis bilaterally with intermittent/poor distal opacification. Posterior circulation: The intradural  right vertebral artery is largely non-opacified with minimal irregular opacification. Intradural left vertebral artery is patent. Small (1-2 mm) outpouching at the right proximal basilar artery may represent retrograde flow into the occluded right V4 vertebral artery versus aneurysm. Basilar artery is patent with mild atherosclerotic narrowing. Bilateral PCAs are patent with moderate to severe P2 PCA stenosis on the left. Venous sinuses: As permitted by contrast timing, patent. Review of the MIP images confirms the above findings IMPRESSION: 1. Multifocal severe stenosis of the right vertebral artery in the neck with occlusion at its dural margin and largely occluded intradurally. 2. Severe distal A3/A4 ACA stenosis bilaterally with intermittent/poor distal opacification. 3. Suspected severe bilateral M2 MCA stenosis, although evaluation is limited by motion. 4. Mild-to-moderate bilateral paraclinoid ICA stenosis. 5. Moderate to severe left P2 PCA stenosis. 6. Bilateral carotid bifurcation with proximally 50% right and 40% left proximal ICA stenosis. 7. Small (1-2 mm) outpouching at the right proximal basilar artery is suspicious for an aneurysm, although retrograde flow into the occluded distal right vertebral artery is a differential consideration. 8. Approximately 1.8 cm ground-glass opacity in the imaged right upper lobe and 4 mm area consolidation. These areas may be infectious/inflammatory; however, recommend follow-up CT chest in 3-6 months to ensure resolution and exclude malignancy. If areas persist, subsequent management will be based upon the most suspicious nodule(s). This recommendation follows the consensus statement:  Guidelines for Management of Incidental Pulmonary Nodules Detected on CT Images: From the Fleischner Society 2017; Radiology 2017; 284:228-243. Electronically Signed   By: Margaretha Sheffield M.D.   On: 08/11/2021 13:39   CT HEAD WO CONTRAST (5MM)  Result Date: 08/11/2021 CLINICAL DATA:   Follow-up examination for stroke. EXAM: CT HEAD WITHOUT CONTRAST TECHNIQUE: Contiguous axial images were obtained from the base of the skull through the vertex without intravenous contrast. RADIATION DOSE REDUCTION: This exam was performed according to the departmental dose-optimization program which includes automated exposure control, adjustment of the mA and/or kV according to patient size and/or use of iterative reconstruction technique. COMPARISON:  CT from earlier the same day. FINDINGS: Brain: Previously identified intraparenchymal hemorrhage centered at the right thalamus again seen, measuring 2.4 x 1.9 x 1.5 cm (estimated volume 3.5 mL). This is relatively similar to previous. Minimal localized edema without significant regional mass effect. No intraventricular extension or other complicating features. No midline shift. No other acute intracranial hemorrhage or large vessel territory infarct. Underlying atrophy with chronic microvascular ischemic disease again noted. Chronic posterior right ACA distribution infarct noted as well. No mass lesion or extra-axial fluid collection. Vascular: Calcified atherosclerosis present at the skull base. Probable small lateral residual contrast within the intracranial vasculature. No convincing new hyperdense vessel. Skull: Scalp soft tissues and calvarium demonstrate no new finding. Sinuses/Orbits: Globes orbital soft tissues demonstrate no acute finding. Mild mucosal thickening noted within the ethmoidal air cells. Paranasal sinuses are otherwise clear. No mastoid effusion. Other: None. IMPRESSION: 1. No significant interval change in size of right thalamic intraparenchymal hemorrhage, measuring 2.4 x 1.9 x 1.5 cm (estimated volume 3.5 mL). Minimal localized edema without significant regional mass effect. No intraventricular extension or other complicating features. 2. No other new acute intracranial abnormality. Electronically Signed   By: Jeannine Boga M.D.    On: 08/11/2021 21:45   CT Head Wo Contrast  Result Date: 08/11/2021 CLINICAL DATA:  Follow-up stroke, less responsive, EXAM: CT HEAD WITHOUT CONTRAST TECHNIQUE: Contiguous axial images were obtained from the base of the skull through the vertex without intravenous contrast. RADIATION DOSE REDUCTION: This exam was performed according to the departmental dose-optimization program which includes automated exposure control, adjustment of the mA and/or kV according to patient size and/or use of iterative reconstruction technique. COMPARISON:  Earlier exam of 08/11/2021 FINDINGS: Brain: Interval acute hemorrhage within the RIGHT thalamus extending into internal capsule, area of hemorrhage measuring 2.7 x 1.9 x 1.6 cm. Underlying atrophy and small vessel chronic ischemic changes. No additional hemorrhage or infarct. No extra-axial fluid collections. No midline shift or significant mass effect. Vascular: Atherosclerotic calcification of internal carotid arteries at skull base mild residual contrast within the vessels from earlier CTA. Skull: Intact, demineralized Sinuses/Orbits: Clear Other: N/A IMPRESSION: Interval acute hemorrhage within the RIGHT thalamus extending into internal capsule measuring 2.7 x 1.9 x 1.6 cm. Atrophy with small vessel chronic ischemic changes. Critical Value/emergent results were discussed with Dr. Roderic Palau on 08/11/2021 at 1453 hours. Electronically Signed   By: Lavonia Dana M.D.   On: 08/11/2021 14:58   MR ANGIO HEAD WO CONTRAST  Result Date: 07/19/2021 CLINICAL DATA:  Weakness and confusion for 3 days, stroke suspected EXAM: MRI HEAD WITHOUT CONTRAST MRA HEAD WITHOUT CONTRAST MRA NECK WITHOUT AND WITH CONTRAST TECHNIQUE: Multiplanar, multi-echo pulse sequences of the brain and surrounding structures were acquired without intravenous contrast. Angiographic images of the Circle of Willis were acquired using MRA technique without intravenous contrast. Angiographic images of the neck  were  acquired using MRA technique without and with intravenous contrast. Carotid stenosis measurements (when applicable) are obtained utilizing NASCET criteria, using the distal internal carotid diameter as the denominator. CONTRAST:  1mL GADAVIST GADOBUTROL 1 MMOL/ML IV SOLN COMPARISON:  MRI head 08/15/2018, correlation is also made with CT head 07/18/2021. No prior MRA FINDINGS: MRI HEAD FINDINGS Brain: No restricted diffusion to suggest acute or subacute infarct. No acute hemorrhage, mass, mass effect, or midline shift. Area of susceptibility in the right frontal lobe may be related to a prior infarct. Focus of susceptibility in the left basal ganglia and thalamus, likely related to prior hypertensive microhemorrhages. No hydrocephalus or extra-axial collection. Confluent T2 hyperintense signal in the periventricular white matter and pons, likely the sequela of moderate to severe chronic small vessel ischemic disease, which has progressed from the prior exam. Encephalomalacia in the medial right parietal lobe (series 13, image 38 and series 12, image 28), likely related to remote parietal cortical infarct, although this appears new compared to 2020. Vascular: Please see MRA findings below. Skull and upper cervical spine: Normal marrow signal. Sinuses/Orbits: No acute or significant finding. Status post bilateral lens replacements. Other: The mastoids are well aerated. MRA HEAD FINDINGS Anterior circulation: Both internal carotid arteries are patent to the termini, with mild irregularity but without significant stenosis. Hypoplastic right A1 with severe focal stenosis distally (series 1, image 101). Mild narrowing in a patent left A1. Normal anterior communicating artery. Multifocal narrowing in the left ACA, with severe stenosis in the A2 segment (series 1, image 109), and complete non opacification of the more distal left ACA (series 1, images 160-166) . Mild narrowing in the proximal right A2 (series 1, image 107),  with significantly diminished opacification of the distal right ACA (series 1, image 163) and mild multifocal narrowing in the distal ACA branches (series 1, image 163). Mild narrowing in the distal left M1 (series 100, image 102) and in the proximal inferior left M2 (series 1, image 99). Poor perfusion of additional branches of the inferior left M2, with intermittent opacification. Distal left MCA branches are irregular but otherwise perfused. Irregularity without focal stenosis in the right M1. Multifocal narrowing in the M2 and more distal right MCA branches, with focal poor opacification of some M3 branches (for example series 1, images 105-116). Posterior circulation: The left V4 segment and left PICA origin are patent. The right V4 is poorly opacified proximally and not opacified distally, with some retrograde flow in the most distal aspect of the right V4. A prominent right AICA is visualized. Basilar somewhat irregular but patent to its distal aspect. Superior cerebellar arteries patent proximally. Bilateral P1 segments originate from the basilar artery. The left posterior communicating artery is visualized, although there appears to be significant stenosis in the midportion of the vessel, with poor signal. PCAs with multifocal irregularity, as well as focal stenosis in the left distal P2 (series 1, image 84) and proximal P3 segment (series 1, images 92-96). Poor visualization of the P3s bilaterally. Anatomic variants: None significant MRA NECK FINDINGS Aortic arch: Three-vessel arch without significant stenosis. No dissection or aneurysm. Right carotid system: Mild narrowing at the bifurcation, without hemodynamically significant stenosis. No evidence of occlusion or aneurysm. Left carotid system: Mild narrowing at the bifurcation without hemodynamically significant stenosis. No evidence of occlusion or aneurysm. Vertebral arteries: Normal left vertebral artery, which is patent from its origin to the  vertebrobasilar junction, without significant stenosis, occlusion, or aneurysm. Multifocal narrowing of the right V1, which appears  patent, with intermittent non opacification of the right V2 (for example series 17, images 46-48). Intermittent opacification is also noted in the V3 and V4 segments. Other: None IMPRESSION: 1. No acute infarct. Sequela of chronic small vessel disease, with a remote right parietal cortical infarct that is new compared to 2020. 2. Intermittent non opacification of the right V2 and V3 segments, with non opacification of the majority of the right V4 segment, likely secondary to multifocal atherosclerotic narrowing. No other hemodynamically significant stenosis in the neck. 3. No other intracranial proximal large vessel occlusion, however there is multifocal intracranial atherosclerotic narrowing, with focal non opacification of the distal ACAs. Additional severe stenosis or poor perfusion is noted in the distal right A1, proximal left A2, left M2, distal left P2, bilateral distal MCAs and PCAs, and left posterior communicating artery. Electronically Signed   By: Merilyn Baba M.D.   On: 07/19/2021 13:27   MR ANGIO NECK W WO CONTRAST  Result Date: 07/19/2021 CLINICAL DATA:  Weakness and confusion for 3 days, stroke suspected EXAM: MRI HEAD WITHOUT CONTRAST MRA HEAD WITHOUT CONTRAST MRA NECK WITHOUT AND WITH CONTRAST TECHNIQUE: Multiplanar, multi-echo pulse sequences of the brain and surrounding structures were acquired without intravenous contrast. Angiographic images of the Circle of Willis were acquired using MRA technique without intravenous contrast. Angiographic images of the neck were acquired using MRA technique without and with intravenous contrast. Carotid stenosis measurements (when applicable) are obtained utilizing NASCET criteria, using the distal internal carotid diameter as the denominator. CONTRAST:  53mL GADAVIST GADOBUTROL 1 MMOL/ML IV SOLN COMPARISON:  MRI head  08/15/2018, correlation is also made with CT head 07/18/2021. No prior MRA FINDINGS: MRI HEAD FINDINGS Brain: No restricted diffusion to suggest acute or subacute infarct. No acute hemorrhage, mass, mass effect, or midline shift. Area of susceptibility in the right frontal lobe may be related to a prior infarct. Focus of susceptibility in the left basal ganglia and thalamus, likely related to prior hypertensive microhemorrhages. No hydrocephalus or extra-axial collection. Confluent T2 hyperintense signal in the periventricular white matter and pons, likely the sequela of moderate to severe chronic small vessel ischemic disease, which has progressed from the prior exam. Encephalomalacia in the medial right parietal lobe (series 13, image 38 and series 12, image 28), likely related to remote parietal cortical infarct, although this appears new compared to 2020. Vascular: Please see MRA findings below. Skull and upper cervical spine: Normal marrow signal. Sinuses/Orbits: No acute or significant finding. Status post bilateral lens replacements. Other: The mastoids are well aerated. MRA HEAD FINDINGS Anterior circulation: Both internal carotid arteries are patent to the termini, with mild irregularity but without significant stenosis. Hypoplastic right A1 with severe focal stenosis distally (series 1, image 101). Mild narrowing in a patent left A1. Normal anterior communicating artery. Multifocal narrowing in the left ACA, with severe stenosis in the A2 segment (series 1, image 109), and complete non opacification of the more distal left ACA (series 1, images 160-166) . Mild narrowing in the proximal right A2 (series 1, image 107), with significantly diminished opacification of the distal right ACA (series 1, image 163) and mild multifocal narrowing in the distal ACA branches (series 1, image 163). Mild narrowing in the distal left M1 (series 100, image 102) and in the proximal inferior left M2 (series 1, image 99). Poor  perfusion of additional branches of the inferior left M2, with intermittent opacification. Distal left MCA branches are irregular but otherwise perfused. Irregularity without focal stenosis in the right  M1. Multifocal narrowing in the M2 and more distal right MCA branches, with focal poor opacification of some M3 branches (for example series 1, images 105-116). Posterior circulation: The left V4 segment and left PICA origin are patent. The right V4 is poorly opacified proximally and not opacified distally, with some retrograde flow in the most distal aspect of the right V4. A prominent right AICA is visualized. Basilar somewhat irregular but patent to its distal aspect. Superior cerebellar arteries patent proximally. Bilateral P1 segments originate from the basilar artery. The left posterior communicating artery is visualized, although there appears to be significant stenosis in the midportion of the vessel, with poor signal. PCAs with multifocal irregularity, as well as focal stenosis in the left distal P2 (series 1, image 84) and proximal P3 segment (series 1, images 92-96). Poor visualization of the P3s bilaterally. Anatomic variants: None significant MRA NECK FINDINGS Aortic arch: Three-vessel arch without significant stenosis. No dissection or aneurysm. Right carotid system: Mild narrowing at the bifurcation, without hemodynamically significant stenosis. No evidence of occlusion or aneurysm. Left carotid system: Mild narrowing at the bifurcation without hemodynamically significant stenosis. No evidence of occlusion or aneurysm. Vertebral arteries: Normal left vertebral artery, which is patent from its origin to the vertebrobasilar junction, without significant stenosis, occlusion, or aneurysm. Multifocal narrowing of the right V1, which appears patent, with intermittent non opacification of the right V2 (for example series 17, images 46-48). Intermittent opacification is also noted in the V3 and V4 segments.  Other: None IMPRESSION: 1. No acute infarct. Sequela of chronic small vessel disease, with a remote right parietal cortical infarct that is new compared to 2020. 2. Intermittent non opacification of the right V2 and V3 segments, with non opacification of the majority of the right V4 segment, likely secondary to multifocal atherosclerotic narrowing. No other hemodynamically significant stenosis in the neck. 3. No other intracranial proximal large vessel occlusion, however there is multifocal intracranial atherosclerotic narrowing, with focal non opacification of the distal ACAs. Additional severe stenosis or poor perfusion is noted in the distal right A1, proximal left A2, left M2, distal left P2, bilateral distal MCAs and PCAs, and left posterior communicating artery. Electronically Signed   By: Merilyn Baba M.D.   On: 07/19/2021 13:27   MR BRAIN WO CONTRAST  Result Date: 08/12/2021 CLINICAL DATA:  Stroke follow-up EXAM: MRI HEAD WITHOUT CONTRAST TECHNIQUE: Multiplanar, multiecho pulse sequences of the brain and surrounding structures were obtained without intravenous contrast. COMPARISON:  Head CT from yesterday FINDINGS: Brain: Known acute hematoma in the medial and anterior right thalamus, unchanged when allowing for modality differences. Rim of diffusion hyperintensity is attributed to susceptibility. No evidence of regional infarction or mass. No intraventricular decompression or hydrocephalus. Chronic small vessel ischemia which is confluent in the cerebral white matter and pons. Remote parasagittal right parietal cortex infarct. Small remote right cerebellar infarct. Generalized brain atrophy in keeping with age. Chronic microhemorrhages in the deep gray and pons primarily, attributed to chronic small vessel ischemia. Mild superficial siderosis near the right cerebral cortex infarct. Vascular: Absent right vertebral flow void correlating with prior CTA. Skull and upper cervical spine: Normal marrow  signal. Cervical spine degeneration with C2-3 facet ankylosis on the left. Sinuses/Orbits: No acute finding.  Bilateral cataract resection IMPRESSION: Criss Rosales appearing right thalamic hematoma. There is a background of advanced chronic small vessel disease and remote hypertensive pattern hemorrhages. Electronically Signed   By: Jorje Guild M.D.   On: 08/12/2021 06:21   MR BRAIN WO CONTRAST  Result Date: 07/19/2021 CLINICAL DATA:  Weakness and confusion for 3 days, stroke suspected EXAM: MRI HEAD WITHOUT CONTRAST MRA HEAD WITHOUT CONTRAST MRA NECK WITHOUT AND WITH CONTRAST TECHNIQUE: Multiplanar, multi-echo pulse sequences of the brain and surrounding structures were acquired without intravenous contrast. Angiographic images of the Circle of Willis were acquired using MRA technique without intravenous contrast. Angiographic images of the neck were acquired using MRA technique without and with intravenous contrast. Carotid stenosis measurements (when applicable) are obtained utilizing NASCET criteria, using the distal internal carotid diameter as the denominator. CONTRAST:  61mL GADAVIST GADOBUTROL 1 MMOL/ML IV SOLN COMPARISON:  MRI head 08/15/2018, correlation is also made with CT head 07/18/2021. No prior MRA FINDINGS: MRI HEAD FINDINGS Brain: No restricted diffusion to suggest acute or subacute infarct. No acute hemorrhage, mass, mass effect, or midline shift. Area of susceptibility in the right frontal lobe may be related to a prior infarct. Focus of susceptibility in the left basal ganglia and thalamus, likely related to prior hypertensive microhemorrhages. No hydrocephalus or extra-axial collection. Confluent T2 hyperintense signal in the periventricular white matter and pons, likely the sequela of moderate to severe chronic small vessel ischemic disease, which has progressed from the prior exam. Encephalomalacia in the medial right parietal lobe (series 13, image 38 and series 12, image 28), likely related  to remote parietal cortical infarct, although this appears new compared to 2020. Vascular: Please see MRA findings below. Skull and upper cervical spine: Normal marrow signal. Sinuses/Orbits: No acute or significant finding. Status post bilateral lens replacements. Other: The mastoids are well aerated. MRA HEAD FINDINGS Anterior circulation: Both internal carotid arteries are patent to the termini, with mild irregularity but without significant stenosis. Hypoplastic right A1 with severe focal stenosis distally (series 1, image 101). Mild narrowing in a patent left A1. Normal anterior communicating artery. Multifocal narrowing in the left ACA, with severe stenosis in the A2 segment (series 1, image 109), and complete non opacification of the more distal left ACA (series 1, images 160-166) . Mild narrowing in the proximal right A2 (series 1, image 107), with significantly diminished opacification of the distal right ACA (series 1, image 163) and mild multifocal narrowing in the distal ACA branches (series 1, image 163). Mild narrowing in the distal left M1 (series 100, image 102) and in the proximal inferior left M2 (series 1, image 99). Poor perfusion of additional branches of the inferior left M2, with intermittent opacification. Distal left MCA branches are irregular but otherwise perfused. Irregularity without focal stenosis in the right M1. Multifocal narrowing in the M2 and more distal right MCA branches, with focal poor opacification of some M3 branches (for example series 1, images 105-116). Posterior circulation: The left V4 segment and left PICA origin are patent. The right V4 is poorly opacified proximally and not opacified distally, with some retrograde flow in the most distal aspect of the right V4. A prominent right AICA is visualized. Basilar somewhat irregular but patent to its distal aspect. Superior cerebellar arteries patent proximally. Bilateral P1 segments originate from the basilar artery. The  left posterior communicating artery is visualized, although there appears to be significant stenosis in the midportion of the vessel, with poor signal. PCAs with multifocal irregularity, as well as focal stenosis in the left distal P2 (series 1, image 84) and proximal P3 segment (series 1, images 92-96). Poor visualization of the P3s bilaterally. Anatomic variants: None significant MRA NECK FINDINGS Aortic arch: Three-vessel arch without significant stenosis. No dissection or aneurysm. Right carotid  system: Mild narrowing at the bifurcation, without hemodynamically significant stenosis. No evidence of occlusion or aneurysm. Left carotid system: Mild narrowing at the bifurcation without hemodynamically significant stenosis. No evidence of occlusion or aneurysm. Vertebral arteries: Normal left vertebral artery, which is patent from its origin to the vertebrobasilar junction, without significant stenosis, occlusion, or aneurysm. Multifocal narrowing of the right V1, which appears patent, with intermittent non opacification of the right V2 (for example series 17, images 46-48). Intermittent opacification is also noted in the V3 and V4 segments. Other: None IMPRESSION: 1. No acute infarct. Sequela of chronic small vessel disease, with a remote right parietal cortical infarct that is new compared to 2020. 2. Intermittent non opacification of the right V2 and V3 segments, with non opacification of the majority of the right V4 segment, likely secondary to multifocal atherosclerotic narrowing. No other hemodynamically significant stenosis in the neck. 3. No other intracranial proximal large vessel occlusion, however there is multifocal intracranial atherosclerotic narrowing, with focal non opacification of the distal ACAs. Additional severe stenosis or poor perfusion is noted in the distal right A1, proximal left A2, left M2, distal left P2, bilateral distal MCAs and PCAs, and left posterior communicating artery.  Electronically Signed   By: Merilyn Baba M.D.   On: 07/19/2021 13:27   DG CHEST PORT 1 VIEW  Result Date: 08/11/2021 CLINICAL DATA:  Fever. EXAM: PORTABLE CHEST 1 VIEW COMPARISON:  07/25/2020 FINDINGS: The cardiac silhouette is upper limits of normal in size. Aortic atherosclerosis is noted. Telemetry leads overlie the right hemithorax. The lungs are hypoinflated with minimal bibasilar opacities compatible with atelectasis. No edema, sizable pleural effusion, or pneumothorax is identified. No acute osseous abnormality is seen. IMPRESSION: Minimal bibasilar atelectasis. Electronically Signed   By: Logan Bores M.D.   On: 08/11/2021 20:40   DG Swallowing Func-Speech Pathology  Result Date: 08/13/2021 Table formatting from the original result was not included. Objective Swallowing Evaluation: Type of Study: MBS-Modified Barium Swallow Study  Patient Details Name: Kobe Jansma MRN: [id number] Date of Birth: 06/12/1928 Today's Date: 08/13/2021 Time: SLP Start Time (ACUTE ONLY): 34 -SLP Stop Time (ACUTE ONLY): 1450 SLP Time Calculation (min) (ACUTE ONLY): 14 min Past Medical History: Past Medical History: Diagnosis Date  Alzheimer disease ([place])   Anxiety   CVA (cerebral vascular accident) ([place])   Hypertension  Past Surgical History: No past surgical history on file. HPI: Ms. Denay Pleitez is a 88 y.o. female with history of hypertension, diabetes, Alzheimer disease, stroke (08/2020) and TIA (07/2021) presenting after being found unresponsive, nonverbal, with a left gaze by staff at her nursing home. She was given TNKase at [facility], ED and had a HT. TNKase reversed with TXA and cryo. MRI 2/11: "Bland appearing right thalamic hematoma. There is a background of advanced chronic small vessel disease and remote hypertensive  pattern hemorrhages"  Subjective: poor positioning  Recommendations for follow up therapy are one component of a multi-disciplinary discharge planning process, led by the attending physician.   Recommendations may be updated based on patient status, additional functional criteria and insurance authorization. Assessment / Plan / Recommendation Clinical Impressions 08/13/2021 Clinical Impression Pt presents with a moderate oral dysphagia and a mild pharyngeal dysphagia which is at least in part related to cognitive impairments.  Pt with very poor acceptance of bolus trials today.  There was significant anterior loss of bolus trials, but this appeared to be related to cognition and not to a labial seal insufficiency.  There was pocketing and oral holding.  Lingual discoordination and flutter.  There was incomplete lingual contact to palate and oral residue. There was delayed swallow initiation to pyriform sinuses with both thin and nectar thick liquids.  This resulted in penetration of thin liquid prior to the swallow, which fully cleared during swallow.  There was no penetration with honey thick, puree, or solid consistencies.  Pt may not be able to meet nutritional needs orally 2/2 cognitive changes.  Recommend mechanical soft diet with thin liquid by cup. Administer medications crushed in puree, if permissible. SLP Visit Diagnosis Dysphagia, oropharyngeal phase (R13.12) Attention and concentration deficit following -- Frontal lobe and executive function deficit following -- Impact on safety and function Mild aspiration risk   Treatment Recommendations 08/13/2021 Treatment Recommendations Therapy as outlined in treatment plan below   Prognosis 08/13/2021 Prognosis for Safe Diet Advancement Fair Barriers to Reach Goals Cognitive deficits Barriers/Prognosis Comment -- Diet Recommendations 08/13/2021 SLP Diet Recommendations Dysphagia 3 (Mech soft) solids;Thin liquid Liquid Administration via Cup;No straw Medication Administration Crushed with puree Compensations Slow rate;Small sips/bites Postural Changes Seated upright at 90 degrees   Other Recommendations 08/13/2021 Recommended Consults -- Oral Care  Recommendations Oral care BID Other Recommendations -- Follow Up Recommendations Skilled nursing-short term rehab (<3 hours/day) Assistance recommended at discharge Frequent or constant Supervision/Assistance Functional Status Assessment Patient has had a recent decline in their functional status and demonstrates the ability to make significant improvements in function in a reasonable and predictable amount of time. Frequency and Duration  08/13/2021 Speech Therapy Frequency (ACUTE ONLY) min 2x/week Treatment Duration 2 weeks   Oral Phase 08/13/2021 Oral Phase Impaired Oral - Pudding Teaspoon -- Oral - Pudding Cup -- Oral - Honey Teaspoon -- Oral - Honey Cup -- Oral - Nectar Teaspoon -- Oral - Nectar Cup Left anterior bolus loss;Right anterior bolus loss;Incomplete tongue to palate contact;Reduced posterior propulsion;Pocketing in anterior sulcus;Right pocketing in lateral sulci;Left pocketing in lateral sulci;Premature spillage Oral - Nectar Straw -- Oral - Thin Teaspoon -- Oral - Thin Cup Left anterior bolus loss;Right anterior bolus loss;Reduced posterior propulsion;Incomplete tongue to palate contact;Right pocketing in lateral sulci;Left pocketing in lateral sulci;Pocketing in anterior sulcus;Premature spillage Oral - Thin Straw -- Oral - Puree Reduced posterior propulsion;Premature spillage Oral - Mech Soft -- Oral - Regular Impaired mastication Oral - Multi-Consistency -- Oral - Pill -- Oral Phase - Comment --  Pharyngeal Phase 08/13/2021 Pharyngeal Phase Impaired Pharyngeal- Pudding Teaspoon -- Pharyngeal -- Pharyngeal- Pudding Cup -- Pharyngeal -- Pharyngeal- Honey Teaspoon -- Pharyngeal -- Pharyngeal- Honey Cup -- Pharyngeal -- Pharyngeal- Nectar Teaspoon -- Pharyngeal -- Pharyngeal- Nectar Cup -- Pharyngeal -- Pharyngeal- Nectar Straw -- Pharyngeal -- Pharyngeal- Thin Teaspoon -- Pharyngeal -- Pharyngeal- Thin Cup -- Pharyngeal -- Pharyngeal- Thin Straw -- Pharyngeal -- Pharyngeal- Puree -- Pharyngeal --  Pharyngeal- Mechanical Soft -- Pharyngeal -- Pharyngeal- Regular -- Pharyngeal -- Pharyngeal- Multi-consistency -- Pharyngeal -- Pharyngeal- Pill -- Pharyngeal -- Pharyngeal Comment --  Cervical Esophageal Phase  08/13/2021 Cervical Esophageal Phase WFL Pudding Teaspoon -- Pudding Cup -- Honey Teaspoon -- Honey Cup -- Nectar Teaspoon -- Nectar Cup -- Nectar Straw -- Thin Teaspoon -- Thin Cup -- Thin Straw -- Puree -- Mechanical Soft -- Regular -- Multi-consistency -- Pill -- Cervical Esophageal Comment -- Celedonio Savage, MA, CCC-SLP Acute Rehabilitation Services Office: [phone] 08/13/2021, 4:08 PM                     EEG adult  Result Date: 07/19/2021 Jaynie Bream, MD  07/19/2021  9:36 PM ROUTINE EEG REPORT DATE OF STUDY:  07/19/21 from 16:55 to 17:35 HISTORY: 88yo woman with prior stroke with residual left sided weakness with episode of left sided weakness and slurred speech. DESCRIPTION: During maximal wakefulness, the background was organized and continuous but mildly slow.  There was a posterior dominant rhythm seen up to 6 Hz. Spontaneous variability and reactivity to stimulation were present. Stage I (N1) sleep was recorded, with alpha dropout, slow roving eye movements, high-voltage centrally predominant vertex waves, and positive occipital sharp transients of sleep (POSTS). Stage II (N2) sleep was recorded, with bilateral and symmetric K complexes, and sleep spindles at 12-16 Hz. There was a mild asymmetry with decreased amplitudes seen over the right frontal region. There were frequent very brief bursts of polymorphic delta slowing seen moreso over the right hemisphere between 1-5 seconds. No epileptiform discharges were recorded. No clinical or electrographic seizures were recorded.  Hyperventilation and photic stimulation were not performed. CLASSIFICATION (EEG IMPRESSION): Abnormal Significance II (awake, asleep) Intermittent slow, lateralized, right Asymmetry (mild), right frontal  decreased Background slow CLINICAL INTERPRETATION: 1) There is evidence of moderate focal cerebral dysfunction located over the right frontal region which is in keeping with patient's history of prior infarct located there. 2) There is also evidence of a diffuse mild to moderate encephalopathy which is non-specific and could be related to underlying neurocognitive disorders, multiple prior infarcts, or sedating medications. 3) No seizures were seen on this recording.  A routine EEG does not exclude a diagnosis of epilepsy. Colby A. Marvel Plan, MD Neurology and Clinical Neurophysiology  ECHOCARDIOGRAM COMPLETE  Result Date: 07/19/2021    ECHOCARDIOGRAM REPORT   Patient Name:   [facility] Date of Exam: 07/19/2021 Medical Rec #:  [id number]   Height:       66.0 in Accession #:    [id number]  Weight:       164.9 lb Date of Birth:  1928/06/21   BSA:          1.842 m Patient Age:    47 years    BP:           155/58 mmHg Patient Gender: F           HR:           72 bpm. Exam Location:  [facility] Procedure: 2D Echo, Cardiac Doppler and Color Doppler Indications:    Stroke  History:        Patient has no prior history of Echocardiogram examinations.                 Stroke, Signs/Symptoms:Alzheimer's; Risk Factors:Hypertension                 and Diabetes.  Sonographer:    Wenda Low Referring Phys: [id number] OLADAPO ADEFESO IMPRESSIONS  1. Left ventricular ejection fraction, by estimation, is 60 to 65%. The left ventricle has normal function. The left ventricle has no regional wall motion abnormalities. There is mild left ventricular hypertrophy. Left ventricular diastolic parameters are consistent with Grade I diastolic dysfunction (impaired relaxation).  2. Right ventricular systolic function is normal. The right ventricular size is normal. There is mildly elevated pulmonary artery systolic pressure. The estimated right ventricular systolic pressure is 50.5 mmHg.  3. The mitral valve is normal in structure. Trivial  mitral valve regurgitation. No evidence of mitral stenosis.  4. The aortic valve is tricuspid. Aortic valve regurgitation is not visualized. No aortic stenosis is present.  5. The inferior vena  cava is normal in size with greater than 50% respiratory variability, suggesting right atrial pressure of 3 mmHg. FINDINGS  Left Ventricle: Left ventricular ejection fraction, by estimation, is 60 to 65%. The left ventricle has normal function. The left ventricle has no regional wall motion abnormalities. The left ventricular internal cavity size was normal in size. There is  mild left ventricular hypertrophy. Left ventricular diastolic parameters are consistent with Grade I diastolic dysfunction (impaired relaxation). Right Ventricle: The right ventricular size is normal. No increase in right ventricular wall thickness. Right ventricular systolic function is normal. There is mildly elevated pulmonary artery systolic pressure. The tricuspid regurgitant velocity is 2.88  m/s, and with an assumed right atrial pressure of 3 mmHg, the estimated right ventricular systolic pressure is 40.8 mmHg. Left Atrium: Left atrial size was normal in size. Right Atrium: Right atrial size was normal in size. Pericardium: There is no evidence of pericardial effusion. Presence of epicardial fat layer. Mitral Valve: The mitral valve is normal in structure. Trivial mitral valve regurgitation. No evidence of mitral valve stenosis. MV peak gradient, 8.6 mmHg. The mean mitral valve gradient is 2.0 mmHg. Tricuspid Valve: The tricuspid valve is normal in structure. Tricuspid valve regurgitation is trivial. Aortic Valve: The aortic valve is tricuspid. Aortic valve regurgitation is not visualized. No aortic stenosis is present. Aortic valve mean gradient measures 4.0 mmHg. Aortic valve peak gradient measures 7.4 mmHg. Aortic valve area, by VTI measures 2.83 cm. Pulmonic Valve: The pulmonic valve was not well visualized. Pulmonic valve regurgitation is  not visualized. Aorta: The aortic root is normal in size and structure. Venous: The inferior vena cava is normal in size with greater than 50% respiratory variability, suggesting right atrial pressure of 3 mmHg. IAS/Shunts: The interatrial septum was not well visualized.  LEFT VENTRICLE PLAX 2D LVIDd:         4.20 cm     Diastology LVIDs:         3.10 cm     LV e' medial:    5.55 cm/s LV PW:         1.20 cm     LV E/e' medial:  12.8 LV IVS:        1.30 cm     LV e' lateral:   5.00 cm/s LVOT diam:     2.00 cm     LV E/e' lateral: 14.2 LV SV:         83 LV SV Index:   45 LVOT Area:     3.14 cm  LV Volumes (MOD) LV vol d, MOD A2C: 45.8 ml LV vol d, MOD A4C: 47.6 ml LV vol s, MOD A2C: 22.2 ml LV vol s, MOD A4C: 16.3 ml LV SV MOD A2C:     23.6 ml LV SV MOD A4C:     47.6 ml LV SV MOD BP:      28.6 ml RIGHT VENTRICLE RV Basal diam:  3.00 cm RV Mid diam:    2.10 cm RV S prime:     18.60 cm/s TAPSE (M-mode): 2.3 cm LEFT ATRIUM             Index        RIGHT ATRIUM           Index LA diam:        3.90 cm 2.12 cm/m   RA Area:     15.40 cm LA Vol (A2C):   44.6 ml 24.21 ml/m  RA Volume:   36.30 ml  19.70 ml/m LA Vol (A4C):   38.4 ml 20.84 ml/m LA Biplane Vol: 41.8 ml 22.69 ml/m  AORTIC VALVE                    PULMONIC VALVE AV Area (Vmax):    2.84 cm     PV Vmax:       0.92 m/s AV Area (Vmean):   2.86 cm     PV Peak grad:  3.4 mmHg AV Area (VTI):     2.83 cm AV Vmax:           136.00 cm/s AV Vmean:          85.300 cm/s AV VTI:            0.294 m AV Peak Grad:      7.4 mmHg AV Mean Grad:      4.0 mmHg LVOT Vmax:         123.00 cm/s LVOT Vmean:        77.700 cm/s LVOT VTI:          0.265 m LVOT/AV VTI ratio: 0.90  AORTA Ao Root diam: 2.90 cm MITRAL VALVE                TRICUSPID VALVE MV Area (PHT): 2.97 cm     TR Peak grad:   33.2 mmHg MV Area VTI:   2.80 cm     TR Vmax:        288.00 cm/s MV Peak grad:  8.6 mmHg MV Mean grad:  2.0 mmHg     SHUNTS MV Vmax:       1.47 m/s     Systemic VTI:  0.26 m MV Vmean:      65.1  cm/s    Systemic Diam: 2.00 cm MV Decel Time: 255 msec MV E velocity: 71.10 cm/s MV A velocity: 113.00 cm/s MV E/A ratio:  0.63 Oswaldo Milian MD Electronically signed by Oswaldo Milian MD Signature Date/Time: 07/19/2021/3:42:13 PM    Final    CT HEAD CODE STROKE WO CONTRAST  Result Date: 08/11/2021 CLINICAL DATA:  Code stroke.  Neuro deficit, acute, stroke suspected EXAM: CT HEAD WITHOUT CONTRAST TECHNIQUE: Contiguous axial images were obtained from the base of the skull through the vertex without intravenous contrast. RADIATION DOSE REDUCTION: This exam was performed according to the departmental dose-optimization program which includes automated exposure control, adjustment of the mA and/or kV according to patient size and/or use of iterative reconstruction technique. COMPARISON:  07/18/2021. FINDINGS: Brain: Remote right frontal parietal infarct. Slightly increased hypoattenuation in the right thalamus. Otherwise, no evidence of acute infarct by CT. Additional advanced patchy and confluent white matter hypoattenuation, nonspecific but compatible chronic microvascular ischemic disease. No mass lesion, midline shift, hydrocephalus, or acute hemorrhage. Vascular: No hyperdense vessel identified. Calcific intracranial atherosclerosis. Skull: No acute fracture. Sinuses/Orbits: Clear sinuses.  Unremarkable orbits. Other: No mastoid effusions. IMPRESSION: 1. Slightly increased hypoattenuation in the right thalamus. This could represent the sequela of advanced chronic microvascular ischemic disease, but acute infarct is difficult to exclude. An MRI could further evaluate if clinically indicated. 2. No acute hemorrhage. 3. Remote right frontoparietal infarct and similar advanced chronic microvascular ischemic disease. An MRI could provide more sensitive evaluation for acute infarct. Findings discussed with Dr. Roderic Palau via telephone at 11:42 a.m. Electronically Signed   By: Margaretha Sheffield M.D.   On:  08/11/2021 11:46   CT HEAD CODE STROKE WO CONTRAST  Result Date: 07/18/2021 CLINICAL DATA:  Code  stroke. EXAM: CT HEAD WITHOUT CONTRAST TECHNIQUE: Contiguous axial images were obtained from the base of the skull through the vertex without intravenous contrast. RADIATION DOSE REDUCTION: This exam was performed according to the departmental dose-optimization program which includes automated exposure control, adjustment of the mA and/or kV according to patient size and/or use of iterative reconstruction technique. COMPARISON:  Prior CT from 11/13/2020. FINDINGS: Brain: Diffuse prominence of the CSF containing spaces compatible generalized cerebral atrophy. Patchy and confluent hypodensity involving the supratentorial cerebral white matter most consistent with chronic small vessel ischemic disease, moderate in nature. Few scatter remote lacunar infarcts present about the deep gray nuclei. No acute intracranial hemorrhage. Hypodensity involving the parasagittal right frontoparietal region adjacent to the falx consistent with a right ACA territory ischemic infarct, age indeterminate. Upon review of prior CT from 11/13/2020, there is suggestion of a small acute ischemic infarct at this location on prior head CT, although the area involved on today's exam appears larger, and a superimposed acute component would be difficult to exclude. No other visible large vessel territory infarct. No mass lesion, mass effect, or midline shift. No hydrocephalus or extra-axial fluid collection. Vascular: No visible hyperdense vessel. Calcified atherosclerosis present at the skull base. Skull: Scalp soft tissues and calvarium within normal limits. Sinuses/Orbits: Globes and orbital soft tissues demonstrate no acute finding. Visualized paranasal sinuses and mastoid air cells are clear. Other: None. ASPECTS ([place] Stroke Program Early CT Score) - Ganglionic level infarction (caudate, lentiform nuclei, internal capsule, insula, M1-M3  cortex): 7 - Supraganglionic infarction (M4-M6 cortex): 3 Total score (0-10 with 10 being normal): 10 IMPRESSION: 1. Age-indeterminate posterior right ACA territory infarct involving the parasagittal right frontoparietal region. While this could be chronic in nature, a possible acute to subacute component is difficult to exclude, and could be considered in the correct clinical setting. No intracranial hemorrhage. 2. ASPECTS is 10. 3. Underlying age-related cerebral atrophy with moderate chronic microvascular ischemic disease. Critical Value/emergent results were called by telephone at the time of interpretation on 07/18/2021 at 10:51 pm to provider Godfrey Pick , who verbally acknowledged these results. Electronically Signed   By: Jeannine Boga M.D.   On: 07/18/2021 22:58    Microbiology: Results for orders placed or performed during the hospital encounter of 08/11/21  Resp Panel by RT-PCR (Flu A&B, Covid) Nasopharyngeal Swab     Status: None   Collection Time: 08/11/21 11:29 AM   Specimen: Nasopharyngeal Swab; Nasopharyngeal(NP) swabs in vial transport medium  Result Value Ref Range Status   SARS Coronavirus 2 by RT PCR NEGATIVE NEGATIVE Final    Comment: (NOTE) SARS-CoV-2 target nucleic acids are NOT DETECTED.  The SARS-CoV-2 RNA is generally detectable in upper respiratory specimens during the acute phase of infection. The lowest concentration of SARS-CoV-2 viral copies this assay can detect is 138 copies/mL. A negative result does not preclude SARS-Cov-2 infection and should not be used as the sole basis for treatment or other patient management decisions. A negative result may occur with  improper specimen collection/handling, submission of specimen other than nasopharyngeal swab, presence of viral mutation(s) within the areas targeted by this assay, and inadequate number of viral copies(<138 copies/mL). A negative result must be combined with clinical observations, patient history,  and epidemiological information. The expected result is Negative.  Fact Sheet for Patients:  EntrepreneurPulse.com.au  Fact Sheet for Healthcare Providers:  IncredibleEmployment.be  This test is no t yet approved or cleared by the [place] FDA and  has been authorized for detection and/or  diagnosis of SARS-CoV-2 by FDA under an Emergency Use Authorization (EUA). This EUA will remain  in effect (meaning this test can be used) for the duration of the COVID-19 declaration under Section 564(b)(1) of the Act, 21 U.S.C.section 360bbb-3(b)(1), unless the authorization is terminated  or revoked sooner.       Influenza A by PCR NEGATIVE NEGATIVE Final   Influenza B by PCR NEGATIVE NEGATIVE Final    Comment: (NOTE) The Xpert Xpress SARS-CoV-2/FLU/RSV plus assay is intended as an aid in the diagnosis of influenza from Nasopharyngeal swab specimens and should not be used as a sole basis for treatment. Nasal washings and aspirates are unacceptable for Xpert Xpress SARS-CoV-2/FLU/RSV testing.  Fact Sheet for Patients: EntrepreneurPulse.com.au  Fact Sheet for Healthcare Providers: IncredibleEmployment.be  This test is not yet approved or cleared by the [place] FDA and has been authorized for detection and/or diagnosis of SARS-CoV-2 by FDA under an Emergency Use Authorization (EUA). This EUA will remain in effect (meaning this test can be used) for the duration of the COVID-19 declaration under Section 564(b)(1) of the Act, 21 U.S.C. section 360bbb-3(b)(1), unless the authorization is terminated or revoked.  Performed at [facility], [address], [place], [place] [address]   MRSA Next Gen by PCR, Nasal     Status: Abnormal   Collection Time: 08/12/21  1:04 AM   Specimen: Nasal Mucosa; Nasal Swab  Result Value Ref Range Status   MRSA by PCR Next Gen DETECTED (A) NOT DETECTED Final    Comment: RESULT  CALLED TO, READ BACK BY AND VERIFIED WITH: RN JONATHAN SMITH 08/12/21@2 :2 BY TW (NOTE) The GeneXpert MRSA Assay (FDA approved for NASAL specimens only), is one component of a comprehensive MRSA colonization surveillance program. It is not intended to diagnose MRSA infection nor to guide or monitor treatment for MRSA infections. Test performance is not FDA approved in patients less than 24 years old. Performed at [facility], [place] [address] [place], [place] [address]     Labs: CBC: Recent Labs  Lab 08/11/21 1157 08/12/21 0201 08/13/21 0504 08/14/21 0552 08/15/21 0342 08/16/21 0347  WBC 9.3 11.0* 5.6 5.3 4.9 4.8  NEUTROABS 7.1  --   --   --   --   --   HGB 11.4* 10.7* 9.2* 8.5* 8.4* 8.9*  HCT 35.5* 31.6* 28.3* 25.4* 25.5* 26.6*  MCV 88.8 86.8 86.0 85.2 85.0 84.7  PLT 236 255 158 183 165 920   Basic Metabolic Panel: Recent Labs  Lab 08/12/21 0201 08/13/21 0504 08/14/21 0552 08/15/21 0342 08/16/21 0347  NA 141 142 140 138 139  K 4.6 3.9 3.6 3.5 3.7  CL 108 111 110 109 109  CO2 19* 21* 21* 20* 21*  GLUCOSE 120* 96 90 110* 123*  BUN 18 14 13 13 17   CREATININE 1.25* 1.00 1.08* 1.02* 1.04*  CALCIUM 9.1 8.8* 8.7* 9.1 9.1   Liver Function Tests: Recent Labs  Lab 08/11/21 1157  AST 25  ALT 20  ALKPHOS 64  BILITOT 0.8  PROT 7.1  ALBUMIN 4.0   CBG: Recent Labs  Lab 08/16/21 2002 08/16/21 2319 08/17/21 0432 08/17/21 0833 08/17/21 1145  GLUCAP 140* 115* 128* 121* 149*    Discharge time spent: greater than 30 minutes.  Signed: Patrecia Pour, MD [facility] 08/17/2021

## 2021-08-18 ENCOUNTER — Encounter: Payer: Self-pay | Admitting: Adult Health

## 2021-08-18 ENCOUNTER — Inpatient Hospital Stay
Admission: RE | Admit: 2021-08-18 | Discharge: 2021-09-30 | Disposition: E | Payer: Medicare Other | Source: Ambulatory Visit | Attending: Internal Medicine | Admitting: Internal Medicine

## 2021-08-18 ENCOUNTER — Non-Acute Institutional Stay (SKILLED_NURSING_FACILITY): Payer: Medicare Other | Admitting: Adult Health

## 2021-08-18 DIAGNOSIS — N1831 Chronic kidney disease, stage 3a: Secondary | ICD-10-CM

## 2021-08-18 DIAGNOSIS — E785 Hyperlipidemia, unspecified: Secondary | ICD-10-CM

## 2021-08-18 DIAGNOSIS — E1169 Type 2 diabetes mellitus with other specified complication: Secondary | ICD-10-CM

## 2021-08-18 DIAGNOSIS — F411 Generalized anxiety disorder: Secondary | ICD-10-CM | POA: Diagnosis not present

## 2021-08-18 DIAGNOSIS — E1122 Type 2 diabetes mellitus with diabetic chronic kidney disease: Secondary | ICD-10-CM | POA: Diagnosis not present

## 2021-08-18 DIAGNOSIS — N183 Chronic kidney disease, stage 3 unspecified: Secondary | ICD-10-CM | POA: Diagnosis not present

## 2021-08-18 DIAGNOSIS — E1165 Type 2 diabetes mellitus with hyperglycemia: Secondary | ICD-10-CM

## 2021-08-18 DIAGNOSIS — G309 Alzheimer's disease, unspecified: Secondary | ICD-10-CM | POA: Diagnosis not present

## 2021-08-18 DIAGNOSIS — G8929 Other chronic pain: Secondary | ICD-10-CM

## 2021-08-18 DIAGNOSIS — R2681 Unsteadiness on feet: Secondary | ICD-10-CM | POA: Diagnosis not present

## 2021-08-18 DIAGNOSIS — I69398 Other sequelae of cerebral infarction: Secondary | ICD-10-CM | POA: Diagnosis not present

## 2021-08-18 DIAGNOSIS — F0280 Dementia in other diseases classified elsewhere without behavioral disturbance: Secondary | ICD-10-CM | POA: Diagnosis not present

## 2021-08-18 DIAGNOSIS — D649 Anemia, unspecified: Secondary | ICD-10-CM | POA: Diagnosis not present

## 2021-08-18 DIAGNOSIS — E559 Vitamin D deficiency, unspecified: Secondary | ICD-10-CM

## 2021-08-18 DIAGNOSIS — Z741 Need for assistance with personal care: Secondary | ICD-10-CM | POA: Diagnosis not present

## 2021-08-18 DIAGNOSIS — R52 Pain, unspecified: Secondary | ICD-10-CM

## 2021-08-18 DIAGNOSIS — J309 Allergic rhinitis, unspecified: Secondary | ICD-10-CM

## 2021-08-18 DIAGNOSIS — F339 Major depressive disorder, recurrent, unspecified: Secondary | ICD-10-CM

## 2021-08-18 DIAGNOSIS — I129 Hypertensive chronic kidney disease with stage 1 through stage 4 chronic kidney disease, or unspecified chronic kidney disease: Secondary | ICD-10-CM

## 2021-08-18 DIAGNOSIS — R630 Anorexia: Secondary | ICD-10-CM | POA: Diagnosis not present

## 2021-08-18 DIAGNOSIS — R627 Adult failure to thrive: Secondary | ICD-10-CM | POA: Diagnosis not present

## 2021-08-18 DIAGNOSIS — N390 Urinary tract infection, site not specified: Secondary | ICD-10-CM

## 2021-08-18 DIAGNOSIS — Z993 Dependence on wheelchair: Secondary | ICD-10-CM | POA: Diagnosis not present

## 2021-08-18 DIAGNOSIS — M2550 Pain in unspecified joint: Secondary | ICD-10-CM | POA: Diagnosis not present

## 2021-08-18 DIAGNOSIS — K219 Gastro-esophageal reflux disease without esophagitis: Secondary | ICD-10-CM

## 2021-08-18 DIAGNOSIS — I152 Hypertension secondary to endocrine disorders: Secondary | ICD-10-CM | POA: Diagnosis not present

## 2021-08-18 DIAGNOSIS — Z515 Encounter for palliative care: Secondary | ICD-10-CM | POA: Diagnosis not present

## 2021-08-18 DIAGNOSIS — M549 Dorsalgia, unspecified: Secondary | ICD-10-CM | POA: Diagnosis not present

## 2021-08-18 DIAGNOSIS — I10 Essential (primary) hypertension: Secondary | ICD-10-CM | POA: Diagnosis not present

## 2021-08-18 DIAGNOSIS — Z7401 Bed confinement status: Secondary | ICD-10-CM | POA: Diagnosis not present

## 2021-08-18 DIAGNOSIS — R279 Unspecified lack of coordination: Secondary | ICD-10-CM | POA: Diagnosis not present

## 2021-08-18 DIAGNOSIS — M6281 Muscle weakness (generalized): Secondary | ICD-10-CM | POA: Diagnosis not present

## 2021-08-18 DIAGNOSIS — I63412 Cerebral infarction due to embolism of left middle cerebral artery: Secondary | ICD-10-CM | POA: Diagnosis not present

## 2021-08-18 DIAGNOSIS — R5381 Other malaise: Secondary | ICD-10-CM | POA: Diagnosis not present

## 2021-08-18 DIAGNOSIS — I69328 Other speech and language deficits following cerebral infarction: Secondary | ICD-10-CM | POA: Diagnosis not present

## 2021-08-18 DIAGNOSIS — I69321 Dysphasia following cerebral infarction: Secondary | ICD-10-CM | POA: Diagnosis not present

## 2021-08-18 DIAGNOSIS — I639 Cerebral infarction, unspecified: Secondary | ICD-10-CM | POA: Diagnosis not present

## 2021-08-18 LAB — GLUCOSE, CAPILLARY: Glucose-Capillary: 123 mg/dL — ABNORMAL HIGH (ref 70–99)

## 2021-08-18 NOTE — Progress Notes (Signed)
Location:  [facility] Room Number: 133-P Place of Service:  SNF (31)   CODE STATUS: DNR  Allergies  Allergen Reactions   Sulfa Antibiotics Rash    Other reaction(s): Other (See Comments)     Chief Complaint  Patient presents with   Hospitalization Follow-up    HPI:    Past Medical History:  Diagnosis Date   Alzheimer disease ([place])    Anxiety    CVA (cerebral vascular accident) ([place])    Hypertension     History reviewed. No pertinent surgical history.  Social History   Socioeconomic History   Marital status: Widowed    Spouse name: Not on file   Number of children: Not on file   Years of education: Not on file   Highest education level: Not on file  Occupational History   Not on file  Tobacco Use   Smoking status: Unknown   Smokeless tobacco: Not on file  Substance and Sexual Activity   Alcohol use: Not on file   Drug use: Not on file   Sexual activity: Not on file  Other Topics Concern   Not on file  Social History Narrative   Not on file   Social Determinants of Health   Financial Resource Strain: Not on file  Food Insecurity: Not on file  Transportation Needs: Not on file  Physical Activity: Not on file  Stress: Not on file  Social Connections: Not on file  Intimate Partner Violence: Not on file   History reviewed. No pertinent family history.    VITAL SIGNS BP (!) 150/63    Pulse 78    Temp 97.6 F (36.4 C)    Resp 20    Ht 5\' 6"  (1.676 m)    Wt 153 lb (69.4 kg)    SpO2 94%    BMI 24.69 kg/m   Outpatient Encounter Medications as of 08/03/2021  Medication Sig   acetaminophen (TYLENOL) 500 MG tablet Take 500 mg by mouth 2 (two) times daily.   amLODipine (NORVASC) 5 MG tablet Take 1 tablet (5 mg total) by mouth daily.   Balsam [place]-Castor Oil [facility]) OINT Special Instructions: Apply to sacrum, bilateral buttocks, coccyx qshift for prevention. Every Shift   cetirizine (ZYRTEC) 10 MG tablet Take 10 mg by mouth  daily.   fosfomycin (MONUROL) 3 g PACK Take 3 g by mouth every other day.   lisinopril (ZESTRIL) 40 MG tablet Take 40 mg by mouth daily.   loperamide (IMODIUM A-D) 2 MG tablet Take 2 mg by mouth 4 (four) times daily as needed for diarrhea or loose stools.   LORazepam (ATIVAN) 0.5 MG tablet Take 0.5 mg by mouth daily as needed for anxiety.   metFORMIN (GLUCOPHAGE) 500 MG tablet Take 500 mg by mouth 2 (two) times daily.   NYAMYC powder Apply 1 application topically in the morning and at bedtime.   omeprazole (PRILOSEC) 20 MG capsule Take 20 mg by mouth daily.   ondansetron (ZOFRAN-ODT) 4 MG disintegrating tablet Take 1 tablet by mouth every 8 (eight) hours as needed for nausea or vomiting.   pravastatin (PRAVACHOL) 20 MG tablet Take 1 tablet (20 mg total) by mouth daily at 6 PM.   sertraline (ZOLOFT) 50 MG tablet Take 50 mg by mouth daily.   TUMS 500 MG chewable tablet Chew 2 tablets by mouth 3 (three) times daily as needed for heartburn or indigestion.   Vitamin D, Ergocalciferol, (DRISDOL) 1.25 MG (50000 UNIT) CAPS capsule Take 50,000 Units  by mouth once a week. Fridays   [DISCONTINUED] glipiZIDE (GLUCOTROL) 5 MG tablet Take 5 mg by mouth daily.   [DISCONTINUED] pantoprazole (PROTONIX) 20 MG tablet Take 20 mg by mouth daily.   No facility-administered encounter medications on file as of 08/14/2021.     SIGNIFICANT DIAGNOSTIC EXAMS       ASSESSMENT/ PLAN:     Ok Edwards NP [facility]  Contact [phone] Monday through Friday 8am- 5pm  After hours call [phone]

## 2021-08-21 ENCOUNTER — Non-Acute Institutional Stay (SKILLED_NURSING_FACILITY): Payer: Medicare Other | Admitting: Internal Medicine

## 2021-08-21 ENCOUNTER — Encounter: Payer: Self-pay | Admitting: Internal Medicine

## 2021-08-21 ENCOUNTER — Other Ambulatory Visit (HOSPITAL_COMMUNITY)
Admission: RE | Admit: 2021-08-21 | Discharge: 2021-08-21 | Disposition: A | Discharge status: Home / Self Care | Payer: Medicare Other | Source: Skilled Nursing Facility | Attending: Adult Health | Admitting: Adult Health

## 2021-08-21 DIAGNOSIS — I10 Essential (primary) hypertension: Secondary | ICD-10-CM

## 2021-08-21 DIAGNOSIS — I63412 Cerebral infarction due to embolism of left middle cerebral artery: Secondary | ICD-10-CM | POA: Diagnosis not present

## 2021-08-21 DIAGNOSIS — N1831 Chronic kidney disease, stage 3a: Secondary | ICD-10-CM

## 2021-08-21 DIAGNOSIS — D649 Anemia, unspecified: Secondary | ICD-10-CM | POA: Diagnosis not present

## 2021-08-21 DIAGNOSIS — I152 Hypertension secondary to endocrine disorders: Secondary | ICD-10-CM | POA: Insufficient documentation

## 2021-08-21 DIAGNOSIS — F0280 Dementia in other diseases classified elsewhere without behavioral disturbance: Secondary | ICD-10-CM | POA: Diagnosis not present

## 2021-08-21 DIAGNOSIS — N183 Chronic kidney disease, stage 3 unspecified: Secondary | ICD-10-CM | POA: Insufficient documentation

## 2021-08-21 DIAGNOSIS — G309 Alzheimer's disease, unspecified: Secondary | ICD-10-CM | POA: Diagnosis not present

## 2021-08-21 DIAGNOSIS — E1165 Type 2 diabetes mellitus with hyperglycemia: Secondary | ICD-10-CM

## 2021-08-21 LAB — VITAMIN D 25 HYDROXY (VIT D DEFICIENCY, FRACTURES): Vit D, 25-Hydroxy: 121.46 ng/mL — ABNORMAL HIGH (ref 30–100)

## 2021-08-21 NOTE — Assessment & Plan Note (Signed)
Admission H/H 11.4/35.5.  Prior to discharge H/H 8.9/26.6 with normochromic, normocytic indices.  Monitor for any bleeding dyscrasias at the SNF.

## 2021-08-21 NOTE — Assessment & Plan Note (Addendum)
Accu-Cheks twice daily here at the SNF have ranged from a low of 102 up to a high of 160 with average of 123. Current A1c is 5.4%, prediabetic.

## 2021-08-21 NOTE — Patient Instructions (Signed)
See assessment and plan under each diagnosis in the problem list and acutely for this visit 

## 2021-08-21 NOTE — Assessment & Plan Note (Signed)
2/10 - 08/17/2021 presentation as poorly responsive, nonverbal, and exhibiting leftward gaze deviation.  EMS documented blood pressure 170/70.  Patient suffered right thalamic hemorrhage extending into the internal capsule post TNK administration.  Admitted on Cleviprex drip and TXA and cryo administered. MRI revealed bland appearing right thalamic hematoma and remote paracentral right parietal cortex infarct and a small remote right cerebellar infarct.

## 2021-08-21 NOTE — Assessment & Plan Note (Signed)
CNS imaging suggest severe intracerebral vasculopathy.  This and her age suggest vascular dementia rather than Alzheimer's.

## 2021-08-21 NOTE — Progress Notes (Signed)
NURSING HOME LOCATION:  [facility] ROOM NUMBER:  [id number]  CODE STATUS:  DNR  PCP:  Jerene Bears MD  This is a comprehensive admission note to this SNFperformed on this date less than 30 days from date of admission. Included are preadmission medical/surgical history; reconciled medication list; family history; social history and comprehensive review of systems.  Corrections and additions to the records were documented. Comprehensive physical exam was also performed. Additionally a clinical summary was entered for each active diagnosis pertinent to this admission in the Problem List to enhance continuity of care.  HPI: She was hospitalized 2/10 - 08/17/2021 presenting to the ED from the memory care with a code stroke.  This was clinically manifest as her being poorly responsive, nonverbal, and exhibiting a leftward gaze deviation.  EMS documented blood pressure 170/70.  CT showed no acute abnormality but possible slightly decreased right thalamic hypodensity.  Thrombolytic therapy was given after discussion with the patient's daughter.  The patient suffered a right thalamic hemorrhage extending into the internal capsule post TNK administration.  She was admitted and started on Cleviprex drip, and given TXA and cryo.  Deficits remained stable as did images.  Antiplatelet agents were discontinued. CTA of the head and neck revealed multifocal severe stenosis of the right vertebral artery with occlusion at its dural margin with largely occluded intradural involvement as well.  Severe distal a 3-A4 ACA stenosis bilaterally with intermittent/poor distal opacification.  MRI revealed bland appearing right thalamic hematoma and remote parasagittal right parietal cortex infarct.  Also small remote right cerebellar infarct. The patient had been residing in the memory care unit.  DNR was in place at the time of admission.  It was reported that she had had decreasing functional status for at least 2  years.  Family expressed no interest in placing a feeding tube.  Hospice was to be pursued if the patient nutritional needs cannot be met orally.  [facility] has been contacted and is to continue follow-up post discharge. She received 3 doses of ceftriaxone for acute lower UTI.  She had baseline maintenance therapy of chronic suppressive antibiotics. Blood pressure goal was to be established as a systolic of less than 921.  Lisinopril was continued at 40 mg daily and amlodipine increased to 5 mg. She was discharged to this facility rather than back to [facility] care unit.  Past medical and surgical history: Includes history of CVA, essential hypertension, anxiety, and Alzheimer's disease.  Social history: Nondrinker; smoking history unknown.  Family history: non contributary due to advanced age.   Review of systems could not be completed due to nonverbal state.  Staff states that she will seldom respond to stimuli but does interact with family members to some degree.  She cannot follow commands.   Physical Exam: Pertinent or positive findings: She was sitting in her wheelchair at bedside.  She was nonverbal except when she said "hi" as we finished our exam.  She kept her eyes closed except for a brief moment at the end of the exam as well.  There was no jaundice or scleral icterus present.  She would not open her mouth for exam.  Exam was limited but she appears to have all her own teeth.  Heart sounds are distant and slow.  Breath sounds are decreased.  Abdomen is protuberant.  Pedal pulses are decreased.  She has isolated DIP and PIP arthritic changes of the hands.  The left knee is fusiformly enlarged.  She has  crepitus in the right knee.  When the left upper extremity is lifted it drops back to her lap limply.  She is there is resistance to extension in the left lower extremity.  General appearance:  no acute distress, increased work of breathing is present.   Lymphatic: No  lymphadenopathy about the head, neck, axilla. Eyes: No conjunctival inflammation or lid edema is present. There is no scleral icterus. Ears:  External ear exam shows no significant lesions or deformities.   Nose:  External nasal examination shows no deformity or inflammation. Nasal mucosa are pink and moist without lesions, exudates Neck:  No thyromegaly, masses, tenderness noted.    Heart:  No gallop, murmur, click, rub.  Lungs: without wheezes, rhonchi, rales, rubs. Abdomen: Bowel sounds are normal.  Abdomen is soft and nontender with no organomegaly, hernias, masses. GU: Deferred  Extremities:  No cyanosis, clubbing, edema. Neurologic exam: Balance, Rhomberg, finger to nose testing could not be completed due to clinical state Skin: Warm & dry w/o tenting. No significant lesions or rash.  See clinical summary under each active problem in the Problem List with associated updated therapeutic plan

## 2021-08-21 NOTE — Assessment & Plan Note (Addendum)
Initial blood pressure 92/52 was rechecked and recording was 147/67.  Permissive hypertension up to systolic of 725 will be the standard in the context of her CNS vascular disease.

## 2021-08-21 NOTE — Assessment & Plan Note (Signed)
Current creatinine 1.04 with a GFR of 51.  No change in lisinopril or metformin unless there is progression of CKD.

## 2021-08-23 DIAGNOSIS — E1165 Type 2 diabetes mellitus with hyperglycemia: Secondary | ICD-10-CM | POA: Insufficient documentation

## 2021-08-23 DIAGNOSIS — I129 Hypertensive chronic kidney disease with stage 1 through stage 4 chronic kidney disease, or unspecified chronic kidney disease: Secondary | ICD-10-CM | POA: Insufficient documentation

## 2021-08-23 DIAGNOSIS — G8929 Other chronic pain: Secondary | ICD-10-CM | POA: Insufficient documentation

## 2021-08-23 DIAGNOSIS — K219 Gastro-esophageal reflux disease without esophagitis: Secondary | ICD-10-CM | POA: Insufficient documentation

## 2021-08-23 DIAGNOSIS — E559 Vitamin D deficiency, unspecified: Secondary | ICD-10-CM | POA: Insufficient documentation

## 2021-08-23 DIAGNOSIS — E1122 Type 2 diabetes mellitus with diabetic chronic kidney disease: Secondary | ICD-10-CM | POA: Insufficient documentation

## 2021-08-23 DIAGNOSIS — N390 Urinary tract infection, site not specified: Secondary | ICD-10-CM | POA: Insufficient documentation

## 2021-08-23 DIAGNOSIS — F339 Major depressive disorder, recurrent, unspecified: Secondary | ICD-10-CM | POA: Insufficient documentation

## 2021-08-23 DIAGNOSIS — E1169 Type 2 diabetes mellitus with other specified complication: Secondary | ICD-10-CM | POA: Insufficient documentation

## 2021-08-23 DIAGNOSIS — N183 Chronic kidney disease, stage 3 unspecified: Secondary | ICD-10-CM | POA: Insufficient documentation

## 2021-08-23 DIAGNOSIS — R52 Pain, unspecified: Secondary | ICD-10-CM | POA: Insufficient documentation

## 2021-08-23 DIAGNOSIS — J309 Allergic rhinitis, unspecified: Secondary | ICD-10-CM | POA: Insufficient documentation

## 2021-08-23 NOTE — Progress Notes (Signed)
Location:  [facility] Room Number: 133-P Place of Service:  SNF (31)   CODE STATUS: DNR  Allergies  Allergen Reactions   Sulfa Antibiotics Rash    Other reaction(s): Other (See Comments)     Chief Complaint  Patient presents with   Hospitalization Follow-up    HPI:  She is a 88 year old Julia [name] who has been hospitalized from 08-11-21 through 08-17-21.  Her medical history includes: hypertension; diabetes; alzheimer's disease .  She presented to [facility] ED from memory care due to being poorly responsive; nonverbal and leftward gaze deviation/  She is status post TNKase on 08-11-21 at 3 pm. Had interval acute hemorrhage within Julia right thalamus region extending into internal capsule measuring 2.7 x 1.9 x 1.6 cm. Julia repeat ct scan demonstrated no significant change in Julia hemorrhage.  asa and plavix have been stopped. She is here for short term rehab. More than likely this does represent a long term placement for her. There are no indications of pain or discomfort present. No indications of aspiration present. She will continue to be followed for her chronic illnesses including:    Alzheimer's disease:  CKD stage 3 due to type 2 diabetes mellitus:  Hyperlipidemia associated with type 2 diabetes mellitus:  Hypertension associated with stage 3 chronic kidney disease due to type 2 diabetes mellitus:    Past Medical History:  Diagnosis Date   Alzheimer disease ([place])    Anxiety    CVA (cerebral vascular accident) ([place])    Hypertension     History reviewed. No pertinent surgical history.  Social History   Socioeconomic History   Marital status: Widowed    Spouse name: Not on file   Number of children: Not on file   Years of education: Not on file   Highest education level: Not on file  Occupational History   Not on file  Tobacco Use   Smoking status: Unknown   Smokeless tobacco: Not on file  Substance and Sexual Activity   Alcohol use: Not on file   Drug  use: Not on file   Sexual activity: Not on file  Other Topics Concern   Not on file  Social History Narrative   Not on file   Social Determinants of Health   Financial Resource Strain: Not on file  Food Insecurity: Not on file  Transportation Needs: Not on file  Physical Activity: Not on file  Stress: Not on file  Social Connections: Not on file  Intimate Partner Violence: Not on file   History reviewed. No pertinent family history.    VITAL SIGNS BP (!) 150/63    Pulse 78    Temp 97.6 F (36.4 C)    Resp 20    Ht 5\' 6"  (1.676 m)    Wt 153 lb (69.4 kg)    SpO2 94%    BMI 24.69 kg/m   Outpatient Encounter Medications as of 08/29/2021  Medication Sig   acetaminophen (TYLENOL) 500 MG tablet Take 500 mg by mouth 2 (two) times daily.   amLODipine (NORVASC) 5 MG tablet Take 1 tablet (5 mg total) by mouth daily.   Balsam [place] [employer] [facility]) OINT Special Instructions: Apply to sacrum, bilateral buttocks, coccyx qshift for prevention. Every Shift   cetirizine (ZYRTEC) 10 MG tablet Take 10 mg by mouth daily.   fosfomycin (MONUROL) 3 g PACK Take 3 g by mouth every other day.   lisinopril (ZESTRIL) 40 MG tablet Take 40 mg by mouth daily.  loperamide (IMODIUM A-D) 2 MG tablet Take 2 mg by mouth 4 (four) times daily as needed for diarrhea or loose stools.   LORazepam (ATIVAN) 0.5 MG tablet Take 0.5 mg by mouth daily as needed for anxiety.   metFORMIN (GLUCOPHAGE) 500 MG tablet Take 500 mg by mouth 2 (two) times daily.   NYAMYC powder Apply 1 application topically in Julia morning and at bedtime.   omeprazole (PRILOSEC) 20 MG capsule Take 20 mg by mouth daily.   ondansetron (ZOFRAN-ODT) 4 MG disintegrating tablet Take 1 tablet by mouth every 8 (eight) hours as needed for nausea or vomiting.   pravastatin (PRAVACHOL) 20 MG tablet Take 1 tablet (20 mg total) by mouth daily at 6 PM.   sertraline (ZOLOFT) 50 MG tablet Take 50 mg by mouth daily.   TUMS 500 MG chewable tablet Chew 2 tablets  by mouth 3 (three) times daily as needed for heartburn or indigestion.   Vitamin D, Ergocalciferol, (DRISDOL) 1.25 MG (50000 UNIT) CAPS capsule Take 50,000 Units by mouth once a week. Fridays   No facility-administered encounter medications on file as of 08/24/2021.     SIGNIFICANT DIAGNOSTIC EXAMS  TODAY  08-11-21: ct head:  1. Slightly increased hypoattenuation in Julia right thalamus. This could represent Julia sequela of advanced chronic microvascular ischemic disease, but acute infarct is difficult to exclude. An MRI could further evaluate if clinically indicated. 2. No acute hemorrhage. 3. Remote right frontoparietal infarct and similar advanced chronic microvascular ischemic disease. An MRI could provide more sensitive evaluation for acute infarct.  08-11-21: cta of head and neck:  1. Multifocal severe stenosis of Julia right vertebral artery in Julia neck with occlusion at its dural margin and largely occluded intradurally. 2. Severe distal A3/A4 ACA stenosis bilaterally with intermittent/poor distal opacification. 3. Suspected severe bilateral M2 MCA stenosis, although evaluation is limited by motion. 4. Mild-to-moderate bilateral paraclinoid ICA stenosis. 5. Moderate to severe left P2 PCA stenosis. 6. Bilateral carotid bifurcation with proximally 50% right and 40% left proximal ICA stenosis. 7. Small (1-2 mm) outpouching at Julia right proximal basilar artery is suspicious for an aneurysm, although retrograde flow into Julia occluded distal right vertebral artery is a differential consideration. 8. Approximately 1.8 cm ground-glass opacity in Julia imaged right upper lobe and 4 mm area consolidation. These areas may be infectious/inflammatory; however, recommend follow-up CT chest in 3-6 months to ensure resolution and exclude malignancy. If areas persist, subsequent management will be based upon Julia most suspicious nodule(s).   08-11-21: chest x-ray: Minimal bibasilar atelectasis   08-12-21: mri  of brain:  Bland appearing right thalamic hematoma. There is a background of advanced chronic small vessel disease and remote hypertensive pattern hemorrhages.  LABS REVIEWED:   08-11-21; wbc 9.3; hgb 11.4; hct 35.5; mcv 88.8 plt 236; glucose 75; bun 21; creat 1.06; k+ 4.0; na++ 140; ca 9.5; GFR 50; liver normal albumin 4.0 08-12-21: hgb a1c 5.4; chol 115; ldl 64.9 trig 424; hdl 20 08-15-21: wbc 4.9; hgb 8.4; hct 25.5; mcv 85.0 plt 165; glucose 110; bun 13; creat 1.02 ;k+ 3.5; na++ 138; ca 9.1 GFR 52 08-16-21; wbc 4.8; hgb 8.9; hct 26.6; mcv 84.7 plt 189; glucose 123; bun 17; creat 1.04; k+ 3.7; na++ 139; ca 9.1; GFR 51    Review of Systems  Reason unable to perform ROS: nonvearbal.    Physical Exam Constitutional:      General: She is not in acute distress.    Appearance: She is well-developed. She is not  diaphoretic.  HENT:     Mouth/Throat:     Mouth: Mucous membranes are moist.     Pharynx: Oropharynx is clear.  Eyes:     Comments: Gaze deviation to left no PERRL   Neck:     Thyroid: No thyromegaly.  Cardiovascular:     Rate and Rhythm: Normal rate and regular rhythm.     Heart sounds: Normal heart sounds.  Pulmonary:     Effort: Pulmonary effort is normal. No respiratory distress.     Breath sounds: Normal breath sounds.  Abdominal:     General: Bowel sounds are normal. There is no distension.     Palpations: Abdomen is soft.     Tenderness: There is no abdominal tenderness.  Musculoskeletal:     Cervical back: Neck supple.     Right lower leg: No edema.     Left lower leg: No edema.     Comments: Left leg contracted Right arm contracted   Lymphadenopathy:     Cervical: No cervical adenopathy.  Skin:    General: Skin is warm and dry.  Neurological:     Mental Status: She is alert. Mental status is at baseline.  Psychiatric:        Mood and Affect: Mood normal.      ASSESSMENT/ PLAN:  TODAY   Cerebrovascular accident (CVA) due to embolism of left middle  cerebral artery: is stable at this time. Will continue therapy as directed to improve upon her level of independence with her adls.   2. Alzheimer's disease: is without change; is nonverbal weight is 153 pounds will monitor  3. CKD stage 3 due to type 2 diabetes mellitus: is stable bun 17; creat 1.04; GFR 51   4. Hyperlipidemia associated with type 2 diabetes mellitus: trig 424; ldl 64.9; will continue pravahol 20 mg daily   5. Hypertension associated with stage 3 chronic kidney disease due to type 2 diabetes mellitus: 150/63 will continue norvasc 5 mg daily lisinopril 40 mg daily   6. Controlled type diabetes mellitus with hyperglycemia without long term current use of insulin: hgb a1c 5.4; will stop glipizide and will continue metformin 500 mg twice daily   7. Normochromic normocytic anemia: hgb 8.9 will monitor   8. Chronic UTI: will continue monurol 3 gm every other day   9. Vitamin D deficiency: will continue 50,000 units weekly and will recheck level  10. Chronic generalized pain: is stable will continue tylenol 500 mg twice daily   11. GERD without esophagitis: is stable will continue prilosec 20 mg daily   12.  Major depression recurrent chronic: is stable will continue zoloft 50 mg daily and will continue ativan 0.5 mg daily as needed  13. Chronic nonseasonal allergic rhinitis: is stable will continue zyrtec 10 mg daily      Ok Edwards NP [facility]  call [phone]

## 2021-08-25 ENCOUNTER — Non-Acute Institutional Stay (SKILLED_NURSING_FACILITY): Payer: Medicare Other | Admitting: Adult Health

## 2021-08-25 ENCOUNTER — Encounter: Payer: Self-pay | Admitting: Adult Health

## 2021-08-25 DIAGNOSIS — G309 Alzheimer's disease, unspecified: Secondary | ICD-10-CM | POA: Diagnosis not present

## 2021-08-25 DIAGNOSIS — F0280 Dementia in other diseases classified elsewhere without behavioral disturbance: Secondary | ICD-10-CM | POA: Diagnosis not present

## 2021-08-25 NOTE — Progress Notes (Signed)
Location:  [facility] Room Number: 133-P Place of Service:  SNF (31)   CODE STATUS: DNR  Allergies  Allergen Reactions   Sulfa Antibiotics Rash    Other reaction(s): Other (See Comments)     Chief Complaint  Patient presents with   Acute Visit    Poor appetite with weight loss.    HPI:  She has a poor appetite. She is losing weight from 165 pounds in jan to her current weight os 153 pounds. There are no indications of pain present. Her albumin is 4.0.   Past Medical History:  Diagnosis Date   Alzheimer disease ([place])    Anxiety    CVA (cerebral vascular accident) ([place])    Hypertension     History reviewed. No pertinent surgical history.  Social History   Socioeconomic History   Marital status: Widowed    Spouse name: Not on file   Number of children: Not on file   Years of education: Not on file   Highest education level: Not on file  Occupational History   Not on file  Tobacco Use   Smoking status: Unknown   Smokeless tobacco: Not on file  Substance and Sexual Activity   Alcohol use: Not on file   Drug use: Not on file   Sexual activity: Not on file  Other Topics Concern   Not on file  Social History Narrative   Not on file   Social Determinants of Health   Financial Resource Strain: Not on file  Food Insecurity: Not on file  Transportation Needs: Not on file  Physical Activity: Not on file  Stress: Not on file  Social Connections: Not on file  Intimate Partner Violence: Not on file   History reviewed. No pertinent family history.    VITAL SIGNS BP 122/64    Pulse 78    Temp (!) 97.5 F (36.4 C)    Resp 20    Ht 5\' 6"  (1.676 m)    Wt 153 lb (69.4 kg)    SpO2 98%    BMI 24.69 kg/m   Outpatient Encounter Medications as of 08/25/2021  Medication Sig   acetaminophen (TYLENOL) 500 MG tablet Take 500 mg by mouth 2 (two) times daily.   amLODipine (NORVASC) 5 MG tablet Take 1 tablet (5 mg total) by mouth daily.   Balsam  [place] [employer] [facility]) OINT Special Instructions: Apply to sacrum, bilateral buttocks, coccyx qshift for prevention. Every Shift   cetirizine (ZYRTEC) 10 MG tablet Take 10 mg by mouth daily.   fosfomycin (MONUROL) 3 g PACK Take 3 g by mouth every other day.   lisinopril (ZESTRIL) 40 MG tablet Take 40 mg by mouth daily.   loperamide (IMODIUM A-D) 2 MG tablet Take 2 mg by mouth 4 (four) times daily as needed for diarrhea or loose stools.   LORazepam (ATIVAN) 0.5 MG tablet Take 0.5 mg by mouth daily as needed for anxiety.   metFORMIN (GLUCOPHAGE) 500 MG tablet Take 500 mg by mouth 2 (two) times daily.   omeprazole (PRILOSEC) 20 MG capsule Take 20 mg by mouth daily.   ondansetron (ZOFRAN-ODT) 4 MG disintegrating tablet Take 1 tablet by mouth every 8 (eight) hours as needed for nausea or vomiting.   pravastatin (PRAVACHOL) 20 MG tablet Take 1 tablet (20 mg total) by mouth daily at 6 PM.   sertraline (ZOLOFT) 50 MG tablet Take 50 mg by mouth daily.   TUMS 500 MG chewable tablet Chew 2 tablets by  mouth 3 (three) times daily as needed for heartburn or indigestion.   [DISCONTINUED] [facility] powder Apply 1 application topically in the morning and at bedtime.   [DISCONTINUED] Vitamin D, Ergocalciferol, (DRISDOL) 1.25 MG (50000 UNIT) CAPS capsule Take 50,000 Units by mouth once a week. Fridays   No facility-administered encounter medications on file as of 08/25/2021.     SIGNIFICANT DIAGNOSTIC EXAMS  TODAY  08-11-21: ct head:  1. Slightly increased hypoattenuation in the right thalamus. This could represent the sequela of advanced chronic microvascular ischemic disease, but acute infarct is difficult to exclude. An MRI could further evaluate if clinically indicated. 2. No acute hemorrhage. 3. Remote right frontoparietal infarct and similar advanced chronic microvascular ischemic disease. An MRI could provide more sensitive evaluation for acute infarct.  08-11-21: cta of head and neck:  1. Multifocal  severe stenosis of the right vertebral artery in the neck with occlusion at its dural margin and largely occluded intradurally. 2. Severe distal A3/A4 ACA stenosis bilaterally with intermittent/poor distal opacification. 3. Suspected severe bilateral M2 MCA stenosis, although evaluation is limited by motion. 4. Mild-to-moderate bilateral paraclinoid ICA stenosis. 5. Moderate to severe left P2 PCA stenosis. 6. Bilateral carotid bifurcation with proximally 50% right and 40% left proximal ICA stenosis. 7. Small (1-2 mm) outpouching at the right proximal basilar artery is suspicious for an aneurysm, although retrograde flow into the occluded distal right vertebral artery is a differential consideration. 8. Approximately 1.8 cm ground-glass opacity in the imaged right upper lobe and 4 mm area consolidation. These areas may be infectious/inflammatory; however, recommend follow-up CT chest in 3-6 months to ensure resolution and exclude malignancy. If areas persist, subsequent management will be based upon the most suspicious nodule(s).   08-11-21: chest x-ray: Minimal bibasilar atelectasis   08-12-21: mri of brain:  Bland appearing right thalamic hematoma. There is a background of advanced chronic small vessel disease and remote hypertensive pattern hemorrhages.  NO NEW EXAMS.   LABS REVIEWED:   08-11-21; wbc 9.3; hgb 11.4; hct 35.5; mcv 88.8 plt 236; glucose 75; bun 21; creat 1.06; k+ 4.0; na++ 140; ca 9.5; GFR 50; liver normal albumin 4.0 08-12-21: hgb a1c 5.4; chol 115; ldl 64.9 trig 424; hdl 20 08-15-21: wbc 4.9; hgb 8.4; hct 25.5; mcv 85.0 plt 165; glucose 110; bun 13; creat 1.02 ;k+ 3.5; na++ 138; ca 9.1 GFR 52 08-16-21; wbc 4.8; hgb 8.9; hct 26.6; mcv 84.7 plt 189; glucose 123; bun 17; creat 1.04; k+ 3.7; na++ 139; ca 9.1; GFR 51  NO NEW LABS   Review of Systems  Reason unable to perform ROS: nonverbal.   Physical Exam Constitutional:      General: She is not in acute distress.     Appearance: She is well-developed. She is not diaphoretic.  Eyes:     Comments: Gaze deviation to left   Neck:     Thyroid: No thyromegaly.  Cardiovascular:     Rate and Rhythm: Normal rate and regular rhythm.     Heart sounds: Normal heart sounds.  Pulmonary:     Effort: Pulmonary effort is normal. No respiratory distress.     Breath sounds: Normal breath sounds.  Abdominal:     General: Bowel sounds are normal. There is no distension.     Palpations: Abdomen is soft.     Tenderness: There is no abdominal tenderness.  Musculoskeletal:     Cervical back: Neck supple.     Right lower leg: No edema.     Left  lower leg: No edema.     Comments: Left leg contracted Right arm contracted    Lymphadenopathy:     Cervical: No cervical adenopathy.  Skin:    General: Skin is warm and dry.  Neurological:     Mental Status: She is alert. Mental status is at baseline.  Psychiatric:        Mood and Affect: Mood normal.      ASSESSMENT/ PLAN:   TODAY  Alzheimer's disease: she is continuing to lose weight: will begin remeron 7.5 mg nightly for 30 days and will monitor her status.   Ok Edwards NP [facility]   call [phone]

## 2021-08-28 ENCOUNTER — Non-Acute Institutional Stay: Payer: Self-pay | Admitting: Nurse Practitioner

## 2021-08-28 ENCOUNTER — Encounter: Payer: Self-pay | Admitting: Nurse Practitioner

## 2021-08-28 DIAGNOSIS — I639 Cerebral infarction, unspecified: Secondary | ICD-10-CM | POA: Diagnosis not present

## 2021-08-28 DIAGNOSIS — Z515 Encounter for palliative care: Secondary | ICD-10-CM | POA: Diagnosis not present

## 2021-08-28 DIAGNOSIS — R5381 Other malaise: Secondary | ICD-10-CM

## 2021-08-28 DIAGNOSIS — R630 Anorexia: Secondary | ICD-10-CM

## 2021-08-28 DIAGNOSIS — G8929 Other chronic pain: Secondary | ICD-10-CM | POA: Diagnosis not present

## 2021-08-28 DIAGNOSIS — M549 Dorsalgia, unspecified: Secondary | ICD-10-CM | POA: Diagnosis not present

## 2021-08-28 NOTE — Progress Notes (Signed)
[place] Consult Note Telephone: [phone]  Fax: [phone]    Date of encounter: 08/28/21 9:30 PM PATIENT NAME: Julia [name] Julia [name] [id number]   [phone] (home)  DOB: 1927-11-09 MRN: [id number] PRIMARY CARE PROVIDER:    [facility]  RESPONSIBLE PARTY:    Contact Information     Name Relation Home Work Mobile   Julia [name] Relative [phone]  [phone]   Julia [name]   [phone]   Julia "Terie Purser" Son [phone]        I met face to face with patient in facility. Palliative Care was asked to follow this patient by consultation request of  [facility] to address advance care planning and complex medical decision making. This is a follow up visit.                                  ASSESSMENT AND PLAN / RECOMMENDATIONS:  Symptom Management/Plan: 1. Advance Care Planning; DNR   2. Goals of Care: Goals include to maximize quality of life and symptom management. Our advance care planning conversation included a discussion about:    The value and importance of advance care planning  Exploration of personal, cultural or spiritual beliefs that might influence medical decisions  Exploration of goals of care in the event of a sudden injury or illness  Identification and preparation of a healthcare agent  Review and updating or creation of an advance directive document.   3. Anorexia; decreased appetite since cva; will continue to encourage to eat, supplement if needed.    4. Debility secondary to late onset cva; still establishing new baseline. PT/OT working with Julia [name], prior to cva speaking, now speech, processing is very difficult to understand, encourage mobility with assistance at baseline w/c dependent. Fall risk  5. Back pain will continue with current regimen of scheduled tylenol which does provide relief, will continue to monitor   6. Palliative care encounter;  Palliative care encounter; Palliative medicine team will continue to support patient, patient's family, and medical team. Visit consisted of counseling and education dealing with the complex and emotionally intense issues of symptom management and palliative care in the setting of serious and potentially life-threatening illness  7. f/u 2 weeks for ongoing monitoring chronic disease progression, ongoing discussions complex medical decision making  Follow up Palliative Care Visit: Palliative care will continue to follow for complex medical decision making, advance care planning, and clarification of goals. Return 2 weeks with palliative RN or prn.  I spent 46 minutes providing this consultation. More than 50% of the time in this consultation was spent in counseling and care coordination. PPS: 40%  Chief Complaint: Follow up palliative consult for complex medical decision making  HISTORY OF PRESENT ILLNESS:  Julia [name] is a 88 y.o. year old female  with multiple medical problems including Alzheimer's dementia, HTN, DM. Hospitalized 07/18/2021 to 07/20/2021 for baseline left-sided weakness due to prior stroke.  She is admitted for further stroke work-up.  Seen by neurology.  MRI brain was unrevealing for acute stroke.  Readmitted 08/11/2021 to 08/17/2021 for Stroke.  -Post TNKase CT 2/10 @ 1458 - Interval acute hemorrhage within the RIGHT thalamus extending into internal capsule measuring 2.7 x 1.9 x 1.6 cm. -Repeat Head CT 2/10 @ 2100- No significant interval change in size of right thalamic intraparenchymal hemorrhage, Minimal localized edema without significant regional mass  effect. No intraventricular extension or other complicating features. -CTA head & neck- Multifocal severe stenosis of the right vertebral artery in the neck with occlusion at its dural margin and largely occluded intradurally. Severe distal A3/A4 ACA stenosis bilaterally with intermittent/poor distal opacification. Suspected severe  bilateral M2 MCA stenosis, although evaluation is limited by motion. Moderate to severe left P2 PCA stenosis. Small (1-2 mm) outpouching at the right proximal basilar artery -MRI- Bland appearing right thalamic hematoma. Remote parasagittal right parietal cortex infarct. Small remote right cerebellar infarct. remote hypertensive pattern hemorrhages. -2D Echo LVEF 60-65%  Ms [name] was discharged to [facility] where she currently resides. Julia [name] requires assistance for transfers, adls including bathing, dressing, toileting, tray set up and feeding. Staff endorses no other changes. Julia [name] was sitting up in w/c, she made eye contact, attempted to be interactive though difficulty understanding. Julia [name] speech was slow, mumbled with some clear words. Julia [name] was able to follow commands. Julia [name] complained of back pain, staff administered scheduled tylenol with relief. Julia [name] was cooperative with assessment, medical goals reviewed. Will continue current plan monitoring closely through therapy to see about improvements, if declines will explore option of hospice with family as recommended on d/c summary. Updated staff. Emotional support provided. Will contact family for update, review goc  History obtained from review of EMR, discussion with facility staff and Julia [name].  I reviewed available labs, medications, imaging, studies and related documents from the EMR.  Records reviewed and summarized above.   ROS 10 point system reviewed with staff as Julia [name] is cognitively impaired all negative except HPI  Physical Exam: Constitutional: NAD General: frail appearing, debilitated female EYES: lids intact ENMT: oral mucous membranes moist CV: S1S2, RRR, +BLE edema Pulmonary: LCTA, no increased work of breathing, no cough, room air Abdomen: normo-active BS + 4 quadrants, soft and non tender MSK: w/c; lift Skin: warm and dry Neuro:  + generalized weakness,  + cognitive  impairment Psych: flat affect, A and Oriented to person Thank you for the opportunity to participate in the care of Julia [name].  The palliative care team will continue to follow. Please call our office at [phone] if we can be of additional assistance.   Cassia Fein Ihor Gully, NP

## 2021-08-30 DEATH — deceased

## 2021-09-01 ENCOUNTER — Encounter: Payer: Self-pay | Admitting: Adult Health

## 2021-09-01 ENCOUNTER — Non-Acute Institutional Stay (SKILLED_NURSING_FACILITY): Payer: Medicare Other | Admitting: Adult Health

## 2021-09-01 DIAGNOSIS — E1122 Type 2 diabetes mellitus with diabetic chronic kidney disease: Secondary | ICD-10-CM | POA: Diagnosis not present

## 2021-09-01 DIAGNOSIS — R627 Adult failure to thrive: Secondary | ICD-10-CM | POA: Insufficient documentation

## 2021-09-01 DIAGNOSIS — F339 Major depressive disorder, recurrent, unspecified: Secondary | ICD-10-CM | POA: Diagnosis not present

## 2021-09-01 DIAGNOSIS — N183 Chronic kidney disease, stage 3 unspecified: Secondary | ICD-10-CM

## 2021-09-01 DIAGNOSIS — G309 Alzheimer's disease, unspecified: Secondary | ICD-10-CM

## 2021-09-01 DIAGNOSIS — F0280 Dementia in other diseases classified elsewhere without behavioral disturbance: Secondary | ICD-10-CM | POA: Diagnosis not present

## 2021-09-01 NOTE — Progress Notes (Signed)
?Location:  [place] ?Nursing Home Room Number: 133-P ?Place of Service:  SNF (31) ? ? ?CODE STATUS: DNR ? ?Allergies  ?Allergen Reactions  ? Sulfa Antibiotics Rash  ?  Other reaction(s): Other (See Comments) ?  ? ? ?Chief Complaint  ?Patient presents with  ? Acute Visit  ?  Care plan meeting  ? ? ?HPI: ? ?We have come together her care plan meeting.family present.  BIMS and mood unable to do. She requires extensive to dependence care for her adl care. She is incontinent of bladder and bowel. She is nonambulatory has had no falls. Dietary  mech soft with pureed meats with gravy; weight is 153 pounds has poor appetite taking ensure. Therapy requires: max assist with upper and lower body; toileting; standup life for transfers bed mobility max assist .   She continues to be followed for her chronic illnesses including: Alzheimer's disease  CKD stage 3 due to type 2 diabetes mellitus Major depression recurrent chronic  Failure to thrive in adult ? ?Past Medical History:  ?Diagnosis Date  ? Alzheimer disease ([place])   ? Anxiety   ? CVA (cerebral vascular accident) [facility])   ? Hypertension   ? ? ?History reviewed. No pertinent surgical history. ? ?Social History  ? ?Socioeconomic History  ? Marital status: Widowed  ?  Spouse name: Not on file  ? Number of children: Not on file  ? Years of education: Not on file  ? Highest education level: Not on file  ?Occupational History  ? Not on file  ?Tobacco Use  ? Smoking status: Unknown  ? Smokeless tobacco: Not on file  ?Substance and Sexual Activity  ? Alcohol use: Not on file  ? Drug use: Not on file  ? Sexual activity: Not on file  ?Other Topics Concern  ? Not on file  ?Social History Narrative  ? Not on file  ? ?Social Determinants of Health  ? ?Financial Resource Strain: Not on file  ?Food Insecurity: Not on file  ?Transportation Needs: Not on file  ?Physical Activity: Not on file  ?Stress: Not on file  ?Social Connections: Not on file  ?Intimate Partner Violence:  Not on file  ? ?History reviewed. No pertinent family history. ? ? ? ?VITAL SIGNS ?BP 130/65   Pulse 79   Temp (!) 97.5 ?F (36.4 ?C)   Resp 18   Ht 5\' 6"  (1.676 m)   Wt 150 lb 3.2 oz (68.1 kg)   SpO2 95%   BMI 24.24 kg/m?  ? ?Outpatient Encounter Medications as of 09/01/2021  ?Medication Sig  ? acetaminophen (TYLENOL) 500 MG tablet Take 500 mg by mouth 2 (two) times daily.  ? amLODipine (NORVASC) 5 MG tablet Take 1 tablet (5 mg total) by mouth daily.  ? Personal assistant [facility]) OINT Special Instructions: Apply to sacrum, bilateral buttocks, coccyx qshift for prevention. ?Every Shift  ? cetirizine (ZYRTEC) 10 MG tablet Take 10 mg by mouth daily.  ? fosfomycin (MONUROL) 3 g PACK Take 3 g by mouth every other day.  ? lisinopril (ZESTRIL) 40 MG tablet Take 40 mg by mouth daily.  ? loperamide (IMODIUM A-D) 2 MG tablet Take 2 mg by mouth 4 (four) times daily as needed for diarrhea or loose stools.  ? metFORMIN (GLUCOPHAGE) 500 MG tablet Take 500 mg by mouth 2 (two) times daily.  ? NON FORMULARY Diet: Dysphagia 3 diet with dysphagia 2 meats. Gravy with meats if possible. Thin liquids.  ? Nutritional Supplements (ENSURE ENLIVE  PO) Take by mouth. 120 ml BID due to poor meal intake  ?Twice A Day Between Meals  ? omeprazole (PRILOSEC) 20 MG capsule Take 20 mg by mouth daily.  ? ondansetron (ZOFRAN-ODT) 4 MG disintegrating tablet Take 1 tablet by mouth every 8 (eight) hours as needed for nausea or vomiting.  ? sertraline (ZOLOFT) 50 MG tablet Take 50 mg by mouth daily.  ? TUMS 500 MG chewable tablet Chew 2 tablets by mouth 3 (three) times daily as needed for heartburn or indigestion.  ? [DISCONTINUED] LORazepam (ATIVAN) 0.5 MG tablet Take 0.5 mg by mouth daily as needed for anxiety.  ? [DISCONTINUED] pravastatin (PRAVACHOL) 20 MG tablet Take 1 tablet (20 mg total) by mouth daily at 6 PM.  ? ?No facility-administered encounter medications on file as of 09/01/2021.  ? ? ? ?SIGNIFICANT DIAGNOSTIC  EXAMS ? ?TODAY ? ?08-11-21: ct head:  ?1. Slightly increased hypoattenuation in the right thalamus. This could represent the sequela of advanced chronic microvascular ischemic disease, but acute infarct is difficult to exclude. An MRI could further evaluate if clinically indicated. ?2. No acute hemorrhage. ?3. Remote right frontoparietal infarct and similar advanced chronic microvascular ischemic disease. An MRI could provide more sensitive evaluation for acute infarct. ? ?08-11-21: cta of head and neck:  ?1. Multifocal severe stenosis of the right vertebral artery in the neck with occlusion at its dural margin and largely occluded intradurally. ?2. Severe distal A3/A4 ACA stenosis bilaterally with intermittent/poor distal opacification. ?3. Suspected severe bilateral M2 MCA stenosis, although evaluation is limited by motion. ?4. Mild-to-moderate bilateral paraclinoid ICA stenosis. ?5. Moderate to severe left P2 PCA stenosis. ?6. Bilateral carotid bifurcation with proximally 50% right and 40% left proximal ICA stenosis. ?7. Small (1-2 mm) outpouching at the right proximal basilar artery is suspicious for an aneurysm, although retrograde flow into the occluded distal right vertebral artery is a differential consideration. ?8. Approximately 1.8 cm ground-glass opacity in the imaged right upper lobe and 4 mm area consolidation. These areas may be infectious/inflammatory; however, recommend follow-up CT chest in 3-6 months to ensure resolution and exclude malignancy. If areas persist, subsequent management will be based upon the most suspicious nodule(s).  ? ?08-11-21: chest x-ray: Minimal bibasilar atelectasis  ? ?08-12-21: mri of brain:  ?Bland appearing right thalamic hematoma. There is a background of advanced chronic small vessel disease and remote hypertensive pattern hemorrhages. ? ?NO NEW EXAMS.  ? ?LABS REVIEWED:  ? ?08-11-21; wbc 9.3; hgb 11.4; hct 35.5; mcv 88.8 plt 236; glucose 75; bun 21; creat 1.06; k+ 4.0;  na++ 140; ca 9.5; GFR 50; liver normal albumin 4.0 ?08-12-21: hgb a1c 5.4; chol 115; ldl 64.9 trig 424; hdl 20 ?08-15-21: wbc 4.9; hgb 8.4; hct 25.5; mcv 85.0 plt 165; glucose 110; bun 13; creat 1.02 ;k+ 3.5; na++ 138; ca 9.1 GFR 52 ?08-16-21; wbc 4.8; hgb 8.9; hct 26.6; mcv 84.7 plt 189; glucose 123; bun 17; creat 1.04; k+ 3.7; na++ 139; ca 9.1; GFR 51 ? ?NO NEW LABS  ? ?Review of Systems  ?Reason unable to perform ROS: nonverbal.  ? ? ?Physical Exam ?Constitutional:   ?   General: She is not in acute distress. ?   Appearance: She is well-developed. She is not diaphoretic.  ?Neck:  ?   Thyroid: No thyromegaly.  ?Cardiovascular:  ?   Rate and Rhythm: Normal rate and regular rhythm.  ?   Pulses: Normal pulses.  ?   Heart sounds: Normal heart sounds.  ?Pulmonary:  ?  Effort: Pulmonary effort is normal. No respiratory distress.  ?   Breath sounds: Normal breath sounds.  ?Abdominal:  ?   General: Bowel sounds are normal. There is no distension.  ?   Palpations: Abdomen is soft.  ?   Tenderness: There is no abdominal tenderness.  ?Musculoskeletal:  ?   Cervical back: Neck supple.  ?   Right lower leg: No edema.  ?   Left lower leg: No edema.  ?   Comments:  Left leg contracted ?Right arm contracted     ?Lymphadenopathy:  ?   Cervical: No cervical adenopathy.  ?Skin: ?   General: Skin is warm and dry.  ?Neurological:  ?   Mental Status: She is alert. Mental status is at baseline.  ?Psychiatric:     ?   Mood and Affect: Mood normal.  ? ? ? ? ?ASSESSMENT/ PLAN: ? ?TODAY ? ?Alzheimer's disease  ?CKD stage 3 due to type 2 diabetes mellitus ?Major depression recurrent chronic ?Failure to thrive in adult ? ?Will continue current medications ?Will continue current plan of care ?Will monitor her status ?Goal of her care is long term care  ? ?Time spent with patient 40 minutes: medications; care plan therapy  ? ? ?Ok Edwards NP ?[facility]  ? call [phone]  ? ?

## 2021-09-03 DIAGNOSIS — I69328 Other speech and language deficits following cerebral infarction: Secondary | ICD-10-CM | POA: Diagnosis not present

## 2021-09-03 DIAGNOSIS — I69398 Other sequelae of cerebral infarction: Secondary | ICD-10-CM | POA: Diagnosis not present

## 2021-09-03 DIAGNOSIS — I69321 Dysphasia following cerebral infarction: Secondary | ICD-10-CM | POA: Diagnosis not present

## 2021-09-04 DIAGNOSIS — I69321 Dysphasia following cerebral infarction: Secondary | ICD-10-CM | POA: Diagnosis not present

## 2021-09-04 DIAGNOSIS — I69398 Other sequelae of cerebral infarction: Secondary | ICD-10-CM | POA: Diagnosis not present

## 2021-09-04 DIAGNOSIS — I69328 Other speech and language deficits following cerebral infarction: Secondary | ICD-10-CM | POA: Diagnosis not present

## 2021-09-05 DIAGNOSIS — I69398 Other sequelae of cerebral infarction: Secondary | ICD-10-CM | POA: Diagnosis not present

## 2021-09-05 DIAGNOSIS — I69328 Other speech and language deficits following cerebral infarction: Secondary | ICD-10-CM | POA: Diagnosis not present

## 2021-09-05 DIAGNOSIS — I69321 Dysphasia following cerebral infarction: Secondary | ICD-10-CM | POA: Diagnosis not present

## 2021-09-06 DIAGNOSIS — I69398 Other sequelae of cerebral infarction: Secondary | ICD-10-CM | POA: Diagnosis not present

## 2021-09-06 DIAGNOSIS — I69328 Other speech and language deficits following cerebral infarction: Secondary | ICD-10-CM | POA: Diagnosis not present

## 2021-09-06 DIAGNOSIS — I69321 Dysphasia following cerebral infarction: Secondary | ICD-10-CM | POA: Diagnosis not present

## 2021-09-07 DIAGNOSIS — I69328 Other speech and language deficits following cerebral infarction: Secondary | ICD-10-CM | POA: Diagnosis not present

## 2021-09-07 DIAGNOSIS — I69398 Other sequelae of cerebral infarction: Secondary | ICD-10-CM | POA: Diagnosis not present

## 2021-09-07 DIAGNOSIS — I69321 Dysphasia following cerebral infarction: Secondary | ICD-10-CM | POA: Diagnosis not present

## 2021-09-08 ENCOUNTER — Non-Acute Institutional Stay: Payer: Self-pay

## 2021-09-08 VITALS — BP 148/60 | HR 83 | Temp 97.6°F | Resp 22

## 2021-09-08 NOTE — Progress Notes (Unsigned)
PATIENT NAME: Julia [name] DOB: 1928/06/10 MRN: [id number]  PRIMARY CARE PROVIDER: Gerlene Fee, NP  RESPONSIBLE PARTY:  Acct ID - Guarantor Home Phone Work Phone Relationship Acct Type  [id number] Julia [name] [phone]  Self P/F     [place], [place] [address]    PLAN OF CARE and INTERVENTIONS:               1.  GOALS OF CARE/ ADVANCE CARE PLANNING:  Goals include to maximize quality of life and symptom management               2.  PATIENT/CAREGIVER EDUCATION:  No family at the bedside.               4. PERSONAL EMERGENCY PLAN:  Activate 911 for emergencies.                5.  DISEASE STATUS:  Spoke with Ola, LPN for patient.  She is uncertain if patient continues to be followed by therapy at this time.  Reviewed chart and patient likely has 1-2 weeks of therapy left per orders.  Increase weakness reported by staff.  Using sit to stand lift to occasionally transfer patient.  Patient is wheelchair bound and requires max assistance with ADL's.  Patient is found in the bed resting quietly.  Awakens easily to verbal stimulation.  NAD noted.    HISTORY OF PRESENT ILLNESS:  Julia [name] is a 88 y.o. year old female  with multiple medical problems including Alzheimer's dementia, HTN, DM. Hospitalized 07/18/2021 to 07/20/2021 for baseline left-sided weakness due to prior stroke.  She is admitted for further stroke work-up.  Seen by neurology.  MRI brain was unrevealing for acute stroke.  Readmitted 08/11/2021 to 08/17/2021 for Stroke.  Patient is being followed by Palliative Care every 4-8 weeks and PRN.   CODE STATUS: DNR ADVANCED DIRECTIVES: Yes MOST FORM: No PPS: 30%   PHYSICAL EXAM:   VITALS: Today's Vitals   09/08/21 1645  BP: (!) 148/60  Pulse: 83  Resp: (!) 22  Temp: 97.6 F (36.4 C)  SpO2: 95%    LUNGS: clear to auscultation  CARDIAC: Cor RRR}  EXTREMITIES: - for edema SKIN: Skin color, texture, turgor normal. No rashes or lesions or mobility and turgor  normal  NEURO: positive for gait problems, memory problems, and weakness       Lorenza Burton, RN

## 2021-09-10 DIAGNOSIS — I69328 Other speech and language deficits following cerebral infarction: Secondary | ICD-10-CM | POA: Diagnosis not present

## 2021-09-10 DIAGNOSIS — I69321 Dysphasia following cerebral infarction: Secondary | ICD-10-CM | POA: Diagnosis not present

## 2021-09-10 DIAGNOSIS — I69398 Other sequelae of cerebral infarction: Secondary | ICD-10-CM | POA: Diagnosis not present

## 2021-09-11 ENCOUNTER — Non-Acute Institutional Stay (SKILLED_NURSING_FACILITY): Payer: Medicare Other | Admitting: Adult Health

## 2021-09-11 ENCOUNTER — Encounter: Payer: Self-pay | Admitting: Adult Health

## 2021-09-11 DIAGNOSIS — J309 Allergic rhinitis, unspecified: Secondary | ICD-10-CM | POA: Diagnosis not present

## 2021-09-11 DIAGNOSIS — F339 Major depressive disorder, recurrent, unspecified: Secondary | ICD-10-CM | POA: Diagnosis not present

## 2021-09-11 NOTE — Progress Notes (Signed)
? ?Location:  [place] ?Nursing Home Room Number: 892 ?Place of Service:  SNF (31) ? ? ?CODE STATUS: dnr ? ?Allergies  ?Allergen Reactions  ? Sulfa Antibiotics Rash  ?  Other reaction(s): Other (See Comments) ?  ? ? ?Chief Complaint  ?Patient presents with  ? Acute Visit  ?  Lethargy  ? ? ?HPI: ? ?Her family feels as though she is too sleepy. Her appetite has decreased; her family states that her lethargy is interfering with her appetite. Her family is concerned that her zoloft dose may be too high. They are wondering if the zoloft should be lowered or stopped. She is also taking zyrtec in the AM as well. Both medications can cause lethargy. For most people depression occurs with CVA. She is benefiting from the zoloft will change them to the evening time to see if this helps.  ? ?Past Medical History:  ?Diagnosis Date  ? Alzheimer disease ([place])   ? Anxiety   ? CVA (cerebral vascular accident) [facility])   ? Hypertension   ? ? ?No past surgical history on file. ? ?Social History  ? ?Socioeconomic History  ? Marital status: Widowed  ?  Spouse name: Not on file  ? Number of children: Not on file  ? Years of education: Not on file  ? Highest education level: Not on file  ?Occupational History  ? Not on file  ?Tobacco Use  ? Smoking status: Unknown  ? Smokeless tobacco: Not on file  ?Substance and Sexual Activity  ? Alcohol use: Not on file  ? Drug use: Not on file  ? Sexual activity: Not on file  ?Other Topics Concern  ? Not on file  ?Social History Narrative  ? Not on file  ? ?Social Determinants of Health  ? ?Financial Resource Strain: Not on file  ?Food Insecurity: Not on file  ?Transportation Needs: Not on file  ?Physical Activity: Not on file  ?Stress: Not on file  ?Social Connections: Not on file  ?Intimate Partner Violence: Not on file  ? ?No family history on file. ? ? ? ?VITAL SIGNS ?BP 135/72   Pulse 85   Temp 97.6 ?F (36.4 ?C)   Resp 20   Ht '5\' 6"'$  (1.676 m)   Wt 143 lb 12.8 oz (65.2 kg)   BMI  23.21 kg/m?  ? ?Outpatient Encounter Medications as of 09/11/2021  ?Medication Sig  ? acetaminophen (TYLENOL) 500 MG tablet Take 500 mg by mouth 2 (two) times daily.  ? amLODipine (NORVASC) 5 MG tablet Take 1 tablet (5 mg total) by mouth daily.  ? Personal assistant [facility]) OINT Special Instructions: Apply to sacrum, bilateral buttocks, coccyx qshift for prevention. ?Every Shift  ? cetirizine (ZYRTEC) 10 MG tablet Take 10 mg by mouth daily.  ? fosfomycin (MONUROL) 3 g PACK Take 3 g by mouth every other day.  ? lisinopril (ZESTRIL) 40 MG tablet Take 40 mg by mouth daily.  ? loperamide (IMODIUM A-D) 2 MG tablet Take 2 mg by mouth 4 (four) times daily as needed for diarrhea or loose stools.  ? metFORMIN (GLUCOPHAGE) 500 MG tablet Take 500 mg by mouth 2 (two) times daily.  ? NON FORMULARY Diet: Dysphagia 3 diet with dysphagia 2 meats. Gravy with meats if possible. Thin liquids.  ? Nutritional Supplements (ENSURE ENLIVE PO) Take by mouth. 120 ml BID due to poor meal intake  ?Twice A Day Between Meals  ? omeprazole (PRILOSEC) 20 MG capsule Take 20 mg by mouth  daily.  ? ondansetron (ZOFRAN-ODT) 4 MG disintegrating tablet Take 1 tablet by mouth every 8 (eight) hours as needed for nausea or vomiting.  ? sertraline (ZOLOFT) 50 MG tablet Take 50 mg by mouth daily.  ? TUMS 500 MG chewable tablet Chew 2 tablets by mouth 3 (three) times daily as needed for heartburn or indigestion.  ? ?No facility-administered encounter medications on file as of 09/11/2021.  ? ? ? ?SIGNIFICANT DIAGNOSTIC EXAMS ? ? ?TODAY ? ?08-11-21: ct head:  ?1. Slightly increased hypoattenuation in the right thalamus. This could represent the sequela of advanced chronic microvascular ischemic disease, but acute infarct is difficult to exclude. An MRI could further evaluate if clinically indicated. ?2. No acute hemorrhage. ?3. Remote right frontoparietal infarct and similar advanced chronic microvascular ischemic disease. An MRI could provide more sensitive  evaluation for acute infarct. ? ?08-11-21: cta of head and neck:  ?1. Multifocal severe stenosis of the right vertebral artery in the neck with occlusion at its dural margin and largely occluded intradurally. ?2. Severe distal A3/A4 ACA stenosis bilaterally with intermittent/poor distal opacification. ?3. Suspected severe bilateral M2 MCA stenosis, although evaluation is limited by motion. ?4. Mild-to-moderate bilateral paraclinoid ICA stenosis. ?5. Moderate to severe left P2 PCA stenosis. ?6. Bilateral carotid bifurcation with proximally 50% right and 40% left proximal ICA stenosis. ?7. Small (1-2 mm) outpouching at the right proximal basilar artery is suspicious for an aneurysm, although retrograde flow into the occluded distal right vertebral artery is a differential consideration. ?8. Approximately 1.8 cm ground-glass opacity in the imaged right upper lobe and 4 mm area consolidation. These areas may be infectious/inflammatory; however, recommend follow-up CT chest in 3-6 months to ensure resolution and exclude malignancy. If areas persist, subsequent management will be based upon the most suspicious nodule(s).  ? ?08-11-21: chest x-ray: Minimal bibasilar atelectasis  ? ?08-12-21: mri of brain:  ?Bland appearing right thalamic hematoma. There is a background of advanced chronic small vessel disease and remote hypertensive pattern hemorrhages. ? ?NO NEW EXAMS.  ? ?LABS REVIEWED:  ? ?08-11-21; wbc 9.3; hgb 11.4; hct 35.5; mcv 88.8 plt 236; glucose 75; bun 21; creat 1.06; k+ 4.0; na++ 140; ca 9.5; GFR 50; liver normal albumin 4.0 ?08-12-21: hgb a1c 5.4; chol 115; ldl 64.9 trig 424; hdl 20 ?08-15-21: wbc 4.9; hgb 8.4; hct 25.5; mcv 85.0 plt 165; glucose 110; bun 13; creat 1.02 ;k+ 3.5; na++ 138; ca 9.1 GFR 52 ?08-16-21; wbc 4.8; hgb 8.9; hct 26.6; mcv 84.7 plt 189; glucose 123; bun 17; creat 1.04; k+ 3.7; na++ 139; ca 9.1; GFR 51 ? ?NO NEW LABS  ? ?Review of Systems  ?Reason unable to perform ROS: aphasia.  ? ? ?Physical  Exam ?Constitutional:   ?   General: She is not in acute distress. ?   Appearance: She is well-developed. She is not diaphoretic.  ?Neck:  ?   Thyroid: No thyromegaly.  ?Cardiovascular:  ?   Rate and Rhythm: Normal rate and regular rhythm.  ?   Pulses: Normal pulses.  ?   Heart sounds: Normal heart sounds.  ?Pulmonary:  ?   Effort: Pulmonary effort is normal. No respiratory distress.  ?   Breath sounds: Normal breath sounds.  ?Abdominal:  ?   General: Bowel sounds are normal. There is no distension.  ?   Palpations: Abdomen is soft.  ?   Tenderness: There is no abdominal tenderness.  ?Musculoskeletal:  ?   Cervical back: Neck supple.  ?   Right lower  leg: No edema.  ?   Left lower leg: No edema.  ?   Comments:   Left leg contracted ?Right arm contracted      ?Lymphadenopathy:  ?   Cervical: No cervical adenopathy.  ?Skin: ?   General: Skin is warm and dry.  ?Neurological:  ?   Mental Status: She is alert. Mental status is at baseline.  ?Psychiatric:     ?   Mood and Affect: Mood normal.  ? ? ? ? ?ASSESSMENT/ PLAN: ? ?TODAY ? ?Chronic allergic rhinitis ?Major depression recurrent chronic ? ?Will change both zyrtec and zoloft to the PM hours ?Will monitor her status.  ? ? ?Ok Edwards NP ?[facility]  ? call [phone]  ? ?

## 2021-09-14 ENCOUNTER — Encounter: Payer: Self-pay | Admitting: Adult Health

## 2021-09-14 ENCOUNTER — Non-Acute Institutional Stay (SKILLED_NURSING_FACILITY): Payer: Medicare Other | Admitting: Adult Health

## 2021-09-14 DIAGNOSIS — E785 Hyperlipidemia, unspecified: Secondary | ICD-10-CM

## 2021-09-14 DIAGNOSIS — E1169 Type 2 diabetes mellitus with other specified complication: Secondary | ICD-10-CM

## 2021-09-14 DIAGNOSIS — I69328 Other speech and language deficits following cerebral infarction: Secondary | ICD-10-CM | POA: Diagnosis not present

## 2021-09-14 DIAGNOSIS — F0280 Dementia in other diseases classified elsewhere without behavioral disturbance: Secondary | ICD-10-CM | POA: Diagnosis not present

## 2021-09-14 DIAGNOSIS — I69321 Dysphasia following cerebral infarction: Secondary | ICD-10-CM | POA: Diagnosis not present

## 2021-09-14 DIAGNOSIS — I63412 Cerebral infarction due to embolism of left middle cerebral artery: Secondary | ICD-10-CM | POA: Diagnosis not present

## 2021-09-14 DIAGNOSIS — N183 Chronic kidney disease, stage 3 unspecified: Secondary | ICD-10-CM

## 2021-09-14 DIAGNOSIS — E1122 Type 2 diabetes mellitus with diabetic chronic kidney disease: Secondary | ICD-10-CM

## 2021-09-14 DIAGNOSIS — I69398 Other sequelae of cerebral infarction: Secondary | ICD-10-CM | POA: Diagnosis not present

## 2021-09-14 DIAGNOSIS — G309 Alzheimer's disease, unspecified: Secondary | ICD-10-CM | POA: Diagnosis not present

## 2021-09-14 DIAGNOSIS — Z66 Do not resuscitate: Secondary | ICD-10-CM | POA: Diagnosis not present

## 2021-09-14 NOTE — Progress Notes (Signed)
?Location:  [place] ?Nursing Home Room Number: 142-D ?Place of Service:  SNF (31) ? ? ?CODE STATUS: DNR ? ?Allergies  ?Allergen Reactions  ? Sulfa Antibiotics Rash  ?  Other reaction(s): Other (See Comments) ?  ? ? ?Chief Complaint  ?Patient presents with  ? Medical Management of Chronic Issues  ?                      Cerebrovascular acident (CVA) due to embolism of left middle cerebral artery:  Alzheimer's disease:  CKD stable 3 due to type 2 diabetes mellitus:  Hyperlipidemia associated with type 2 diabetes mellitus:  ? ? ?HPI: ? ?She is a 88 year old long term resident of this facility being seen for the management of her chronic illnesses: Cerebrovascular acident (CVA) due to embolism of left middle cerebral artery:  Alzheimer's disease:  CKD stable 3 due to type 2 diabetes mellitus:  Hyperlipidemia associated with type 2 diabetes mellitus. There are no reports of pain present. There are no reports of anxiety or agitation.  ? ?Past Medical History:  ?Diagnosis Date  ? Alzheimer disease ([place])   ? Anxiety   ? CVA (cerebral vascular accident) [facility])   ? Hypertension   ? ? ?History reviewed. No pertinent surgical history. ? ?Social History  ? ?Socioeconomic History  ? Marital status: Widowed  ?  Spouse name: Not on file  ? Number of children: Not on file  ? Years of education: Not on file  ? Highest education level: Not on file  ?Occupational History  ? Not on file  ?Tobacco Use  ? Smoking status: Unknown  ? Smokeless tobacco: Not on file  ?Vaping Use  ? Vaping Use: Never used  ?Substance and Sexual Activity  ? Alcohol use: Not on file  ? Drug use: Not on file  ? Sexual activity: Not on file  ?Other Topics Concern  ? Not on file  ?Social History Narrative  ? Not on file  ? ?Social Determinants of Health  ? ?Financial Resource Strain: Not on file  ?Food Insecurity: Not on file  ?Transportation Needs: Not on file  ?Physical Activity: Not on file  ?Stress: Not on file  ?Social Connections: Not on file   ?Intimate Partner Violence: Not on file  ? ?History reviewed. No pertinent family history. ? ? ? ?VITAL SIGNS ?BP 135/72   Pulse 85   Temp 97.6 ?F (36.4 ?C)   Resp 20   Ht '5\' 6"'$  (1.676 m)   Wt 143 lb 12.8 oz (65.2 kg)   SpO2 95%   BMI 23.21 kg/m?  ? ?Outpatient Encounter Medications as of 09/14/2021  ?Medication Sig  ? acetaminophen (TYLENOL) 500 MG tablet Take 500 mg by mouth 2 (two) times daily.  ? amLODipine (NORVASC) 5 MG tablet Take 1 tablet (5 mg total) by mouth daily.  ? Personal assistant [facility]) OINT Special Instructions: Apply to sacrum, bilateral buttocks, coccyx qshift for prevention. ?Every Shift  ? cetirizine (ZYRTEC) 10 MG tablet Take 10 mg by mouth daily.  ? fosfomycin (MONUROL) 3 g PACK Take 3 g by mouth every other day.  ? lisinopril (ZESTRIL) 40 MG tablet Take 40 mg by mouth daily.  ? loperamide (IMODIUM A-D) 2 MG tablet Take 2 mg by mouth 4 (four) times daily as needed for diarrhea or loose stools.  ? metFORMIN (GLUCOPHAGE) 500 MG tablet Take 500 mg by mouth 2 (two) times daily.  ? NON FORMULARY Diet: Dysphagia 3  diet with dysphagia 2 meats. Gravy with meats if possible. Thin liquids.  ? Nutritional Supplements (ENSURE ENLIVE PO) Take by mouth. 120 ml BID due to poor meal intake  ?Twice A Day Between Meals  ? omeprazole (PRILOSEC) 20 MG capsule Take 20 mg by mouth daily.  ? ondansetron (ZOFRAN-ODT) 4 MG disintegrating tablet Take 1 tablet by mouth every 8 (eight) hours as needed for nausea or vomiting.  ? sertraline (ZOLOFT) 50 MG tablet Take 50 mg by mouth daily.  ? TUMS 500 MG chewable tablet Chew 2 tablets by mouth 3 (three) times daily as needed for heartburn or indigestion.  ? ?No facility-administered encounter medications on file as of 09/14/2021.  ? ? ? ?SIGNIFICANT DIAGNOSTIC EXAMS ? ? ?PREVIOUS  ? ?08-11-21: ct head:  ?1. Slightly increased hypoattenuation in the right thalamus. This could represent the sequela of advanced chronic microvascular ischemic disease, but acute  infarct is difficult to exclude. An MRI could further evaluate if clinically indicated. ?2. No acute hemorrhage. ?3. Remote right frontoparietal infarct and similar advanced chronic microvascular ischemic disease. An MRI could provide more sensitive evaluation for acute infarct. ? ?08-11-21: cta of head and neck:  ?1. Multifocal severe stenosis of the right vertebral artery in the neck with occlusion at its dural margin and largely occluded intradurally. ?2. Severe distal A3/A4 ACA stenosis bilaterally with intermittent/poor distal opacification. ?3. Suspected severe bilateral M2 MCA stenosis, although evaluation is limited by motion. ?4. Mild-to-moderate bilateral paraclinoid ICA stenosis. ?5. Moderate to severe left P2 PCA stenosis. ?6. Bilateral carotid bifurcation with proximally 50% right and 40% left proximal ICA stenosis. ?7. Small (1-2 mm) outpouching at the right proximal basilar artery is suspicious for an aneurysm, although retrograde flow into the occluded distal right vertebral artery is a differential consideration. ?8. Approximately 1.8 cm ground-glass opacity in the imaged right upper lobe and 4 mm area consolidation. These areas may be infectious/inflammatory; however, recommend follow-up CT chest in 3-6 months to ensure resolution and exclude malignancy. If areas persist, subsequent management will be based upon the most suspicious nodule(s).  ? ?08-11-21: chest x-ray: Minimal bibasilar atelectasis  ? ?08-12-21: mri of brain:  ?Bland appearing right thalamic hematoma. There is a background of advanced chronic small vessel disease and remote hypertensive pattern hemorrhages. ? ?NO NEW EXAMS.  ? ?LABS REVIEWED:  ? ?08-11-21; wbc 9.3; hgb 11.4; hct 35.5; mcv 88.8 plt 236; glucose 75; bun 21; creat 1.06; k+ 4.0; na++ 140; ca 9.5; GFR 50; liver normal albumin 4.0 ?08-12-21: hgb a1c 5.4; chol 115; ldl 64.9 trig 424; hdl 20 ?08-15-21: wbc 4.9; hgb 8.4; hct 25.5; mcv 85.0 plt 165; glucose 110; bun 13; creat 1.02  ;k+ 3.5; na++ 138; ca 9.1 GFR 52 ?08-16-21; wbc 4.8; hgb 8.9; hct 26.6; mcv 84.7 plt 189; glucose 123; bun 17; creat 1.04; k+ 3.7; na++ 139; ca 9.1; GFR 51 ? ?NO NEW LABS  ? ?Review of Systems  ?Reason unable to perform ROS: aphasia.  ? ?Physical Exam ?Constitutional:   ?   General: She is not in acute distress. ?   Appearance: She is well-developed. She is not diaphoretic.  ?Neck:  ?   Thyroid: No thyromegaly.  ?Cardiovascular:  ?   Rate and Rhythm: Normal rate and regular rhythm.  ?   Pulses: Normal pulses.  ?   Heart sounds: Normal heart sounds.  ?Pulmonary:  ?   Effort: Pulmonary effort is normal. No respiratory distress.  ?   Breath sounds: Normal breath sounds.  ?  Abdominal:  ?   General: Bowel sounds are normal. There is no distension.  ?   Palpations: Abdomen is soft.  ?   Tenderness: There is no abdominal tenderness.  ?Musculoskeletal:  ?   Cervical back: Neck supple.  ?   Right lower leg: No edema.  ?   Left lower leg: No edema.  ?   Comments: Left leg contracted ?Right arm contracted       ?Lymphadenopathy:  ?   Cervical: No cervical adenopathy.  ?Skin: ?   General: Skin is warm and dry.  ?Neurological:  ?   Mental Status: She is alert. Mental status is at baseline.  ?Psychiatric:     ?   Mood and Affect: Mood normal.  ? ? ? ?ASSESSMENT/ PLAN: ? ?TODAY  ? ?Cerebrovascular acident (CVA) due to embolism of left middle cerebral artery: is stable; will monitor her status.  ? ?2. Alzheimer's disease: no change: is nonverbal; weight is 143; pounds will monitor  ? ?3. CKD stable 3 due to type 2 diabetes mellitus: stable: bun 17; creat 1.04 GFR 51  ? ?4.  Hyperlipidemia associated with type 2 diabetes mellitus: trig 424; LDL 64.9 will continue pravachol 20 mg daily  ?  ?PREVIOUS  ? ?5. Hypertension associated with stage 3 chronic kidney disease due to type 2 diabetes mellitus: 150/63 will continue norvasc 5 mg daily lisinopril 40 mg daily  ? ?6. Controlled type diabetes mellitus with hyperglycemia without long term  current use of insulin: hgb a1c 5.4; will stop glipizide and will continue metformin 500 mg twice daily  ? ?7. Normochromic normocytic anemia: hgb 8.9 will monitor  ? ?8. Chronic UTI: will continue monurol 3 gm ev

## 2021-09-15 DIAGNOSIS — I69328 Other speech and language deficits following cerebral infarction: Secondary | ICD-10-CM | POA: Diagnosis not present

## 2021-09-15 DIAGNOSIS — I69321 Dysphasia following cerebral infarction: Secondary | ICD-10-CM | POA: Diagnosis not present

## 2021-09-15 DIAGNOSIS — I69398 Other sequelae of cerebral infarction: Secondary | ICD-10-CM | POA: Diagnosis not present

## 2021-09-16 DIAGNOSIS — I69398 Other sequelae of cerebral infarction: Secondary | ICD-10-CM | POA: Diagnosis not present

## 2021-09-16 DIAGNOSIS — I69321 Dysphasia following cerebral infarction: Secondary | ICD-10-CM | POA: Diagnosis not present

## 2021-09-16 DIAGNOSIS — I69328 Other speech and language deficits following cerebral infarction: Secondary | ICD-10-CM | POA: Diagnosis not present

## 2021-09-17 DIAGNOSIS — I69398 Other sequelae of cerebral infarction: Secondary | ICD-10-CM | POA: Diagnosis not present

## 2021-09-17 DIAGNOSIS — I69328 Other speech and language deficits following cerebral infarction: Secondary | ICD-10-CM | POA: Diagnosis not present

## 2021-09-17 DIAGNOSIS — I69321 Dysphasia following cerebral infarction: Secondary | ICD-10-CM | POA: Diagnosis not present

## 2021-09-18 DIAGNOSIS — I69321 Dysphasia following cerebral infarction: Secondary | ICD-10-CM | POA: Diagnosis not present

## 2021-09-18 DIAGNOSIS — I69328 Other speech and language deficits following cerebral infarction: Secondary | ICD-10-CM | POA: Diagnosis not present

## 2021-09-18 DIAGNOSIS — I69398 Other sequelae of cerebral infarction: Secondary | ICD-10-CM | POA: Diagnosis not present

## 2021-09-19 DIAGNOSIS — I69398 Other sequelae of cerebral infarction: Secondary | ICD-10-CM | POA: Diagnosis not present

## 2021-09-19 DIAGNOSIS — I69328 Other speech and language deficits following cerebral infarction: Secondary | ICD-10-CM | POA: Diagnosis not present

## 2021-09-19 DIAGNOSIS — I69321 Dysphasia following cerebral infarction: Secondary | ICD-10-CM | POA: Diagnosis not present

## 2021-09-20 ENCOUNTER — Encounter: Payer: Self-pay | Admitting: Adult Health

## 2021-09-20 ENCOUNTER — Non-Acute Institutional Stay (SKILLED_NURSING_FACILITY): Payer: Medicare Other | Admitting: Adult Health

## 2021-09-20 DIAGNOSIS — G309 Alzheimer's disease, unspecified: Secondary | ICD-10-CM

## 2021-09-20 DIAGNOSIS — F0280 Dementia in other diseases classified elsewhere without behavioral disturbance: Secondary | ICD-10-CM | POA: Diagnosis not present

## 2021-09-20 DIAGNOSIS — R627 Adult failure to thrive: Secondary | ICD-10-CM

## 2021-09-20 NOTE — Progress Notes (Signed)
?Location:  [place] ?Nursing Home Room Number: 142-D ?Place of Service:  SNF (31) ? ? ?CODE STATUS: DNR ? ?Allergies  ?Allergen Reactions  ? Sulfa Antibiotics Rash  ?  Other reaction(s): Other (See Comments) ?  ? ? ?Chief Complaint  ?Patient presents with  ? Acute Visit  ?  Change in status   ? ? ?HPI: ? ?She is less responsive; is not taking anything by mouth. There are no indications of pain; no signs of aspiration present. According to her MOST form she is not to be hospitalized, it is ok for IVF for a limited amount of time.  She does have a history of stroke and alzheimers.  ? ?Past Medical History:  ?Diagnosis Date  ? Alzheimer disease ([place])   ? Anxiety   ? CVA (cerebral vascular accident) [facility])   ? Hypertension   ? ? ?History reviewed. No pertinent surgical history. ? ?Social History  ? ?Socioeconomic History  ? Marital status: Widowed  ?  Spouse name: Not on file  ? Number of children: Not on file  ? Years of education: Not on file  ? Highest education level: Not on file  ?Occupational History  ? Not on file  ?Tobacco Use  ? Smoking status: Unknown  ? Smokeless tobacco: Not on file  ?Vaping Use  ? Vaping Use: Never used  ?Substance and Sexual Activity  ? Alcohol use: Not Currently  ? Drug use: Never  ? Sexual activity: Not on file  ?Other Topics Concern  ? Not on file  ?Social History Narrative  ? Not on file  ? ?Social Determinants of Health  ? ?Financial Resource Strain: Not on file  ?Food Insecurity: Not on file  ?Transportation Needs: Not on file  ?Physical Activity: Not on file  ?Stress: Not on file  ?Social Connections: Not on file  ?Intimate Partner Violence: Not on file  ? ?History reviewed. No pertinent family history. ? ? ? ?VITAL SIGNS ?BP 137/79   Pulse 92   Temp 97.6 ?F (36.4 ?C)   Resp 20   Ht '5\' 6"'$  (1.676 m)   Wt 139 lb (63 kg)   SpO2 95%   BMI 22.44 kg/m?  ? ?Outpatient Encounter Medications as of 09/20/2021  ?Medication Sig  ? acetaminophen (TYLENOL) 500 MG tablet Take 500  mg by mouth 2 (two) times daily.  ? amLODipine (NORVASC) 5 MG tablet Take 1 tablet (5 mg total) by mouth daily.  ? Personal assistant [facility]) OINT Special Instructions: Apply to sacrum, bilateral buttocks, coccyx qshift for prevention. ?Every Shift  ? fosfomycin (MONUROL) 3 g PACK Take 3 g by mouth every other day.  ? loperamide (IMODIUM A-D) 2 MG tablet Take 2 mg by mouth 4 (four) times daily as needed for diarrhea or loose stools.  ? NON FORMULARY Diet: Dysphagia 3 diet with dysphagia 2 meats. Gravy with meats if possible. Thin liquids.  ? Nutritional Supplements (ENSURE ENLIVE PO) Take by mouth. 120 ml BID due to poor meal intake  ?Twice A Day Between Meals  ? ondansetron (ZOFRAN-ODT) 4 MG disintegrating tablet Take 1 tablet by mouth every 8 (eight) hours as needed for nausea or vomiting.  ? TUMS 500 MG chewable tablet Chew 2 tablets by mouth 3 (three) times daily as needed for heartburn or indigestion.  ? [DISCONTINUED] cetirizine (ZYRTEC) 10 MG tablet Take 10 mg by mouth daily.  ? [DISCONTINUED] lisinopril (ZESTRIL) 40 MG tablet Take 40 mg by mouth daily.  ? [DISCONTINUED] metFORMIN (  GLUCOPHAGE) 500 MG tablet Take 500 mg by mouth 2 (two) times daily.  ? [DISCONTINUED] omeprazole (PRILOSEC) 20 MG capsule Take 20 mg by mouth daily.  ? [DISCONTINUED] sertraline (ZOLOFT) 50 MG tablet Take 50 mg by mouth daily.  ? ?No facility-administered encounter medications on file as of 09/20/2021.  ? ? ? ?SIGNIFICANT DIAGNOSTIC EXAMS ? ?PREVIOUS  ? ?08-11-21: ct head:  ?1. Slightly increased hypoattenuation in the right thalamus. This could represent the sequela of advanced chronic microvascular ischemic disease, but acute infarct is difficult to exclude. An MRI could further evaluate if clinically indicated. ?2. No acute hemorrhage. ?3. Remote right frontoparietal infarct and similar advanced chronic microvascular ischemic disease. An MRI could provide more sensitive evaluation for acute infarct. ? ?08-11-21: cta of head  and neck:  ?1. Multifocal severe stenosis of the right vertebral artery in the neck with occlusion at its dural margin and largely occluded intradurally. ?2. Severe distal A3/A4 ACA stenosis bilaterally with intermittent/poor distal opacification. ?3. Suspected severe bilateral M2 MCA stenosis, although evaluation is limited by motion. ?4. Mild-to-moderate bilateral paraclinoid ICA stenosis. ?5. Moderate to severe left P2 PCA stenosis. ?6. Bilateral carotid bifurcation with proximally 50% right and 40% left proximal ICA stenosis. ?7. Small (1-2 mm) outpouching at the right proximal basilar artery is suspicious for an aneurysm, although retrograde flow into the occluded distal right vertebral artery is a differential consideration. ?8. Approximately 1.8 cm ground-glass opacity in the imaged right upper lobe and 4 mm area consolidation. These areas may be infectious/inflammatory; however, recommend follow-up CT chest in 3-6 months to ensure resolution and exclude malignancy. If areas persist, subsequent management will be based upon the most suspicious nodule(s).  ? ?08-11-21: chest x-ray: Minimal bibasilar atelectasis  ? ?08-12-21: mri of brain:  ?Bland appearing right thalamic hematoma. There is a background of advanced chronic small vessel disease and remote hypertensive pattern hemorrhages. ? ?NO NEW EXAMS.  ? ?LABS REVIEWED:  ? ?08-11-21; wbc 9.3; hgb 11.4; hct 35.5; mcv 88.8 plt 236; glucose 75; bun 21; creat 1.06; k+ 4.0; na++ 140; ca 9.5; GFR 50; liver normal albumin 4.0 ?08-12-21: hgb a1c 5.4; chol 115; ldl 64.9 trig 424; hdl 20 ?08-15-21: wbc 4.9; hgb 8.4; hct 25.5; mcv 85.0 plt 165; glucose 110; bun 13; creat 1.02 ;k+ 3.5; na++ 138; ca 9.1 GFR 52 ?08-16-21; wbc 4.8; hgb 8.9; hct 26.6; mcv 84.7 plt 189; glucose 123; bun 17; creat 1.04; k+ 3.7; na++ 139; ca 9.1; GFR 51 ? ?NO NEW LABS  ? ? ?PREVIOUS  ? ?08-11-21: ct head:  ?1. Slightly increased hypoattenuation in the right thalamus. This could represent the sequela  of advanced chronic microvascular ischemic disease, but acute infarct is difficult to exclude. An MRI could further evaluate if clinically indicated. ?2. No acute hemorrhage. ?3. Remote right frontoparietal infarct and similar advanced chronic microvascular ischemic disease. An MRI could provide more sensitive evaluation for acute infarct. ? ?08-11-21: cta of head and neck:  ?1. Multifocal severe stenosis of the right vertebral artery in the neck with occlusion at its dural margin and largely occluded intradurally. ?2. Severe distal A3/A4 ACA stenosis bilaterally with intermittent/poor distal opacification. ?3. Suspected severe bilateral M2 MCA stenosis, although evaluation is limited by motion. ?4. Mild-to-moderate bilateral paraclinoid ICA stenosis. ?5. Moderate to severe left P2 PCA stenosis. ?6. Bilateral carotid bifurcation with proximally 50% right and 40% left proximal ICA stenosis. ?7. Small (1-2 mm) outpouching at the right proximal basilar artery is suspicious for an aneurysm, although retrograde  flow into the occluded distal right vertebral artery is a differential consideration. ?8. Approximately 1.8 cm ground-glass opacity in the imaged right upper lobe and 4 mm area consolidation. These areas may be infectious/inflammatory; however, recommend follow-up CT chest in 3-6 months to ensure resolution and exclude malignancy. If areas persist, subsequent management will be based upon the most suspicious nodule(s).  ? ?08-11-21: chest x-ray: Minimal bibasilar atelectasis  ? ?08-12-21: mri of brain:  ?Bland appearing right thalamic hematoma. There is a background of advanced chronic small vessel disease and remote hypertensive pattern hemorrhages. ? ?NO NEW EXAMS.  ? ?LABS REVIEWED:  ? ?08-11-21; wbc 9.3; hgb 11.4; hct 35.5; mcv 88.8 plt 236; glucose 75; bun 21; creat 1.06; k+ 4.0; na++ 140; ca 9.5; GFR 50; liver normal albumin 4.0 ?08-12-21: hgb a1c 5.4; chol 115; ldl 64.9 trig 424; hdl 20 ?08-15-21: wbc 4.9; hgb  8.4; hct 25.5; mcv 85.0 plt 165; glucose 110; bun 13; creat 1.02 ;k+ 3.5; na++ 138; ca 9.1 GFR 52 ?08-16-21; wbc 4.8; hgb 8.9; hct 26.6; mcv 84.7 plt 189; glucose 123; bun 17; creat 1.04; k+ 3.7; na++ 139; ca 9

## 2021-09-21 ENCOUNTER — Encounter: Payer: Self-pay | Admitting: Adult Health

## 2021-09-21 ENCOUNTER — Non-Acute Institutional Stay (SKILLED_NURSING_FACILITY): Payer: Medicare Other | Admitting: Adult Health

## 2021-09-21 ENCOUNTER — Other Ambulatory Visit (HOSPITAL_COMMUNITY)
Admission: RE | Admit: 2021-09-21 | Discharge: 2021-09-21 | Disposition: A | Discharge status: Home / Self Care | Payer: Medicare Other | Source: Skilled Nursing Facility | Attending: Adult Health | Admitting: Adult Health

## 2021-09-21 ENCOUNTER — Other Ambulatory Visit: Payer: Self-pay | Admitting: Adult Health

## 2021-09-21 DIAGNOSIS — R627 Adult failure to thrive: Secondary | ICD-10-CM

## 2021-09-21 DIAGNOSIS — I129 Hypertensive chronic kidney disease with stage 1 through stage 4 chronic kidney disease, or unspecified chronic kidney disease: Secondary | ICD-10-CM | POA: Insufficient documentation

## 2021-09-21 DIAGNOSIS — G309 Alzheimer's disease, unspecified: Secondary | ICD-10-CM | POA: Diagnosis not present

## 2021-09-21 DIAGNOSIS — F0280 Dementia in other diseases classified elsewhere without behavioral disturbance: Secondary | ICD-10-CM | POA: Diagnosis not present

## 2021-09-21 LAB — BASIC METABOLIC PANEL
Anion gap: 12 (ref 5–15)
BUN: 83 mg/dL — ABNORMAL HIGH (ref 8–23)
CO2: 22 mmol/L (ref 22–32)
Calcium: 9.8 mg/dL (ref 8.9–10.3)
Chloride: 123 mmol/L — ABNORMAL HIGH (ref 98–111)
Creatinine, Ser: 1.6 mg/dL — ABNORMAL HIGH (ref 0.44–1.00)
GFR, Estimated: 30 mL/min — ABNORMAL LOW (ref >60)
Glucose, Bld: 128 mg/dL — ABNORMAL HIGH (ref 70–99)
Potassium: 4.3 mmol/L (ref 3.5–5.1)
Sodium: 157 mmol/L — ABNORMAL HIGH (ref 135–145)

## 2021-09-21 LAB — CBC
HCT: 38.1 % (ref 36.0–46.0)
Hemoglobin: 11.5 g/dL — ABNORMAL LOW (ref 12.0–15.0)
MCH: 28.7 pg (ref 26.0–34.0)
MCHC: 30.2 g/dL (ref 30.0–36.0)
MCV: 95 fL (ref 80.0–100.0)
Platelets: 164 10*3/uL (ref 150–400)
RBC: 4.01 MIL/uL (ref 3.87–5.11)
RDW: 15 % (ref 11.5–15.5)
WBC: 6.1 10*3/uL (ref 4.0–10.5)
nRBC: 0 % (ref 0.0–0.2)

## 2021-09-21 MED ORDER — MORPHINE SULFATE (CONCENTRATE) 10 MG /0.5 ML PO SOLN: 5.0000 mg | ORAL | 0 refills | Status: AC | PRN

## 2021-09-21 NOTE — Progress Notes (Signed)
?Location:  [place] ?Nursing Home Room Number: 142-D ?Place of Service:  SNF (31) ? ? ?CODE STATUS: DNR ? ?Allergies  ?Allergen Reactions  ? Sulfa Antibiotics Rash  ?  Other reaction(s): Other (See Comments) ?  ? ? ?Chief Complaint  ?Patient presents with  ? Acute Visit  ?  Labs follow-up  ? ? ?HPI: ? ?She has a na++ level of 157. She is not taking anything by mouth at this time. I have spoken with her family. At this time they do not want further interventions. They do not want to prolong her life if it means there will be any pain present. They do not want IVF. They do wish comfort care.  ? ?Past Medical History:  ?Diagnosis Date  ? Alzheimer disease ([place])   ? Anxiety   ? CVA (cerebral vascular accident) [facility])   ? Hypertension   ? ? ?History reviewed. No pertinent surgical history. ? ?Social History  ? ?Socioeconomic History  ? Marital status: Widowed  ?  Spouse name: Not on file  ? Number of children: Not on file  ? Years of education: Not on file  ? Highest education level: Not on file  ?Occupational History  ? Not on file  ?Tobacco Use  ? Smoking status: Unknown  ? Smokeless tobacco: Not on file  ?Vaping Use  ? Vaping Use: Never used  ?Substance and Sexual Activity  ? Alcohol use: Not Currently  ? Drug use: Never  ? Sexual activity: Not on file  ?Other Topics Concern  ? Not on file  ?Social History Narrative  ? Not on file  ? ?Social Determinants of Health  ? ?Financial Resource Strain: Not on file  ?Food Insecurity: Not on file  ?Transportation Needs: Not on file  ?Physical Activity: Not on file  ?Stress: Not on file  ?Social Connections: Not on file  ?Intimate Partner Violence: Not on file  ? ?History reviewed. No pertinent family history. ? ? ? ?VITAL SIGNS ?BP 137/79   Pulse 92   Temp 97.6 ?F (36.4 ?C)   Resp 20   Ht '5\' 6"'$  (1.676 m)   Wt 139 lb 12.8 oz (63.4 kg)   SpO2 95%   BMI 22.56 kg/m?  ? ?Outpatient Encounter Medications as of 09/21/2021  ?Medication Sig  ? acetaminophen (TYLENOL)  500 MG tablet Take 500 mg by mouth 2 (two) times daily.  ? amLODipine (NORVASC) 5 MG tablet Take 1 tablet (5 mg total) by mouth daily.  ? Personal assistant [facility]) OINT Special Instructions: Apply to sacrum, bilateral buttocks, coccyx qshift for prevention. ?Every Shift  ? fosfomycin (MONUROL) 3 g PACK Take 3 g by mouth every other day.  ? loperamide (IMODIUM A-D) 2 MG tablet Take 2 mg by mouth 4 (four) times daily as needed for diarrhea or loose stools.  ? NON FORMULARY Diet: Dysphagia 3 diet with dysphagia 2 meats. Gravy with meats if possible. Thin liquids.  ? Nutritional Supplements (ENSURE ENLIVE PO) Take by mouth. 120 ml BID due to poor meal intake  ?Twice A Day Between Meals  ? ondansetron (ZOFRAN-ODT) 4 MG disintegrating tablet Take 1 tablet by mouth every 8 (eight) hours as needed for nausea or vomiting.  ? SODIUM CHLORIDE IV parenteral solution; 0.45 %; amt: 100 cc per hour; intravenous ?Special Instructions: for 2 liters only: for acute renal failure ?Every Shift;  ? TUMS 500 MG chewable tablet Chew 2 tablets by mouth 3 (three) times daily as needed for heartburn or  indigestion.  ? ?No facility-administered encounter medications on file as of 09/21/2021.  ? ? ? ?SIGNIFICANT DIAGNOSTIC EXAMS ? ? ?PREVIOUS  ? ?08-11-21: ct head:  ?1. Slightly increased hypoattenuation in the right thalamus. This could represent the sequela of advanced chronic microvascular ischemic disease, but acute infarct is difficult to exclude. An MRI could further evaluate if clinically indicated. ?2. No acute hemorrhage. ?3. Remote right frontoparietal infarct and similar advanced chronic microvascular ischemic disease. An MRI could provide more sensitive evaluation for acute infarct. ? ?08-11-21: cta of head and neck:  ?1. Multifocal severe stenosis of the right vertebral artery in the neck with occlusion at its dural margin and largely occluded intradurally. ?2. Severe distal A3/A4 ACA stenosis bilaterally with  intermittent/poor distal opacification. ?3. Suspected severe bilateral M2 MCA stenosis, although evaluation is limited by motion. ?4. Mild-to-moderate bilateral paraclinoid ICA stenosis. ?5. Moderate to severe left P2 PCA stenosis. ?6. Bilateral carotid bifurcation with proximally 50% right and 40% left proximal ICA stenosis. ?7. Small (1-2 mm) outpouching at the right proximal basilar artery is suspicious for an aneurysm, although retrograde flow into the occluded distal right vertebral artery is a differential consideration. ?8. Approximately 1.8 cm ground-glass opacity in the imaged right upper lobe and 4 mm area consolidation. These areas may be infectious/inflammatory; however, recommend follow-up CT chest in 3-6 months to ensure resolution and exclude malignancy. If areas persist, subsequent management will be based upon the most suspicious nodule(s).  ? ?08-11-21: chest x-ray: Minimal bibasilar atelectasis  ? ?08-12-21: mri of brain:  ?Bland appearing right thalamic hematoma. There is a background of advanced chronic small vessel disease and remote hypertensive pattern hemorrhages. ? ?NO NEW EXAMS.  ? ?LABS REVIEWED:  ? ?08-11-21; wbc 9.3; hgb 11.4; hct 35.5; mcv 88.8 plt 236; glucose 75; bun 21; creat 1.06; k+ 4.0; na++ 140; ca 9.5; GFR 50; liver normal albumin 4.0 ?08-12-21: hgb a1c 5.4; chol 115; ldl 64.9 trig 424; hdl 20 ?08-15-21: wbc 4.9; hgb 8.4; hct 25.5; mcv 85.0 plt 165; glucose 110; bun 13; creat 1.02 ;k+ 3.5; na++ 138; ca 9.1 GFR 52 ?08-16-21; wbc 4.8; hgb 8.9; hct 26.6; mcv 84.7 plt 189; glucose 123; bun 17; creat 1.04; k+ 3.7; na++ 139; ca 9.1; GFR 51 ? ?TODAY ? ?09-21-21: wbc 6.1; hgb 11.5; hct 38.1; mcv 95.0 plt 164; glucose 128; bun 83; creat 1.60; k+ 4.3; na++ 157; ca 9.8; GFR 30  ? ?Review of Systems  ?Unable to perform ROS: Dementia  ? ?Physical Exam ?Constitutional:   ?   General: She is not in acute distress. ?   Appearance: She is well-developed. She is not diaphoretic.  ?Neck:  ?   Thyroid:  No thyromegaly.  ?Cardiovascular:  ?   Rate and Rhythm: Normal rate and regular rhythm.  ?   Pulses: Normal pulses.  ?   Heart sounds: Normal heart sounds.  ?Pulmonary:  ?   Effort: Pulmonary effort is normal. No respiratory distress.  ?   Breath sounds: Normal breath sounds.  ?Abdominal:  ?   General: Bowel sounds are normal. There is no distension.  ?   Palpations: Abdomen is soft.  ?   Tenderness: There is no abdominal tenderness.  ?Musculoskeletal:  ?   Cervical back: Neck supple.  ?   Right lower leg: No edema.  ?   Left lower leg: No edema.  ?Lymphadenopathy:  ?   Cervical: No cervical adenopathy.  ?Skin: ?   General: Skin is warm and dry.  ?Neurological:  ?  Comments: Aware   ? ? ? ?ASSESSMENT/ PLAN: ? ?TODAY ? ?Failure to thrive in adult ?Alzheimer's disease  ?Will stop all medications ?Will begin roxanol 5 mg every 2 hours as needed ?Will continue to provide comfort care.  ? ? ?Ok Edwards NP ?[facility]  ?call [phone]  ? ?

## 2021-09-22 DIAGNOSIS — I69328 Other speech and language deficits following cerebral infarction: Secondary | ICD-10-CM | POA: Diagnosis not present

## 2021-09-22 DIAGNOSIS — I69398 Other sequelae of cerebral infarction: Secondary | ICD-10-CM | POA: Diagnosis not present

## 2021-09-22 DIAGNOSIS — I69321 Dysphasia following cerebral infarction: Secondary | ICD-10-CM | POA: Diagnosis not present

## 2021-09-23 DIAGNOSIS — I69398 Other sequelae of cerebral infarction: Secondary | ICD-10-CM | POA: Diagnosis not present

## 2021-09-23 DIAGNOSIS — I69328 Other speech and language deficits following cerebral infarction: Secondary | ICD-10-CM | POA: Diagnosis not present

## 2021-09-23 DIAGNOSIS — I69321 Dysphasia following cerebral infarction: Secondary | ICD-10-CM | POA: Diagnosis not present

## 2021-09-30 DEATH — deceased

## 2021-11-23 ENCOUNTER — Other Ambulatory Visit: Payer: Self-pay

## 2021-11-30 ENCOUNTER — Other Ambulatory Visit: Payer: Self-pay
